# Patient Record
Sex: Female | Born: 1960
Health system: Southern US, Community
[De-identification: ages and names within clinical notes are randomized; demographics above are authoritative.]

## PROBLEM LIST (undated history)

## (undated) ENCOUNTER — Emergency Department (HOSPITAL_COMMUNITY): Payer: Medicare HMO

## (undated) DIAGNOSIS — M1990 Unspecified osteoarthritis, unspecified site: Secondary | ICD-10-CM

## (undated) DIAGNOSIS — R51 Headache: Secondary | ICD-10-CM

## (undated) DIAGNOSIS — R002 Palpitations: Secondary | ICD-10-CM

## (undated) DIAGNOSIS — Z9889 Other specified postprocedural states: Secondary | ICD-10-CM

## (undated) DIAGNOSIS — T66XXXA Radiation sickness, unspecified, initial encounter: Secondary | ICD-10-CM

## (undated) DIAGNOSIS — F329 Major depressive disorder, single episode, unspecified: Secondary | ICD-10-CM

## (undated) DIAGNOSIS — G8929 Other chronic pain: Secondary | ICD-10-CM

## (undated) DIAGNOSIS — Z87442 Personal history of urinary calculi: Secondary | ICD-10-CM

## (undated) DIAGNOSIS — D649 Anemia, unspecified: Secondary | ICD-10-CM

## (undated) DIAGNOSIS — F32A Depression, unspecified: Secondary | ICD-10-CM

## (undated) DIAGNOSIS — F419 Anxiety disorder, unspecified: Secondary | ICD-10-CM

## (undated) DIAGNOSIS — J45909 Unspecified asthma, uncomplicated: Secondary | ICD-10-CM

## (undated) DIAGNOSIS — M545 Low back pain, unspecified: Secondary | ICD-10-CM

## (undated) DIAGNOSIS — R112 Nausea with vomiting, unspecified: Secondary | ICD-10-CM

## (undated) DIAGNOSIS — R519 Headache, unspecified: Secondary | ICD-10-CM

## (undated) DIAGNOSIS — R011 Cardiac murmur, unspecified: Secondary | ICD-10-CM

## (undated) DIAGNOSIS — Z801 Family history of malignant neoplasm of trachea, bronchus and lung: Secondary | ICD-10-CM

## (undated) DIAGNOSIS — C801 Malignant (primary) neoplasm, unspecified: Secondary | ICD-10-CM

## (undated) DIAGNOSIS — Z9221 Personal history of antineoplastic chemotherapy: Secondary | ICD-10-CM

## (undated) DIAGNOSIS — Z803 Family history of malignant neoplasm of breast: Secondary | ICD-10-CM

## (undated) HISTORY — PX: CARPAL TUNNEL RELEASE: SHX101

## (undated) HISTORY — DX: Depression, unspecified: F32.A

## (undated) HISTORY — DX: Anxiety disorder, unspecified: F41.9

## (undated) HISTORY — DX: Family history of malignant neoplasm of breast: Z80.3

## (undated) HISTORY — PX: OTHER SURGICAL HISTORY: SHX169

## (undated) HISTORY — DX: Family history of malignant neoplasm of trachea, bronchus and lung: Z80.1

## (undated) HISTORY — DX: Major depressive disorder, single episode, unspecified: F32.9

---

## 1991-03-15 HISTORY — PX: TUBAL LIGATION: SHX77

## 1997-09-22 ENCOUNTER — Encounter: Admission: RE | Admit: 1997-09-22 | Discharge: 1997-09-22 | Payer: Self-pay | Admitting: Family Medicine

## 1997-09-27 ENCOUNTER — Emergency Department (HOSPITAL_COMMUNITY): Admission: EM | Admit: 1997-09-27 | Discharge: 1997-09-27 | Payer: Self-pay | Admitting: Emergency Medicine

## 1997-09-29 ENCOUNTER — Encounter: Admission: RE | Admit: 1997-09-29 | Discharge: 1997-09-29 | Payer: Self-pay | Admitting: Family Medicine

## 1997-10-31 ENCOUNTER — Encounter: Admission: RE | Admit: 1997-10-31 | Discharge: 1997-10-31 | Payer: Self-pay | Admitting: Family Medicine

## 1998-10-27 ENCOUNTER — Emergency Department (HOSPITAL_COMMUNITY): Admission: EM | Admit: 1998-10-27 | Discharge: 1998-10-27 | Payer: Self-pay | Admitting: Emergency Medicine

## 1998-10-27 ENCOUNTER — Encounter: Payer: Self-pay | Admitting: *Deleted

## 1998-11-02 ENCOUNTER — Encounter: Admission: RE | Admit: 1998-11-02 | Discharge: 1998-11-02 | Payer: Self-pay | Admitting: Family Medicine

## 1998-11-18 ENCOUNTER — Encounter: Admission: RE | Admit: 1998-11-18 | Discharge: 1998-11-18 | Payer: Self-pay | Admitting: Family Medicine

## 2001-12-08 ENCOUNTER — Emergency Department (HOSPITAL_COMMUNITY): Admission: EM | Admit: 2001-12-08 | Discharge: 2001-12-08 | Payer: Self-pay | Admitting: Emergency Medicine

## 2002-02-12 ENCOUNTER — Encounter: Admission: RE | Admit: 2002-02-12 | Discharge: 2002-05-07 | Payer: Self-pay | Admitting: Occupational Medicine

## 2002-04-26 ENCOUNTER — Encounter: Admission: RE | Admit: 2002-04-26 | Discharge: 2002-04-26 | Payer: Self-pay | Admitting: Family Medicine

## 2002-05-01 ENCOUNTER — Emergency Department (HOSPITAL_COMMUNITY): Admission: EM | Admit: 2002-05-01 | Discharge: 2002-05-02 | Payer: Self-pay | Admitting: Emergency Medicine

## 2002-05-02 ENCOUNTER — Encounter: Payer: Self-pay | Admitting: *Deleted

## 2002-05-06 ENCOUNTER — Ambulatory Visit (HOSPITAL_COMMUNITY): Admission: RE | Admit: 2002-05-06 | Discharge: 2002-05-06 | Payer: Self-pay | Admitting: *Deleted

## 2002-05-06 ENCOUNTER — Encounter: Payer: Self-pay | Admitting: *Deleted

## 2002-05-30 ENCOUNTER — Encounter (INDEPENDENT_AMBULATORY_CARE_PROVIDER_SITE_OTHER): Payer: Self-pay | Admitting: Specialist

## 2002-05-30 ENCOUNTER — Encounter: Admission: RE | Admit: 2002-05-30 | Discharge: 2002-05-30 | Payer: Self-pay | Admitting: Sports Medicine

## 2002-08-30 ENCOUNTER — Encounter: Admission: RE | Admit: 2002-08-30 | Discharge: 2002-08-30 | Payer: Self-pay | Admitting: Family Medicine

## 2002-09-03 ENCOUNTER — Encounter: Admission: RE | Admit: 2002-09-03 | Discharge: 2002-09-03 | Payer: Self-pay | Admitting: Family Medicine

## 2002-09-05 ENCOUNTER — Encounter: Admission: RE | Admit: 2002-09-05 | Discharge: 2002-09-05 | Payer: Self-pay | Admitting: Internal Medicine

## 2004-11-12 ENCOUNTER — Encounter (INDEPENDENT_AMBULATORY_CARE_PROVIDER_SITE_OTHER): Payer: Self-pay | Admitting: *Deleted

## 2004-12-07 ENCOUNTER — Ambulatory Visit: Payer: Self-pay | Admitting: Family Medicine

## 2005-07-13 ENCOUNTER — Encounter: Payer: Self-pay | Admitting: *Deleted

## 2006-05-11 DIAGNOSIS — E669 Obesity, unspecified: Secondary | ICD-10-CM | POA: Insufficient documentation

## 2006-05-11 DIAGNOSIS — J45909 Unspecified asthma, uncomplicated: Secondary | ICD-10-CM

## 2006-05-11 DIAGNOSIS — J309 Allergic rhinitis, unspecified: Secondary | ICD-10-CM | POA: Insufficient documentation

## 2006-05-12 ENCOUNTER — Encounter (INDEPENDENT_AMBULATORY_CARE_PROVIDER_SITE_OTHER): Payer: Self-pay | Admitting: *Deleted

## 2006-07-27 ENCOUNTER — Emergency Department (HOSPITAL_COMMUNITY): Admission: EM | Admit: 2006-07-27 | Discharge: 2006-07-28 | Payer: Self-pay | Admitting: Emergency Medicine

## 2007-04-27 ENCOUNTER — Ambulatory Visit: Payer: Self-pay | Admitting: Family Medicine

## 2007-04-27 ENCOUNTER — Observation Stay (HOSPITAL_COMMUNITY): Admission: EM | Admit: 2007-04-27 | Discharge: 2007-04-28 | Payer: Self-pay | Admitting: Emergency Medicine

## 2007-05-01 ENCOUNTER — Telehealth: Payer: Self-pay | Admitting: Family Medicine

## 2007-05-01 ENCOUNTER — Telehealth (INDEPENDENT_AMBULATORY_CARE_PROVIDER_SITE_OTHER): Payer: Self-pay | Admitting: *Deleted

## 2007-05-03 ENCOUNTER — Encounter: Payer: Self-pay | Admitting: *Deleted

## 2007-05-18 ENCOUNTER — Inpatient Hospital Stay (HOSPITAL_COMMUNITY): Admission: AD | Admit: 2007-05-18 | Discharge: 2007-05-22 | Payer: Self-pay | Admitting: *Deleted

## 2007-05-18 ENCOUNTER — Encounter: Payer: Self-pay | Admitting: Sports Medicine

## 2007-05-18 ENCOUNTER — Ambulatory Visit: Payer: Self-pay | Admitting: *Deleted

## 2007-07-25 ENCOUNTER — Telehealth: Payer: Self-pay | Admitting: Family Medicine

## 2008-05-23 ENCOUNTER — Ambulatory Visit: Payer: Self-pay | Admitting: Family Medicine

## 2008-05-29 ENCOUNTER — Ambulatory Visit (HOSPITAL_COMMUNITY): Admission: RE | Admit: 2008-05-29 | Discharge: 2008-05-29 | Payer: Self-pay | Admitting: Family Medicine

## 2008-09-16 ENCOUNTER — Telehealth: Payer: Self-pay | Admitting: Family Medicine

## 2008-09-16 ENCOUNTER — Telehealth (INDEPENDENT_AMBULATORY_CARE_PROVIDER_SITE_OTHER): Payer: Self-pay | Admitting: *Deleted

## 2008-09-16 ENCOUNTER — Ambulatory Visit: Payer: Self-pay | Admitting: Family Medicine

## 2008-09-16 ENCOUNTER — Ambulatory Visit (HOSPITAL_COMMUNITY): Admission: RE | Admit: 2008-09-16 | Discharge: 2008-09-16 | Payer: Self-pay | Admitting: Family Medicine

## 2008-12-12 ENCOUNTER — Ambulatory Visit: Payer: Self-pay | Admitting: Family Medicine

## 2008-12-17 ENCOUNTER — Encounter (INDEPENDENT_AMBULATORY_CARE_PROVIDER_SITE_OTHER): Payer: Self-pay | Admitting: *Deleted

## 2008-12-17 ENCOUNTER — Ambulatory Visit (HOSPITAL_COMMUNITY): Admission: RE | Admit: 2008-12-17 | Discharge: 2008-12-17 | Payer: Self-pay | Admitting: Family Medicine

## 2008-12-19 ENCOUNTER — Ambulatory Visit: Payer: Self-pay | Admitting: Family Medicine

## 2009-01-08 ENCOUNTER — Telehealth: Payer: Self-pay | Admitting: Family Medicine

## 2009-01-22 ENCOUNTER — Encounter: Admission: RE | Admit: 2009-01-22 | Discharge: 2009-02-19 | Payer: Self-pay | Admitting: Family Medicine

## 2009-01-30 ENCOUNTER — Ambulatory Visit: Payer: Self-pay | Admitting: Family Medicine

## 2009-02-19 ENCOUNTER — Encounter: Payer: Self-pay | Admitting: Family Medicine

## 2009-03-26 ENCOUNTER — Ambulatory Visit: Payer: Self-pay | Admitting: Sports Medicine

## 2009-06-22 ENCOUNTER — Ambulatory Visit: Payer: Self-pay | Admitting: Family Medicine

## 2009-06-22 DIAGNOSIS — F410 Panic disorder [episodic paroxysmal anxiety] without agoraphobia: Secondary | ICD-10-CM

## 2009-06-23 ENCOUNTER — Emergency Department (HOSPITAL_COMMUNITY): Admission: EM | Admit: 2009-06-23 | Discharge: 2009-06-23 | Payer: Self-pay | Admitting: Emergency Medicine

## 2009-06-24 ENCOUNTER — Ambulatory Visit: Payer: Self-pay | Admitting: Family Medicine

## 2009-07-08 ENCOUNTER — Ambulatory Visit: Payer: Self-pay | Admitting: Family Medicine

## 2009-12-11 ENCOUNTER — Encounter: Payer: Self-pay | Admitting: Family Medicine
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
## 2010-04-13 NOTE — Miscellaneous (Signed)
   Clinical Lists Changes  Problems: Changed problem from ASTHMA, UNSPECIFIED (ICD-493.90) to ASTHMA, INTERMITTENT (ICD-493.90) 

## 2010-04-13 NOTE — Assessment & Plan Note (Signed)
Summary: RT SHOULDER,MC   Vital Signs:  Patient profile:   50 year old female BP sitting:   142 / 98  Vitals Entered By: Lillia Pauls CMA (March 26, 2009 9:41 AM)  Primary Provider:  Marisue Ivan  MD   History of Present Illness: 49 yo F here for R shoulder pain.  Patient reports > 3 months of slowly worsening R shoulder pain Denies any acute injury + night pain especially rolling onto shoulder wakes her up No remote issues with this.  No h/o DM Range of motion has slowly worsened over this time Had x-rays showing osteophytes that could contribute to impingement syndrome. Tried tylenol and tramadol. States is compliant with ROM exercises Tried 3 visits of PT but states this caused too much pain and stopped Had subacromial injection that provided maybe 2 days of relief.  Allergies (verified): 1)  ! Aspirin  Physical Exam  General:  VS reviewed.  Well appearing, NAD Msk:  R shoulder: No gross deformity. Inspection: no obvious deformities, no swelling, ecchymosis, or erythema No TTP AC joint. ROM limited to 45 deg ER R compared to 90 deg L.  Only 130 deg flexion, 90 deg abduction - limited with passive motion and guarding to these as well. Pain with empty can.  Strength 5/5 resisted IR/ER. Positive Neers and Hawkins NV intact distally Additional Exam:  MSK u/s: Very difficult exam 2/2 body habitus.  No discernible rotator cuff tear but could not rule this out.  Biceps tendon intact with target sign (fluid around tendon).    After informed verbal consent, patient was seated on the exam table and right shoulder was prepped with alcohol swab.  Using sterile gel, under ultrasound guidance area inferior to acromion was injected via anterior approach with 4:1 marcaine to depomedrol.  Patient tolerated procedure without any immediate complications.   Impression & Recommendations:  Problem # 1:  SHOULDER PAIN, RIGHT (ICD-719.41) Assessment Deteriorated  Impingement +  adhesive capsulitis.  Unable to rule out rotator cuff tear based on body habitus making it difficult for adequate image quality of rotator cuff muscles.  Bursitis was visualized with fluid extending around long head of biceps tendon.  Ultrasound guidance used (see procedure note) to inject under acromion with success.  To continue with codman exercises.  Amitriptyline to help with sleep at bedtime.  F/u in 1 month for reevaluation.  Her updated medication list for this problem includes:    Tramadol Hcl 50 Mg Tabs (Tramadol hcl) .Marland Kitchen... Take 2 pills every 6 hours for pain    Tylenol Extra Strength 500 Mg Tabs (Acetaminophen) .Marland Kitchen... 2 tablet by mouth every 8 hours scheduled  Orders: Joint Aspirate / Injection, Large (20610)  Complete Medication List: 1)  Tramadol Hcl 50 Mg Tabs (Tramadol hcl) .... Take 2 pills every 6 hours for pain 2)  Tylenol Extra Strength 500 Mg Tabs (Acetaminophen) .... 2 tablet by mouth every 8 hours scheduled 3)  Ventolin Hfa 108 (90 Base) Mcg/act Aers (Albuterol sulfate) .... 2 puffs every 4 hours as needed for wheezing 4)  Amitriptyline Hcl 25 Mg Tabs (Amitriptyline hcl) .Marland Kitchen.. 1 tab by mouth at bedtime  Patient Instructions: 1)  Continue doing the Codman exercises (pendulums, wall walking, arm circles). 2)  Try amitriptyline at bedtime. 3)  Follow up with [place] in 1 month. Prescriptions: AMITRIPTYLINE HCL 25 MG TABS (AMITRIPTYLINE HCL) 1 tab by mouth at bedtime  #30 x 1   Entered and Authorized by:   Norton Blizzard MD  Signed by:   Norton Blizzard MD on 03/26/2009   Method used:   Print then Give to Patient   RxID:   [id number]

## 2010-04-13 NOTE — Assessment & Plan Note (Signed)
Summary: Anxiety- started on celexa 20mg    Vital Signs:  Patient profile:   50 year old female Height:      62 inches Weight:      276.0 pounds BMI:     50.66 Temp:     98.6 degrees F oral Pulse rate:   99 / minute BP sitting:   116 / 83  (left arm) Cuff size:   large  Vitals Entered By: Gladstone Pih (June 24, 2009 3:35 PM) CC: panic attacks, F/U ED Is Patient Diabetic? No Pain Assessment Patient in pain? no        Primary Care Provider:  Marisue Ivan  MD  CC:  panic attacks and F/U ED.  History of Present Illness: 50yo F here for f/u of anxiety attack  Anxiety: States that she was seen in clinic 2 days ago and prescribed buspar.  She tried the medication but it was not helpful.  She had another panic attack that night and went to the ER where she received Ativan and states she responded well to the Ativan.  Unsure of what triggers the panic attacks but it is affecting her function.  She reports chest discomfort, sob, feeling of "doom".  Allergic rhinitis: Worsened during this time of the year.  C/O rhinorrhea, itchy eyes, and sore throat with coughing.  Habits & Providers  Alcohol-Tobacco-Diet     Tobacco Status: never  Current Medications (verified): 1)  Tylenol Extra Strength 500 Mg Tabs (Acetaminophen) .... 2 Tablet By Mouth Every 8 Hours Scheduled 2)  Ventolin Hfa 108 (90 Base) Mcg/act Aers (Albuterol Sulfate) .... 2 Puffs Every 4 Hours As Needed For Wheezing 3)  Loratadine 10 Mg Tabs (Loratadine) .... Take One Tab By Mouth Daily 4)  Citalopram Hydrobromide 20 Mg Tabs (Citalopram Hydrobromide) .Marland Kitchen.. 1 Tab By Mouth Daily 5)  Ativan 1 Mg Tabs (Lorazepam) .... 1/2 Tab By Mouth Every 8 Hours As Needed For Anxiety 6)  Flonase 50 Mcg/act Susp (Fluticasone Propionate) .Marland Kitchen.. 1 Spray Each Nostril Daily  Allergies (verified): 1)  ! Aspirin  Review of Systems      See HPI  Physical Exam  General:  VS Reviewed. Well appearing, NAD.  Psych:  Pt does not appear  anxious A&O x3 good eye contact no psychomotor activity   Impression & Recommendations:  Problem # 1:  PANIC ATTACK (ICD-300.01) Assessment Unchanged  Hx and exam c/w anxiety with breakthrough panic attacks. Plan to start on Celexa 20mg  daily with ativan for bridging with plans to stop the Ativan in 2 weeks. Will f/u in 2 weeks.  The following medications were removed from the medication list:    Amitriptyline Hcl 25 Mg Tabs (Amitriptyline hcl) .Marland Kitchen... 1 tab by mouth at bedtime    Buspirone Hcl 10 Mg Tabs (Buspirone hcl) .Marland Kitchen... Take 1/2 tab by mouth three times a day for anxiety Her updated medication list for this problem includes:    Citalopram Hydrobromide 20 Mg Tabs (Citalopram hydrobromide) .Marland Kitchen... 1 tab by mouth daily    Ativan 1 Mg Tabs (Lorazepam) .Marland Kitchen... 1/2 tab by mouth every 8 hours as needed for anxiety  Orders: [facility]- Est Level  3 (62831)  Problem # 2:  RHINITIS, ALLERGIC (ICD-477.9) Assessment: Deteriorated  Symptoms currently worsen. Will add flonase to her regimen.  Her updated medication list for this problem includes:    Loratadine 10 Mg Tabs (Loratadine) .Marland Kitchen... Take one tab by mouth daily    Flonase 50 Mcg/act Susp (Fluticasone propionate) .Marland Kitchen... 1 spray each nostril  daily  Orders: [facility]- Est Level  3 (99213)  Problem # 3:  ROTATOR CUFF SYNDROME (ICD-726.10) Assessment: Improved States that she did not f/u with Sports Med b/c her symptoms resolved.  Complete Medication List: 1)  Tylenol Extra Strength 500 Mg Tabs (Acetaminophen) .... 2 tablet by mouth every 8 hours scheduled 2)  Ventolin Hfa 108 (90 Base) Mcg/act Aers (Albuterol sulfate) .... 2 puffs every 4 hours as needed for wheezing 3)  Loratadine 10 Mg Tabs (Loratadine) .... Take one tab by mouth daily 4)  Citalopram Hydrobromide 20 Mg Tabs (Citalopram hydrobromide) .Marland Kitchen.. 1 tab by mouth daily 5)  Ativan 1 Mg Tabs (Lorazepam) .... 1/2 tab by mouth every 8 hours as needed for anxiety 6)  Flonase 50 Mcg/act Susp  (Fluticasone propionate) .Marland Kitchen.. 1 spray each nostril daily  Patient Instructions: 1)  Please schedule a follow-up appointment in 2 weeks to reassess anxiety. 2)  I started you on celexa today and also continue the ativan for a short period of time. 3)  I also started you back on flonase. Prescriptions: FLONASE 50 MCG/ACT SUSP (FLUTICASONE PROPIONATE) 1 spray each nostril daily  #1 x 1   Entered and Authorized by:   Marisue Ivan  MD   Signed by:   Marisue Ivan  MD on 06/24/2009   Method used:   Print then Give to Patient   RxID:   [id number] ATIVAN 1 MG TABS (LORAZEPAM) 1/2 tab by mouth every 8 hours as needed for anxiety  #30 x 0   Entered and Authorized by:   Marisue Ivan  MD   Signed by:   Marisue Ivan  MD on 06/24/2009   Method used:   Print then Give to Patient   RxID:   [id number] CITALOPRAM HYDROBROMIDE 20 MG TABS (CITALOPRAM HYDROBROMIDE) 1 tab by mouth daily  #30 x 1   Entered and Authorized by:   Marisue Ivan  MD   Signed by:   Marisue Ivan  MD on 06/24/2009   Method used:   Print then Give to Patient   RxID:   [id number]
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
## 2010-04-26 ENCOUNTER — Encounter: Payer: Self-pay | Admitting: *Deleted

## 2010-07-27 NOTE — H&P (Signed)
[name], [name]   MEDICAL RECORD NO.:  [id number]          PATIENT TYPE:  IPS   LOCATION:  0307                          FACILITY:  [facility]   PHYSICIAN:  Jasmine Pang, M.D. DATE OF BIRTH:  [birth year]   DATE OF ADMISSION:  05/18/2007  DATE OF DISCHARGE:                       PSYCHIATRIC ADMISSION ASSESSMENT   NOTE:  This is a voluntary admission to the services of Jasmine Pang, M.D.  Today's date is May 19, 2007.   IDENTIFYING INFORMATION:  This is a 50 year old married [place] American  female who presented yesterday reporting that she was having suicidal  ideation.  She states that on February 13 she received a call from a  teenager whom the patient helped raise.  This is a neighborhood child.  She lives 2 doors up.  The caller reported that she had had an affair  with her husband for the last several years.  He does have a history for  infidelity.  Indeed, this is how she and her husband met.  They have  been married 33 years.  He was previously married to her sister and she  began a relationship with him at age 29.  The patient reports she is  having difficulty adjusting to all this information.  She kicked him out  of the home yesterday.  She reported yesterday she was having suicidal  ideation with thoughts of overdosing.  She has had chest pain.  Her  cardiac labs were abnormal.  Further tests were pending.  She had not  actually harmed herself.  The patient was actually admitted on February  13 and February 14.  She was having chest pain at that time.  She has a  very positive family history for coronary artery disease and the  anniversary of her mother's death was pending.  Her mother's anniversary  was actually February 16.  She said that she was having tightness while  untying her shoe.  It was not relieved by rest.  It was not aggravated  by anything.  It came and went.  When she was hospitalized her workup  proved to be  atypical cardiac chest pain and it was resolved and it was  unlikely to be cardiac in nature.   PAST PSYCHIATRIC HISTORY:  She does not have any.  She denies any prior  therapy or treatment.  She prefers to not take medication if at all  possible.   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  She is studying to get a degree in health care  administration.  She lives with her husband of 33 years.  Three  children, ages 40, 59 and 30 and the two older ones are also at college.  She does not smoke.  She does not use drugs or alcohol.  She is a  M[employer]on.   FAMILY HISTORY:  Her father died at age 79 of emphysema.  He had  diabetes and congestive heart failure as well.  Her mother died of lung  cancer at  43.  Her mother also had an MI at age 51 and also was known to  have high blood pressure.  She has one sister who has had breast cancer  and also had angioplasty in her 59s.   ALCOHOL AND DRUG HISTORY:  She reports that there are many weekend  alcoholics in the family.   PRIMARY CARE PHYSICIAN:  Her primary care Kristina Rivera is Kristina Rivera,  M.D. at [facility].   MEDICATIONS:  She does have an albuterol inhaler p.r.n.  She has no  other prescribed medications.   DRUG ALLERGIES:  ASPIRIN gives her hives.  NSAIDS give her hives and  anaphylaxis.   PHYSICAL EXAMINATION:  GENERAL:  She is a well-developed, well-nourished  obese African American female who appears her stated age of 77.  VITAL SIGNS:  Shows she is 5 feet 2 inches, weighs 260.  Temperature is  98.8, blood pressure 117/85 to 125/77, pulse is 80-94, respirations are  20.   Today she denies any acute symptoms.  She is status post a tubal  ligation 15 years ago, carpal tunnel release 15 years ago.  She states  she tries to use relaxation therapy when she has an asthma attack.   Labs were not repeated today as she is status post a recent stress test  and  recent labs.  Her lab work yesterday was noncardiac in nature.   MENTAL STATUS EXAM:  Today she is alert and oriented.  She is casually  groomed and dressed in her pajamas.  She appears to be adequately  nourished.  Her speech is of normal rate, rhythm and tone.  Her mood is  depressed.  Her affect is congruent.  Thought processes are clear,  rational and goal oriented.  She just wants to get through the pain.  Judgment and insight are good.  Concentration and memory are good.  Intelligence is at least average.  She is not actively suicidal but she  is afraid that she would still do anything to get rid of this pain,  emotional pain.  She is not homicidal.  She does not have auditory  visual hallucinations.   AXIS I.  Adjustment disorder with depressed mood.  AXIS II.  Deferred.  AXIS III.  Obesity, asthma, noncardiac atypical chest pain, history for  iron deficiency anemia.  AXIS IV.  Severe marital issues.  Husband has been sexually active with  a now 51 year old but it started when she was 4.  AXIS V.  29.   PLAN:  The plan is to admit for safety and stabilization.  We will start  an antianxiety/antidepressant.  Toward that end we will start Lexapro 10  mg p.o. q. a.m.  We will allow her to have Neurontin 100 mg p.o. q.3 h.  p.r.n. anxiety.  We will have the counselor speak with her about setting  up therapy once discharged.  Estimated length of stay is 3 days.      Kristina Rivera, P.A.-C.      Jasmine Pang, M.D.  Electronically Signed    MD/MEDQ  D:  05/19/2007  T:  05/19/2007  Job:  [id number]

## 2010-07-27 NOTE — Discharge Summary (Signed)
NAMEJACARRA, BOBAK                ACCOUNT NO.:  [id number]   MEDICAL RECORD NO.:  [id number]          PATIENT TYPE:  INP   LOCATION:  4707                         FACILITY:  [facility]   PHYSICIAN:  Leighton Roach McDiarmid, M.D.DATE OF BIRTH:  Jun 19, 1960   DATE OF ADMISSION:  04/27/2007  DATE OF DISCHARGE:  04/28/2007                               DISCHARGE SUMMARY   DISCHARGE DIAGNOSES:  1. Atypical chest pain, resolved, unlikely cardiac in nature.  2. Asthma.  3. Hypertension.  4. History of iron deficiency anemia.   DISCHARGE MEDICATIONS:  1. Albuterol as needed.  2. Hydrochlorothiazide 12.5 mg one tablet p.o. daily.  3. Ferrous sulfate 300 mg p.o. b.i.d.   HOSPITAL COURSE:  Problem 1.  Chest pain.  For complete history, please  see dictated H&P.  Briefly, this is a 50 year old female with no prior  history of coronary artery disease, but who had a strong family history  of coronary artery disease, who presented to the ED after one-day  history of intermittent chest pain not relieved or aggravated by  anything.  Reports the chest pain was chest tightness in nature and  radiates to her left arm.  We admitted her to rule her out for MI.  Cardiac enzymes were negative.  EKG did show some nonspecific T wave  changes.  We have risk stratified her and her fasting lipid panel was  within normal limits.  TSH was also normal.  At discharge, chest pain  had completely resolved.  There may have been some component of anxiety  as this was her mother's birthday who recently died and she was very  anxious about this anniversary.   Problem 2.  Hypertension.  Prior to this admission, the patient was not  hypertensive.  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  We started a beta blocker on admission and her blood  pressure came down.  As she is reliable in taking her own blood  pressure, I gave her a prescription for hydrochlorothiazide and told her  to measure her  blood pressure at home over the next few days and if it  still remains elevated, she will start taking hydrochlorothiazide.   Problem 3.  Asthma.  Remained stable throughout admission and required  no albuterol.   DISPOSITION:  The patient is to follow up with Marisue Ivan, M.D.  at the [facility].  She is to make her own appointment. We  will make an appointment for her to have an exercise stress test at the  [facility] center.  We will call patient with details.      Ruthe Mannan, M.D.  Electronically Signed      Leighton Roach McDiarmid, M.D.  Electronically Signed    TA/MEDQ  D:  04/28/2007  T:  04/30/2007  Job:  [id number]

## 2010-07-27 NOTE — H&P (Signed)
Kristina Rivera, Kristina Rivera                ACCOUNT NO.:  [id number]   MEDICAL RECORD NO.:  [id number]          PATIENT TYPE:  EMS   LOCATION:  MAJO                         FACILITY:  [facility]   PHYSICIAN:  Leighton Roach McDiarmid, M.D.DATE OF BIRTH:  29-Jul-1960   DATE OF ADMISSION:  04/27/2007  DATE OF DISCHARGE:  04/27/2007                              HISTORY & PHYSICAL   CHIEF COMPLAINT:  Chest pain.   HISTORY OF PRESENT ILLNESS:  [name] is a very pleasant 50 year old  female with a history of asthma and positive family history of CAD, who  present to the ED with a one-day history of intermittent chest  tightness.  She describes the pain as substernal, radiating to left  shoulder.  First felt the tightness while untying her shoe, not relieved  by rest, not aggravated by anything.  It comes and goes over 15 to 20  minutes.  It is associates with shortness of breath but denies any  nausea or diaphoresis.  No nitroglycerin given.  Denies any wheezing,  fevers, or recent illness.  She has a history of mild intermittent  asthma requiring albuterol inhaler, one to three times per month.  She  has never admitted intubated for her asthma.  She also has no history of  hypertension but ED reports a blood pressure of  177/110.   PAST MEDICAL HISTORY:  1. Asthma.  2. Carpal tunnel  3. Remote history of anemia.  4. Seasonal allergies.   FAMILY HISTORY:  Her father died at 73 of emphysema, had diabetes and  congestive heart failure as well.  Mother died of lung cancer at 58  years old, but her mother had an MI at 48 years of age, also had high  blood pressure.  Sister has breast cancer but also had an angioplasty  her 66s.   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  She lives  with husband, three children ages 35, 43 and 35.  Two of her kids are at  college.  Never smoked cigarettes.  No EtOH and no illicit  drugs.   MEDICATIONS:  She is on albuterol as needed.   ALLERGIES:  ASPIRIN GIVES HER HIVES, NSAIDS GIVE HER HIVES AND  ANAPHYLAXIS.   REVIEW OF SYSTEMS:  GENERAL : No fevers.  RESPIRATORY:  No wheezes, but  she does endorse some shortness of breath.  ABDOMINAL:  No nausea,  vomiting, diarrhea, or abdominal pain.   LABS:  Sodium 138, potassium at 4.2, Hemoccult negative, INR 0.9, point  of care enzymes negative, glucose of 95.  EKG does shows some flipped T-  waves in V4, V5 and V6, change from prior EKG in October 19, 1998.   Chest x-ray shows no acute changes, question of cardiomegaly.   PHYSICAL EXAM:  VITAL SIGNS:  Temperature is 97.7, pulse is 70,  respiratory rate is 18, blood pressure is 151/80.  Oxygen saturations on  room air.  GENERAL:  She is obese, pleasant, in no acute distress, no  increased work or breathing.  RESPIRATORY:  Occasional expiratory  wheezes.  Decreased breath sounds at bases, but no crackles.  CARDIOVASCULAR:  Regular rate and rhythm.  ABDOMEN: Obese, soft,  nontender.  EXTREMITIES:  No edema.  RECTAL:  Louque stool in vault,  hemoccult negative.   ASSESSMENT/PLAN:  Is a 50 year old female with:  1. Chest pain worrisome for cardiac process, given possible family      history of coronary disease and EKG changes.  Will admit to rule      out myocardial infarction, point of care enzymes negative.  Will      cycle cardiac enzymes q.8 h. and risk stratify with a fasting lipid      panel and TSH.  Will all check CBC, as she does have a history of      anemia.  Given allergy to aspirin, will start Plavix.  2. Asthma, currently stable.  Will write for p.r.n. albuterol, but not      currently wheezing.  3. Prophylaxis.  Will start Protonix along with heparin, once we      verify she is hemoccult negative.  INR is currently 0.9.  FEN/GI,      N.P.O for now, KVO intravenous fluids.   DISPOSITION:  Pending #1, likely discharge in the morning, once we rule  out  for MI.      Ruthe Mannan, M.D.  Electronically Signed      Leighton Roach McDiarmid, M.D.  Electronically Signed    TA/MEDQ  D:  04/27/2007  T:  04/29/2007  Job:  [id number]

## 2010-07-30 NOTE — Discharge Summary (Signed)
Kristina Rivera, Kristina Rivera NO.:  [id number]   MEDICAL RECORD NO.:  [id number]          PATIENT TYPE:  IPS   LOCATION:  0307                          [facility]ILITY[facility]  [facility]   PHYSICIAN:  Jasmine Pang, M.D. DATE OF BIRTH:  Aug 12, 1960   DATE OF ADMISSION:  05/18/2007  DATE OF DISCHARGE:  05/22/2007                               DISCHARGE SUMMARY   IDENTIFICATION:  This is a voluntary admission for this 50 year old  married African American female who was admitted on May 18, 2007.   HISTORY OF PRESENT ILLNESS:  The patient presented on the day before  admission reporting that she was having suicidal ideation.  She states  that on April 27, 2007, she received a call from a teenager whom the  patient helped raise.  This is the neighborhood child.  She lives 2  doors up.  The caller reported that she has had an affair with her  husband for the last several years.  He does have a history of  infidelity according to the patient.  Indeed, this is how she and her  husband met.  They have been married 33 years.  He was previously  married to her sister and she began a relationship with him at age 76.  The patient reports she is having difficulty adjusting to all this  information.  She kicked him out of the home on the day before  admission.  She reported yesterday that she was having suicidal ideation  with thoughts of overdosing.  She has had chest pain.  Her cardiac labs  are abnormal.  Further tests are pending.  She has not actually harmed  herself.   PAST PSYCHIATRIC HISTORY:  She denies any prior therapy or treatment.  She prefers not to take medications if possible.   FAMILY HISTORY:  The patient reports there are many alcoholics in her  family.   MEDICAL PROBLEMS:  History of chest pain and discomfort as indicated  above.   MEDICATION:  She does have an albuterol inhaler p.r.n.  She has no other  prescribed medications.   DRUG ALLERGIES:  ASPIRIN gives her hives.   NSAIDs give her hives and  anaphylaxis.   PHYSICAL FINDINGS:  The patient is a well-developed, well-nourished,  obese [place] American female, who appears her stated age of 78.  She  had no acute physical or medical problems noted other than some  complaints of chest discomfort.   ADMISSION LABORATORIES:  Not repeated today as she is status post recent  stress test and recent labs.  Her lab work yesterday was noncardiac in  nature.   HOSPITAL COURSE:  Upon admission, the patient was placed on albuterol  inhaler p.r.n. shortness of breath.  She was also started on Lexapro 10  mg p.o. q. day and Neurontin 100 mg p.o. q.3 h. p.r.n. anxiety.  In  individual sessions, the patient was friendly and cooperative.  She  initially had suicidal ideation, felt hopeless, but no plan.  She needed  encouragement to go to the group therapies, but did participate  appropriately in unit therapeutic groups  and activities.  As  hospitalization progressed, her mental status improved.  Sleep was  improving.  Appetite improved.  She became less depressed and less  anxious.  There was no suicidal ideation.  She discussed her husband's  affair, which led to her feeling suicidal.  This is resolved.  She  stated she wanted to scale back work.  She is currently in school and  working 2 jobs.  She feels that she scales if she decreases her for work  load.  This will help a lot with her depression.  On May 22, 2007, her  mood was less depressed and less anxious.  Affect, consistent with mood.  There was no suicidal or homicidal ideation.  No auditory or visual  hallucinations.  No thoughts of self-injurious behavior.  No paranoia or  delusions.  Thoughts were logical and goal-directed.  Thought content,  no predominant theme.  Cognitive was grossly back to baseline.  The  patient felt ready to go home.  She did have a family session with her  husband.  She felt hopeful about working things out with husband.   Her  husband states he has set up counseling for himself and is glad to have  the opportunity to work on his issues of infidelity.  The patient wanted  husband to back her up more when she was trying to get the children to  do something.  Husband described a lot of irritability on the part of  the patient at home.  They brought in the patient's 43 year old daughter  to the session.  She was very supportive of her mother.  She felt  hopeful about her family being okay and the patient felt ready to be  discharged and was felt safe to go home.   DISCHARGE DIAGNOSES:  Axis I:  Depressive disorder, not otherwise  specified.  Axis II:  None.  Axis III:  Obesity, asthma, noncardiac atypical chest pain, history of  iron deficiency anemia.  Axis IV:  Severe (marital issues, burden of psychiatric illness, burden  of medical problems).  Axis V:  Global assessment of functioning was 50 upon discharge.  GAF  was 30 upon admission.  GAF was 70-75 highest past year.   DISCHARGE PLAN:  There is no specific activity level or dietary  restrictions.   POSTHOSPITAL CARE PLANS:  The patient will be seen at the [facility] on June 04, 2007, at 1:20 p.m.   DISCHARGE MEDICATIONS:  1. Lexapro 10 mg daily.  2. Ambien 10 mg at bedtime as needed.      Jasmine Pang, M.D.  Electronically Signed     BHS/MEDQ  D:  06/09/2007  T:  06/10/2007  Job:  [id number]

## 2010-12-03 LAB — COMPREHENSIVE METABOLIC PANEL
ALT: 15
AST: 20
Albumin: 3.5
Calcium: 9
Sodium: 134 — ABNORMAL LOW
Total Protein: 7.6

## 2010-12-03 LAB — DIFFERENTIAL
Basophils Relative: 1
Eosinophils Absolute: 0.3
Monocytes Absolute: 0.5
Monocytes Relative: 7
Neutro Abs: 3.9

## 2010-12-03 LAB — CBC
MCHC: 31.6
MCHC: 31.6
MCV: 70.1 — ABNORMAL LOW
MCV: 70.2 — ABNORMAL LOW
Platelets: 362
Platelets: 380
RDW: 15.5
WBC: 8.4

## 2010-12-03 LAB — I-STAT 8, (EC8 V) (CONVERTED LAB)
BUN: 10
Chloride: 105
HCT: 41
Hemoglobin: 13.9
Operator id: 234501
Sodium: 138

## 2010-12-03 LAB — APTT: aPTT: 28

## 2010-12-03 LAB — LIPID PANEL
HDL: 44
Total CHOL/HDL Ratio: 3.5
Triglycerides: 75

## 2010-12-03 LAB — POCT CARDIAC MARKERS
Myoglobin, poc: 69.1
Operator id: 234501

## 2010-12-03 LAB — CK TOTAL AND CKMB (NOT AT ARMC): Total CK: 98

## 2010-12-03 LAB — BASIC METABOLIC PANEL
GFR calc non Af Amer: >60
Glucose, Bld: 86
Potassium: 3.9
Sodium: 135

## 2010-12-03 LAB — CARDIAC PANEL(CRET KIN+CKTOT+MB+TROPI)
CK, MB: 1.6
Total CK: 100
Total CK: 87

## 2010-12-03 LAB — HEMOGLOBIN A1C: Mean Plasma Glucose: 151

## 2011-01-05 ENCOUNTER — Encounter: Payer: Self-pay | Admitting: Family Medicine

## 2011-02-10 ENCOUNTER — Ambulatory Visit (INDEPENDENT_AMBULATORY_CARE_PROVIDER_SITE_OTHER): Payer: BC Managed Care – PPO | Admitting: Family Medicine

## 2011-02-10 ENCOUNTER — Encounter: Payer: Self-pay | Admitting: Family Medicine

## 2011-02-10 VITALS — BP 145/89 | HR 88 | Temp 98.5°F | Ht 62.0 in | Wt 275.0 lb

## 2011-02-10 DIAGNOSIS — M25561 Pain in right knee: Secondary | ICD-10-CM | POA: Insufficient documentation

## 2011-02-10 DIAGNOSIS — M13169 Monoarthritis, not elsewhere classified, unspecified knee: Secondary | ICD-10-CM

## 2011-02-10 DIAGNOSIS — M25569 Pain in unspecified knee: Secondary | ICD-10-CM

## 2011-02-10 MED ORDER — HYDROCODONE-ACETAMINOPHEN 5-500 MG PO TABS: 1.0000 | ORAL_TABLET | Freq: Every day | ORAL | Status: DC

## 2011-02-10 NOTE — Progress Notes (Signed)
  Subjective:    Kristina Rivera is a 50 y.o. female who presents for follow up on a knee problem involving the right knee. Onset was 3 months ago , 2012. Inciting event: twisting injury while falling. Current symptoms include: locking, pain located mainly in thigh muscles above and knee, stiffness and swelling. Pain is aggravated by rising after sitting and walking. Patient has had no prior knee problems. Evaluation to date: none. Treatment to date: ice and OTC analgesics which are ineffective. Allergic to NSAIDS.   Review of Systems Pertinent items are noted in HPI.   Objective:    BP 145/89  Pulse 88  Temp(Src) 98.5 F (36.9 C) (Oral)  Ht 5\' 2"  (1.575 m)  Wt 275 lb (124.739 kg)  BMI 50.30 kg/m2 Right knee: positive exam findings: tenderness noted thigh and when moving knee and ROM limited to approximately 100 degrees and negative exam findings: no effusion, no erythema, ACL stable, PCL stable, MCL stable, LCL stable, no patellar laxity and no crepitus  Left knee:  normal and no effusion, full active range of motion, no joint line tenderness, ligamentous structures intact.   X-ray : not indicated    Assessment:    Right Moderate knee sprain on the right    Plan:    sports medicine referral  vicodin

## 2011-02-10 NOTE — Assessment & Plan Note (Signed)
4 months of off and on pain with knee locking. Twisting injury 4 months ago. Continuous to lift heavy objects at work.  Vicodin for pain - allergic to NSAIDS - angioedema Injection of right knee from lateral midpatellar approach with Kenalog and Lidocaine 1:4  Referral to sports medicine Will await referral before ordering MRI

## 2011-02-10 NOTE — Patient Instructions (Signed)
Arthritis, Nonspecific  Arthritis is inflammation of a joint. This usually means pain, redness, warmth or swelling are present. One or more joints may be involved. There are a number of types of arthritis. Your caregiver may not be able to tell what type of arthritis you have right away.  CAUSES   The most common cause of arthritis is the wear and tear on the joint (osteoarthritis). This causes damage to the cartilage, which can break down over time. The knees, hips, back and neck are most often affected by this type of arthritis.  Other types of arthritis and common causes of joint pain include:  · Sprains and other injuries near the joint. Sometimes minor sprains and injuries cause pain and swelling that develop hours later.   · Rheumatoid arthritis. This affects hands, feet and knees. It usually affects both sides of your body at the same time. It is often associated with chronic ailments, fever, weight loss and general weakness.   · Crystal arthritis. Gout and pseudo gout can cause occasional acute severe pain, redness and swelling in the foot, ankle, or knee.   · Infectious arthritis. Bacteria can get into a joint through a break in overlying skin. This can cause infection of the joint. Bacteria and viruses can also spread through the blood and affect your joints.   · Drug, infectious and allergy reactions. Sometimes joints can become mildly painful and slightly swollen with these types of illnesses.   SYMPTOMS   · Pain is the main symptom.   · Your joint or joints can also be red, swollen and warm or hot to the touch.   · You may have a fever with certain types of arthritis, or even feel overall ill.   · The joint with arthritis will hurt with movement. Stiffness is present with some types of arthritis.   DIAGNOSIS   Your caregiver will suspect arthritis based on your description of your symptoms and on your exam. Testing may be needed to find the type of arthritis:  · Blood and sometimes urine tests.    · X-ray tests and sometimes CT or MRI scans.   · Removal of fluid from the joint (arthrocentesis) is done to check for bacteria, crystals or other causes. Your caregiver (or a specialist) will numb the area over the joint with a local anesthetic, and use a needle to remove joint fluid for examination. This procedure is only minimally uncomfortable.   · Even with these tests, your caregiver may not be able to tell what kind of arthritis you have. Consultation with a specialist (rheumatologist) may be helpful.   TREATMENT   Your caregiver will discuss with you treatment specific to your type of arthritis. If the specific type cannot be determined, then the following general recommendations may apply.  Treatment of severe joint pain includes:  · Rest.   · Elevation.   · Anti-inflammatory medication (for example, ibuprofen) may be prescribed. Avoiding activities that cause increased pain.   · Only take over-the-counter or prescription medicines for pain and discomfort as recommended by your caregiver.   · Cold packs over an inflamed joint may be used for 10 to 15 minutes every hour. Hot packs sometimes feel better, but do not use overnight. Do not use hot packs if you are diabetic without your caregiver's permission.   · A cortisone shot into arthritic joints may help reduce pain and swelling.   · Any acute arthritis that gets worse over the next 1 to 2   days needs to be looked at to be sure there is no joint infection.   Long-term arthritis treatment involves modifying activities and lifestyle to reduce joint stress jarring. This can include weight loss. Also, exercise is needed to nourish the joint cartilage and remove waste. This helps keep the muscles around the joint strong.  HOME CARE INSTRUCTIONS   · Do not take aspirin to relieve pain if gout is suspected. This elevates uric acid levels.   · Only take over-the-counter or prescription medicines for pain, discomfort or fever as directed by your caregiver.    · Rest the joint as much as possible.   · If your joint is swollen, keep it elevated.   · Use crutches if the painful joint is in your leg.   · Drinking plenty of fluids may help for certain types of arthritis.   · Follow your caregiver's dietary instructions.   · Try low-impact exercise such as:   · Swimming.   · Water aerobics.   · Biking.   · Walking.   · Morning stiffness is often relieved by a warm shower.   · Put your joints through regular range-of-motion.   SEEK MEDICAL CARE IF:   · You do not feel better in 24 hours or are getting worse.   · You have side effects to medications, or are not getting better with treatment.   SEEK IMMEDIATE MEDICAL CARE IF:   · You have a fever.   · You develop severe joint pain, swelling or redness.   · Many joints are involved and become painful and swollen.   · There is severe back pain and/or leg weakness.   · You have loss of bowel or bladder control.   Document Released: 04/07/2004 Document Revised: 11/10/2010 Document Reviewed: 04/23/2008  [employer]® Patient Information ©2012 [employer].

## 2011-02-11 NOTE — Progress Notes (Signed)
Procedure Note:  Informed Consent Obtained for injection of right knee with steroid and lidocaine. All Risks, benefits, alternative, complications reviewed with patient and understood. Time Out performed. Using anesthetic spray, local anesthesia was obtained. Using a 25 gauge needle a lateral midpatellar approach was used to gain access to the joint space. 4 cc's of compound injected. The patient tolerated the procedure well. No complications occurred. Patient counseled on post-operative care.  Dr. Perley Jain assisted with procedure.

## 2011-02-15 ENCOUNTER — Ambulatory Visit (INDEPENDENT_AMBULATORY_CARE_PROVIDER_SITE_OTHER): Payer: BC Managed Care – PPO | Admitting: Family Medicine

## 2011-02-15 ENCOUNTER — Encounter: Payer: Self-pay | Admitting: Family Medicine

## 2011-02-15 ENCOUNTER — Other Ambulatory Visit (HOSPITAL_COMMUNITY)
Admission: RE | Admit: 2011-02-15 | Discharge: 2011-02-15 | Disposition: A | Discharge status: Home / Self Care | Payer: BC Managed Care – PPO | Source: Ambulatory Visit | Attending: Family Medicine | Admitting: Family Medicine

## 2011-02-15 VITALS — BP 135/78 | HR 67 | Temp 98.6°F | Ht 62.0 in | Wt 277.5 lb

## 2011-02-15 DIAGNOSIS — Z1239 Encounter for other screening for malignant neoplasm of breast: Secondary | ICD-10-CM

## 2011-02-15 DIAGNOSIS — Z124 Encounter for screening for malignant neoplasm of cervix: Secondary | ICD-10-CM

## 2011-02-15 DIAGNOSIS — Z01419 Encounter for gynecological examination (general) (routine) without abnormal findings: Secondary | ICD-10-CM | POA: Insufficient documentation

## 2011-02-15 DIAGNOSIS — Z0000 Encounter for general adult medical examination without abnormal findings: Secondary | ICD-10-CM

## 2011-02-15 DIAGNOSIS — Z1211 Encounter for screening for malignant neoplasm of colon: Secondary | ICD-10-CM

## 2011-02-15 DIAGNOSIS — E669 Obesity, unspecified: Secondary | ICD-10-CM

## 2011-02-15 LAB — LDL CHOLESTEROL, DIRECT: Direct LDL: 95 mg/dL

## 2011-02-15 LAB — CBC
HCT: 38.1 % (ref 36.0–46.0)
Hemoglobin: 11.9 g/dL — ABNORMAL LOW (ref 12.0–15.0)
MCH: 22 pg — ABNORMAL LOW (ref 26.0–34.0)
MCHC: 31.2 g/dL (ref 30.0–36.0)
RBC: 5.41 MIL/uL — ABNORMAL HIGH (ref 3.87–5.11)

## 2011-02-15 LAB — COMPREHENSIVE METABOLIC PANEL
Albumin: 3.9 g/dL (ref 3.5–5.2)
Alkaline Phosphatase: 78 U/L (ref 39–117)
BUN: 11 mg/dL (ref 6–23)
CO2: 29 mEq/L (ref 19–32)
Glucose, Bld: 82 mg/dL (ref 70–99)
Potassium: 4.1 mEq/L (ref 3.5–5.3)
Total Protein: 7.6 g/dL (ref 6.0–8.3)

## 2011-02-15 NOTE — Patient Instructions (Signed)
It was great to see you today! Make sure to keep your sports medicine appointment. I will call you if you have abnormal labs, otherwise i will send you a letter. You will be contacted about a colonoscopy.  You will need to call to schedule your mammogram.

## 2011-02-15 NOTE — Progress Notes (Signed)
Subjective: Pt reports that, with pain meds, knee pain is better.  Has appointment at sports med on the 10th of this month.  Some constipation since taking pain meds.  No SOB, CP, visual changes, headaches.  Medical, social, and family history reviewed and updated as appropriate.  ROS performed and was unremarkable except as noted above.  Objective:  Filed Vitals:   02/15/11 1048  BP: 135/78  Pulse: 67  Temp: 98.6 F (37 C)   Gen: NAD CV: RRR Resp: CTABL Abd: SNTND, no adnexal fullness, uterus not grossly enlarged Pelvic: Normal external genitalia, normal vaginal mucosa, normal cervix without friability, pap performed without significant bleeding.  Bimanual exam WNL, although exam is somewhat limited by habitus Ext: 2+ pulses, no swelling of either knee, no pain on palpation.  Assessment/Plan: Keep appointment with sports med Screening labs, mammo, colonoscopy rtc 1 year  Please also see individual problems in problem list for problem-specific plans.

## 2011-02-16 ENCOUNTER — Encounter (HOSPITAL_COMMUNITY): Payer: Self-pay | Admitting: Family Medicine

## 2011-02-17 ENCOUNTER — Ambulatory Visit: Payer: BC Managed Care – PPO | Admitting: Sports Medicine

## 2011-02-17 ENCOUNTER — Other Ambulatory Visit: Payer: Self-pay | Admitting: Family Medicine

## 2011-02-17 DIAGNOSIS — Z1211 Encounter for screening for malignant neoplasm of colon: Secondary | ICD-10-CM

## 2011-02-22 ENCOUNTER — Encounter (HOSPITAL_COMMUNITY): Payer: Self-pay | Admitting: Family Medicine

## 2011-02-22 ENCOUNTER — Telehealth: Payer: Self-pay | Admitting: *Deleted

## 2011-02-22 ENCOUNTER — Ambulatory Visit
Admission: RE | Admit: 2011-02-22 | Discharge: 2011-02-22 | Disposition: A | Discharge status: Home / Self Care | Payer: BC Managed Care – PPO | Source: Ambulatory Visit | Attending: Family Medicine | Admitting: Family Medicine

## 2011-02-22 ENCOUNTER — Ambulatory Visit (INDEPENDENT_AMBULATORY_CARE_PROVIDER_SITE_OTHER): Payer: BC Managed Care – PPO | Admitting: Sports Medicine

## 2011-02-22 VITALS — BP 130/80

## 2011-02-22 DIAGNOSIS — M25561 Pain in right knee: Secondary | ICD-10-CM

## 2011-02-22 DIAGNOSIS — M25569 Pain in unspecified knee: Secondary | ICD-10-CM

## 2011-02-22 MED ORDER — TRAMADOL HCL 50 MG PO TABS: 50.0000 mg | ORAL_TABLET | Freq: Four times a day (QID) | ORAL | Status: DC | PRN

## 2011-02-22 NOTE — Progress Notes (Signed)
  Subjective:    Patient ID: Kristina Rivera, female    DOB: Sep 24, 1960, 50 y.o.   MRN: [id number]  HPI New patient for right knee pain. Had steroid and lidocaine injection into the knee on Dec. 4th. Patient is now able to walk with little pain and has no pain at rest. She is obese. Her mechanism of injury may be related to a twisting injury at work, she works in hospice. She has no other history of knee pain. She has a history of bone spurs in her shoulders. She has no falls, no weakness, no gait abnormality, no limp, no radicular type pain.  Review of Systems No fevers, chills, no weight loss    Objective:   Physical Exam Filed Vitals:   02/22/11 0841  BP: 130/80  Right Knee: Minimal tenderness at upper medial joint line. Full ROM. No swelling or asymmetry compared to left. Negative apleys Negative apprehension Negative drawers Negative lachmans Positive mcmurrays without click.  Left knee: NT, FROM  Reflexes normal bilaterally. Strength equal and 5/5 bilaterally.   MS[facility] ultrasound The suprapatellar pouch and quadriceps tendon are normal The patellar tendon is normal The lateral meniscus may be narrowed but appears intact On the medial jointline there is an area of spurring and one area that looks like it may be a fractured spur or possibly a calcified part of the medial meniscus The medial meniscus is more difficult to visualize and the joint space seems more narrow    Assessment & Plan:

## 2011-02-22 NOTE — Patient Instructions (Signed)
It was great to see you today!  Schedule an appointment to see Dr. Darrick Penna in 1 month.  We ordered X-rays of your knees to look at your arthritis.  Keep working on weight loss, and knee strengthening exercises in your hand out.  We sent in a prescription for tramadol to help with pain as needed, this can make you drowsy and constipated as well.  Wear your leg brace most of the time, especially at work.   Osteoarthritis Osteoarthritis is the most common form of arthritis. It is redness, soreness, and swelling (inflammation) affecting the cartilage. Cartilage acts as a cushion, covering the ends of bones where they meet to form a joint. CAUSES   Over time, the cartilage begins to wear away. This causes bone to rub on bone. This produces pain and stiffness in the affected joints. Factors that contribute to this problem are:  Excessive body weight.     Age.    Overuse of joints.  SYMPTOMS    People with osteoarthritis usually experience joint pain, swelling, or stiffness.     Over time, the joint may lose its normal shape.     Small deposits of bone (osteophytes) may grow on the edges of the joint.     Bits of bone or cartilage can break off and float inside the joint space. This may cause more pain and damage.     Osteoarthritis can lead to depression, anxiety, feelings of helplessness, and limitations on daily activities.  The most commonly affected joints are in the:  Ends of the fingers.     Thumbs.    Neck.    Lower back.     Knees.    Hips.  DIAGNOSIS  Diagnosis is mostly based on your symptoms and exam. Tests may be helpful, including:  X-rays of the affected joint.     A computerized magnetic scan (MRI).     Blood tests to rule out other types of arthritis.     Joint fluid tests. This involves using a needle to draw fluid from the joint and examining the fluid under a microscope.  TREATMENT   Goals of treatment are to control pain, improve joint function,  maintain a normal body weight, and maintain a healthy lifestyle. Treatment approaches may include:  A prescribed exercise program with rest and joint relief.     Weight control with nutritional education.     Pain relief techniques such as:     Properly applied heat and cold.     Electric pulses delivered to nerve endings under the skin (transcutaneous electrical nerve stimulation, TENS).     Massage.    Certain supplements. Ask your caregiver before using any supplements, especially in combination with prescribed drugs.     Medicines to control pain, such as:     Acetaminophen.    Nonsteroidal anti-inflammatory drugs (NSAIDs), such as naproxen.     Narcotic or central-acting agents, such as tramadol. This drug carries a risk of addiction and is generally prescribed for short-term use.     Corticosteroids. These can be given orally or as injection. This is a short-term treatment, not recommended for routine use.     Surgery to reposition the bones and relieve pain (osteotomy) or to remove loose pieces of bone and cartilage. Joint replacement may be needed in advanced states of osteoarthritis.  HOME CARE INSTRUCTIONS   Your caregiver can recommend specific types of exercise. These may include:  Strengthening exercises. These are done to strengthen the muscles  that support joints affected by arthritis. They can be performed with weights or with exercise bands to add resistance.     Aerobic activities. These are exercises, such as brisk walking or low-impact aerobics, that get your heart pumping. They can help keep your lungs and circulatory system in shape.     Range-of-motion activities. These keep your joints limber.     Balance and agility exercises. These help you maintain daily living skills.  Learning about your condition and being actively involved in your care will help improve the course of your osteoarthritis. SEEK MEDICAL CARE IF:    You feel hot or your skin turns  red.     You develop a rash in addition to your joint pain.     You have an oral temperature above 102 F (38.9 C).  FOR MORE INFORMATION   National Institute of Arthritis and Musculoskeletal and Skin Diseases: www.niams.http://www.myers.net/ [employer] on Aging: https://walker.com/ American College of Rheumatology: www.rheumatology.org Document Released: 02/28/2005 Document Revised: 11/10/2010 Document Reviewed: 06/11/2009 [facility] Patient Information 2012 [place], [place].

## 2011-02-22 NOTE — Assessment & Plan Note (Signed)
Most likely DJD. Bone spurs seen on ultrasound. Weight loss recommended. Knee brace. xray's of the knee. Tramadol. Knee exercises. F/u in one month

## 2011-02-22 NOTE — Telephone Encounter (Signed)
Pt called and informed of appt with [facility] [address] ph: [phone] on 12.31.2013 @ 1100 am. Pt asked that if she cannot keep this appt to call their office to cancel/reschedule pt understood and agreed.Loralee Pacas [place]

## 2011-02-24 ENCOUNTER — Encounter: Payer: Self-pay | Admitting: Family Medicine

## 2011-08-03 ENCOUNTER — Other Ambulatory Visit: Payer: Self-pay | Admitting: Family Medicine

## 2011-09-17 ENCOUNTER — Emergency Department (INDEPENDENT_AMBULATORY_CARE_PROVIDER_SITE_OTHER)
Admission: EM | Admit: 2011-09-17 | Discharge: 2011-09-17 | Disposition: A | Discharge status: Hospice | Payer: Worker's Compensation | Source: Home / Self Care | Attending: Emergency Medicine | Admitting: Emergency Medicine

## 2011-09-17 ENCOUNTER — Encounter (HOSPITAL_COMMUNITY): Payer: Self-pay

## 2011-09-17 DIAGNOSIS — S39012A Strain of muscle, fascia and tendon of lower back, initial encounter: Secondary | ICD-10-CM

## 2011-09-17 DIAGNOSIS — S335XXA Sprain of ligaments of lumbar spine, initial encounter: Secondary | ICD-10-CM

## 2011-09-17 MED ORDER — TRAMADOL HCL 50 MG PO TABS: 50.0000 mg | ORAL_TABLET | Freq: Four times a day (QID) | ORAL | Status: AC | PRN

## 2011-09-17 MED ORDER — CYCLOBENZAPRINE HCL 10 MG PO TABS: 10.0000 mg | ORAL_TABLET | Freq: Three times a day (TID) | ORAL | Status: AC | PRN

## 2011-09-17 NOTE — ED Provider Notes (Signed)
History     CSN: [id number]  Arrival date & time 09/17/11  1515   First MD Initiated Contact with Patient 09/17/11 1600      Chief Complaint  Patient presents with  . Back Pain    (Consider location/radiation/quality/duration/timing/severity/associated sxs/prior treatment) Patient is a 51 y.o. female presenting with back pain. The history is provided by the patient.  Back Pain  This is a new problem. The current episode started yesterday. The problem occurs constantly. The problem has not changed since onset.The pain is present in the lumbar spine. The quality of the pain is described as stabbing. The pain is at a severity of 7/10. The pain is moderate. The symptoms are aggravated by twisting and bending. Associated symptoms include tingling. Pertinent negatives include no fever, no numbness, no weight loss, no headaches, no abdominal pain, no bowel incontinence, no perianal numbness, no dysuria, no pelvic pain, no paresthesias and no weakness. She has tried walking for the symptoms. The treatment provided no relief.    History reviewed. No pertinent past medical history.  History reviewed. No pertinent past surgical history.  History reviewed. No pertinent family history.  History  Substance Use Topics  . Smoking status: Never Smoker   . Smokeless tobacco: Not on file  . Alcohol Use: No    OB History    Grav Para Term Preterm Abortions TAB SAB Ect Mult Living                  Review of Systems  Constitutional: Negative for fever, chills, weight loss, activity change and appetite change.  Gastrointestinal: Negative for abdominal pain and bowel incontinence.  Genitourinary: Negative for dysuria and pelvic pain.  Musculoskeletal: Positive for back pain.  Skin: Positive for color change. Negative for pallor, rash and wound.  Neurological: Positive for tingling. Negative for weakness, numbness, headaches and paresthesias.    Allergies  Aspirin  Home Medications    Current Outpatient Rx  Name Route Sig Dispense Refill  . ACETAMINOPHEN 500 MG PO TABS Oral Take 1,000 mg by mouth every 8 (eight) hours.      . ALBUTEROL SULFATE HFA 108 (90 BASE) MCG/ACT IN AERS Inhalation Inhale 2 puffs into the lungs every 4 (four) hours as needed.      . CYCLOBENZAPRINE HCL 10 MG PO TABS Oral Take 1 tablet (10 mg total) by mouth 3 (three) times daily as needed for muscle spasms. 20 tablet 0  . FLUTICASONE PROPIONATE 50 MCG/ACT NA SUSP Nasal 1 spray by Nasal route daily.      . TRAMADOL HCL 50 MG PO TABS Oral Take 1 tablet (50 mg total) by mouth every 6 (six) hours as needed for pain. Maximum dose= 8 tablets per day 15 tablet 0    BP 158/99  Pulse 70  Temp 99.1 F (37.3 C) (Oral)  Resp 17  SpO2 97%  Physical Exam  Nursing note and vitals reviewed. Constitutional: She appears well-developed and well-nourished.  HENT:  Head: Normocephalic.  Eyes: Conjunctivae are normal.  Neck: Neck supple.  Abdominal: She exhibits no distension. There is no tenderness.  Musculoskeletal: She exhibits tenderness.       Lumbar back: She exhibits decreased range of motion, tenderness and pain. She exhibits no bony tenderness, no swelling, no edema, no deformity, no laceration, no spasm and normal pulse.       Back:  Neurological: She is alert.  Skin: No rash noted. No erythema.    ED Course  Procedures (including critical  care time)  Labs Reviewed - No data to display No results found.   1. Strain of lumbar paraspinal muscle       MDM  Left sided lumbar sprain. No neuromuscular deficits. No symptoms of a cauda equina. Patient was instructed to follow-up with occupational health or her primary care Dr. to see if unable to return to work. Discuss optimize sedation of tramadol she was taking 1 tablet every 12 hours (last dose last night), patient also described that she is allergic to all NSAID's.      Jimmie Molly, MD 09/17/11 [id number]

## 2011-09-17 NOTE — ED Notes (Signed)
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  Pt having low back pain that radiates to lt leg.

## 2011-09-29 ENCOUNTER — Ambulatory Visit: Payer: BC Managed Care – PPO | Admitting: Sports Medicine

## 2011-10-18 ENCOUNTER — Ambulatory Visit: Payer: Worker's Compensation

## 2011-10-18 ENCOUNTER — Other Ambulatory Visit: Payer: Self-pay | Admitting: Occupational Medicine

## 2011-10-18 DIAGNOSIS — M549 Dorsalgia, unspecified: Secondary | ICD-10-CM

## 2011-10-28 ENCOUNTER — Encounter: Payer: Self-pay | Admitting: Family Medicine

## 2011-10-28 ENCOUNTER — Ambulatory Visit (INDEPENDENT_AMBULATORY_CARE_PROVIDER_SITE_OTHER): Payer: BC Managed Care – PPO | Admitting: Family Medicine

## 2011-10-28 VITALS — BP 130/81 | HR 83 | Ht 62.0 in

## 2011-10-28 DIAGNOSIS — M25561 Pain in right knee: Secondary | ICD-10-CM

## 2011-10-28 DIAGNOSIS — M25569 Pain in unspecified knee: Secondary | ICD-10-CM

## 2011-10-28 NOTE — Patient Instructions (Signed)
Very nice to meet you I want you to try to wear the insoles I gave you and the body helix for your knee.  I want you to take tylenol 650mg  three times a day no matter what.  i want you to come back in 1 month if you are still having pain.

## 2011-10-28 NOTE — Progress Notes (Signed)
Kristina Rivera is a very pleasant 51 year old female coming in with bilateral knee pain. Patient has a past medical history significant for mild DJD of both knees left greater than right. Patient recently has had a back injury and she has been going to physical therapy for her. Patient states that the exercises has seemed to exacerbate her knee pain. Patient states the knee pain is bilaterally, medial more than lateral, with no radiation. Patient denies any swelling any numbness of the extremities and still able to ambulate but has pain. Patient also states: From a sitting to standing position causes pain. Patient denies any fever locked on her or giving out on her. Patient denies any fevers or chills. Patient has tramadol but states that that is not helping with the pain. Patient has not had an injection before in either knee.  Review of systems 14 system review was done and negative as related to the chief complaint.  Physical exam Filed Vitals:   10/28/11 0839  BP: 130/81  Pulse: 83   General: Very pleasant alert and oriented obese female Respiratory: Patient speaks in complete sentences and does not appear short of breath. Knee exam: Patient's on inspection does not have any noticeable effusion. On palpation patient does have pain on the medial aspect of the left knee otherwise unremarkable. Patient has near full range of motion only lacking the last 5 of flexion on the left knee. Patient is neurovascularly intact distally. All ligamentous appear to be intact bilaterally. Patient has a negative McMurray sign bilaterally. Patella grind test is also negative.  Procedure note After verbal and written consent given pt was prepped with betadine.  1:3 kenalog 40 to lidocaine used in knees bilaterally.  Pt minimal bleeding dressed with band aid, Pt given red flags to look for pt had better pain control immediatly.     Right knee pain Patient has mild tricompartmental DJD of the knees bilaterally on  x-ray. Patient has pain with the left knee greater than right. Today we gave her sports insoles, body helix knee brace, as well as injections in both knees today. Patient given exercises to do at home and she will ask her physical therapist to give her more exercises for her knee. Patient is seen a physical therapist for her back pain. Patient will return in one month to make sure she is doing very well. Anticipate patient should make improvement. Encourage weight loss as well.

## 2011-10-28 NOTE — Assessment & Plan Note (Signed)
Patient has mild tricompartmental DJD of the knees bilaterally on x-ray. Patient has pain with the left knee greater than right. Today we gave her sports insoles, body helix knee brace, as well as injections in both knees today. Patient given exercises to do at home and she will ask her physical therapist to give her more exercises for her knee. Patient is seen a physical therapist for her back pain. Patient will return in one month to make sure she is doing very well. Anticipate patient should make improvement. Encourage weight loss as well.

## 2011-10-31 ENCOUNTER — Ambulatory Visit: Payer: Self-pay | Admitting: Sports Medicine

## 2011-11-01 ENCOUNTER — Other Ambulatory Visit: Payer: Self-pay | Admitting: Orthopaedic Surgery

## 2011-11-01 DIAGNOSIS — M545 Low back pain: Secondary | ICD-10-CM

## 2011-11-05 ENCOUNTER — Other Ambulatory Visit: Payer: Self-pay

## 2011-11-06 ENCOUNTER — Other Ambulatory Visit: Payer: Self-pay

## 2011-11-07 ENCOUNTER — Other Ambulatory Visit: Payer: Self-pay

## 2012-02-16 ENCOUNTER — Ambulatory Visit: Payer: Self-pay | Admitting: Family Medicine

## 2012-03-02 ENCOUNTER — Ambulatory Visit (INDEPENDENT_AMBULATORY_CARE_PROVIDER_SITE_OTHER): Payer: Self-pay | Admitting: Family Medicine

## 2012-03-02 ENCOUNTER — Encounter: Payer: Self-pay | Admitting: Family Medicine

## 2012-03-02 VITALS — BP 138/88 | HR 78 | Temp 98.4°F | Ht 62.0 in | Wt 292.6 lb

## 2012-03-02 DIAGNOSIS — F329 Major depressive disorder, single episode, unspecified: Secondary | ICD-10-CM

## 2012-03-02 MED ORDER — DULOXETINE HCL 30 MG PO CPEP: 30.0000 mg | ORAL_CAPSULE | Freq: Every day | ORAL | Status: DC

## 2012-03-02 NOTE — Patient Instructions (Signed)
Start taking Cymbalta every day.  You will most likely want to take it in the morning.  Come back to see me in 2 weeks so we can see how the medication is doing for you.

## 2012-03-09 DIAGNOSIS — F32A Depression, unspecified: Secondary | ICD-10-CM | POA: Insufficient documentation

## 2012-03-09 DIAGNOSIS — F329 Major depressive disorder, single episode, unspecified: Secondary | ICD-10-CM | POA: Insufficient documentation

## 2012-03-09 NOTE — Progress Notes (Signed)
Patient ID: Kristina Rivera, female   DOB: 01-08-1961, 51 y.o.   MRN: [id number] Subjective: The patient is a 51 y.o. year old female who presents today for concern for depression.  The patient recently had a psychological assessment done by a psychologist. I am not entirely clear and the reason this assessment was done, however he does have something to do with her chronic pain complaints. One of the results of these analysis, which I reviewed at the patient's office visit, was that she had certain features that were compatible with depression. Specifically she had poor sleep, depressed mood, anhedonia, and decreased energy. She denied any suicidal or homicidal thoughts. The patient is not previously been diagnosed with any psychological disorder and does not have any family history. There is no family history of bipolar or psychotic disorders.  Patient's past medical, social, and family history were reviewed and updated as appropriate. History  Substance Use Topics  . Smoking status: Never Smoker   . Smokeless tobacco: Never Used  . Alcohol Use: No   Objective:  Filed Vitals:   03/02/12 0951  BP: 138/88  Pulse: 78  Temp: 98.4 F (36.9 C)   Gen: No acute distress, obese CV: Regular rate and rhythm, no murmurs appreciated Resp: Clear to auscultation bilaterally  Assessment/Plan:  Please also see individual problems in problem list for problem-specific plans.

## 2012-03-09 NOTE — Assessment & Plan Note (Signed)
After reviewing the patient's psychological assessment, I agree that a trial on an antidepressant would be worthwhile. After long discussion with the patient we have decided to give a trial of Cymbalta. I chose this medication as the patient does have multiple chronic pain complaints and this medication has more evidence with chronic pain in many of the straight SSRIs. Patient will follow up with me in 2 weeks to assess her tolerance.

## 2012-03-16 ENCOUNTER — Encounter: Payer: Self-pay | Admitting: Family Medicine

## 2012-03-16 ENCOUNTER — Ambulatory Visit (INDEPENDENT_AMBULATORY_CARE_PROVIDER_SITE_OTHER): Payer: No Typology Code available for payment source | Admitting: Family Medicine

## 2012-03-16 VITALS — BP 146/85 | HR 74 | Temp 98.7°F | Ht 62.0 in | Wt 293.4 lb

## 2012-03-16 DIAGNOSIS — F329 Major depressive disorder, single episode, unspecified: Secondary | ICD-10-CM

## 2012-03-16 DIAGNOSIS — M549 Dorsalgia, unspecified: Secondary | ICD-10-CM

## 2012-03-16 NOTE — Patient Instructions (Signed)
It was good to see you today! I would recommend that you try the Cymbalta for longer.  Take it daily for at least 2 weeks and then come back to see me.

## 2012-03-26 DIAGNOSIS — M549 Dorsalgia, unspecified: Secondary | ICD-10-CM | POA: Insufficient documentation

## 2012-03-26 NOTE — Progress Notes (Signed)
Patient ID: Kristina Rivera, female   DOB: 02/02/1961, 52 y.o.   MRN: [id number] Subjective: The patient is a 52 y.o. year old female who presents today for f/u.  Patient reports that the psychologist she is seeing for workman's comp from back injury at work has changed his mind about her diagnosis.  He no longer thinks she has depression.  Patient found out about this several weeks ago and so decided to stop her Cymbalta.  She continues to have back and leg pain that is burning in quality.  The several days she took the Cymbalta she thinks it may have been better.  She does report some problems with feeling a little funny the 2 days she took the medication.  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  She denies any bowel or bladder incontinence.  Patient's past medical, social, and family history were reviewed and updated as appropriate. History  Substance Use Topics  . Smoking status: Never Smoker   . Smokeless tobacco: Never Used  . Alcohol Use: No   Objective:  Filed Vitals:   03/16/12 0959  BP: 146/85  Pulse: 74  Temp: 98.7 F (37.1 C)   Gen: NAD, obese Back: No significant discomfort with palpation Ext: 5/5 strength lower extremities.  Assessment/Plan:  Please also see individual problems in problem list for problem-specific plans.

## 2012-03-26 NOTE — Assessment & Plan Note (Signed)
Not sure what to make of the sudden reversal in diagnosis.  Regardless, given likely neurogenic component of pain, will still use Cymbalta which will likely help with mood if there is a problem.

## 2012-03-26 NOTE — Assessment & Plan Note (Signed)
Back pain radiating to legs without any weakness.  Likely neurogenic in eitology.  Encouraged continued use of cymbalta per pt ins.  Will see back in 2 weeks.

## 2012-04-06 ENCOUNTER — Other Ambulatory Visit: Payer: Self-pay | Admitting: *Deleted

## 2012-04-06 ENCOUNTER — Ambulatory Visit: Payer: Self-pay | Admitting: Family Medicine

## 2012-04-06 MED ORDER — DULOXETINE HCL 30 MG PO CPEP: 30.0000 mg | ORAL_CAPSULE | Freq: Every day | ORAL | Status: DC

## 2012-04-19 ENCOUNTER — Ambulatory Visit (INDEPENDENT_AMBULATORY_CARE_PROVIDER_SITE_OTHER): Payer: Worker's Compensation | Admitting: Family Medicine

## 2012-04-19 VITALS — BP 136/87 | HR 95 | Temp 99.4°F | Ht 62.0 in | Wt 293.0 lb

## 2012-04-19 DIAGNOSIS — M549 Dorsalgia, unspecified: Secondary | ICD-10-CM

## 2012-04-19 MED ORDER — DULOXETINE HCL 30 MG PO CPEP: 30.0000 mg | ORAL_CAPSULE | Freq: Every day | ORAL | Status: DC

## 2012-04-20 ENCOUNTER — Encounter: Payer: Self-pay | Admitting: Family Medicine

## 2012-04-20 NOTE — Progress Notes (Signed)
Patient ID: Kristina Rivera, female   DOB: June 10, 1960, 52 y.o.   MRN: [id number] Subjective: The patient is a 52 y.o. year old female who presents today for followup of back pain.  1. Back pain: Patient reports that since starting the Cymbalta she has had a 10-15% decrease in her back pain is able to be more active. She still has some days where she is in significant amounts of pain. She is not taking any additional pain medicine be she was not certain if it was safe. She does not really want to be on this medication long-term, but is happy with the results. She will be seeing a chiropractor sometime in the next month or so for the first time as other therapeutic modalities have not been terribly helpful.  Patient's past medical, social, and family history were reviewed and updated as appropriate. History  Substance Use Topics  . Smoking status: Never Smoker   . Smokeless tobacco: Never Used  . Alcohol Use: No   Objective:  Filed Vitals:   04/19/12 1630  BP: 136/87  Pulse: 95  Temp: 99.4 F (37.4 C)   Gen: No acute distress, morbidly obese  Assessment/Plan:  Please also see individual problems in problem list for problem-specific plans.

## 2012-04-20 NOTE — Assessment & Plan Note (Signed)
With the patient reporting improvement in her symptoms on Cymbalta we will continue this medication. She hasn't been on a couple of weeks. We will give another month to a month and a half consider increasing the dose.

## 2012-04-30 ENCOUNTER — Ambulatory Visit (INDEPENDENT_AMBULATORY_CARE_PROVIDER_SITE_OTHER): Payer: Self-pay | Admitting: Family Medicine

## 2012-04-30 ENCOUNTER — Encounter: Payer: Self-pay | Admitting: Family Medicine

## 2012-04-30 VITALS — BP 164/92 | HR 111 | Temp 99.3°F | Wt 290.3 lb

## 2012-04-30 DIAGNOSIS — N950 Postmenopausal bleeding: Secondary | ICD-10-CM

## 2012-04-30 NOTE — Assessment & Plan Note (Signed)
I will schedule patient for an endometrial biopsy in 4 days. I would, ideally, like to obtain a transvaginal ultrasound prior to that time as physical exam to diagnose any uterine abnormalities will be extremely limited secondary to body habitus.

## 2012-04-30 NOTE — Patient Instructions (Signed)
It was good to see you today. We will contact you about the ultrasound.  We will be doing the scheduling for it tomorrow. Come back for your endometrial biopsy.

## 2012-04-30 NOTE — Progress Notes (Signed)
Patient ID: Kristina Rivera, female   DOB: [birth year], 52 y.o.   MRN: [id number] Subjective: The patient is a 52 y.o. year old female who presents today for postmenopausal bleeding.  The patient reports that she went through menopause approximately 2 years ago. She has not had any bleeding since then. Beginning 2 days ago she had new onset of relatively heavy vaginal bleeding. She denies any history of trauma. She is not having intercourse. She has been having some cramping but otherwise is not having any problems with abdominal pain.  Patient's past medical, social, and family history were reviewed and updated as appropriate. History  Substance Use Topics  . Smoking status: Never Smoker   . Smokeless tobacco: Never Used  . Alcohol Use: No   Objective:  Filed Vitals:   04/30/12 1625  BP: 164/92  Pulse: 111  Temp: 99.3 F (37.4 C)   Gen: No acute distress, morbidly obese Abdomen: Soft, nontender, nondistended. No organomegaly.  Assessment/Plan:  Please also see individual problems in problem list for problem-specific plans.

## 2012-05-02 ENCOUNTER — Ambulatory Visit (HOSPITAL_COMMUNITY)
Admission: RE | Admit: 2012-05-02 | Discharge: 2012-05-02 | Disposition: A | Discharge status: Home / Self Care | Payer: Self-pay | Source: Ambulatory Visit | Attending: Family Medicine | Admitting: Family Medicine

## 2012-05-02 DIAGNOSIS — D259 Leiomyoma of uterus, unspecified: Secondary | ICD-10-CM | POA: Insufficient documentation

## 2012-05-02 DIAGNOSIS — N950 Postmenopausal bleeding: Secondary | ICD-10-CM | POA: Insufficient documentation

## 2012-05-02 DIAGNOSIS — Z9851 Tubal ligation status: Secondary | ICD-10-CM | POA: Insufficient documentation

## 2012-05-02 DIAGNOSIS — R9389 Abnormal findings on diagnostic imaging of other specified body structures: Secondary | ICD-10-CM | POA: Insufficient documentation

## 2012-05-03 ENCOUNTER — Ambulatory Visit (INDEPENDENT_AMBULATORY_CARE_PROVIDER_SITE_OTHER): Payer: Self-pay | Admitting: Family Medicine

## 2012-05-03 VITALS — BP 170/84 | HR 79 | Temp 98.7°F | Wt 289.6 lb

## 2012-05-03 MED ORDER — DULOXETINE HCL 30 MG PO CPEP: 30.0000 mg | ORAL_CAPSULE | Freq: Two times a day (BID) | ORAL | Status: DC

## 2012-05-03 NOTE — Progress Notes (Signed)
Patient ID: Kristina Rivera, female   DOB: May 04, 1960, 52 y.o.   MRN: [id number] Subjective: The patient is a 52 y.o. year old female who presents today for endometrial biopsy.  Bleeding has lessened since Monday.  Had [place] which showed 6.57mm endometrial stripe.  Patient notes that Cymbalta helps with back pain but seems to wear off in evening.  Patient's past medical, social, and family history were reviewed and updated as appropriate. History  Substance Use Topics  . Smoking status: Never Smoker   . Smokeless tobacco: Never Used  . Alcohol Use: No   Objective:  Filed Vitals:   05/03/12 1120  BP: 170/84  Pulse: 79  Temp: 98.7 F (37.1 C)   Endometrial Biopsy Procedure Note  Pre-operative Diagnosis: postmenopausal bleeding  Post-operative Diagnosis: same  Indications: postmenopausal bleeding  Procedure Details   Urine pregnancy test was not done.  The risks (including infection, bleeding, pain, and uterine perforation) and benefits of the procedure were explained to the patient and Written informed consent was obtained.  Antibiotic prophylaxis against endocarditis was not indicated.  A Graves' speculum inserted in the vagina, and the cervix prepped with povidone iodine.  Endocervical curettage with a Kevorkian curette was not performed.   A sharp tenaculum was applied to the anterior lip of the cervix for stabilization.  A sterile uterine sound was used to sound the uterus to a depth of 5.5cm.  A Pipelle endometrial aspirator was used to sample the endometrium.  Sample was sent for pathologic examination.  Condition: Stable  Complications: None  Plan:  The patient was advised to call for any fever or for prolonged or severe pain or bleeding. She was advised to use OTC acetaminophen as needed for mild to moderate pain. She was advised to avoid vaginal intercourse for 48 hours or until the bleeding has completely stopped.

## 2012-05-03 NOTE — Assessment & Plan Note (Signed)
Increase Cymbalta to BID.

## 2012-05-03 NOTE — Progress Notes (Signed)
Attending Note: I have seen and examined this patient. I have discussed this patient with the resident and reviewed the assessment and plan as documented above. I agree with the resident's findings and plan. I was present for the entire procedure and supervised the resident. Denny Levy MD, MA [phone] pager

## 2012-05-03 NOTE — Patient Instructions (Signed)
It was great to see you today! I should know by Monday about the pathology and we will be in touch about it. I am giving you the prescription for Cymbalta, 30mg  to take two times per day.

## 2012-05-03 NOTE — Assessment & Plan Note (Signed)
Will contact patient once path is back.

## 2012-05-07 ENCOUNTER — Telehealth: Payer: Self-pay | Admitting: Family Medicine

## 2012-05-07 NOTE — Telephone Encounter (Signed)
Spoke with patient and information was given. She also has a question about what the bleeding can be coming from. She stated that this bleeding did not start until she started taking Cymbalta and would like to know if that could be connected to this

## 2012-05-07 NOTE — Telephone Encounter (Signed)
Please call patient asap regarding results from biopsy.

## 2012-05-07 NOTE — Telephone Encounter (Signed)
If you get a second, let her know that she does NOT have cancer on her biopsy.  We will just wait and see if her bleeding gets better.  If it does get better, then great.  If she has another episode of bleeding, or is still bleeding in 2 weeks, we will get her in to see GYN.  I will call her after I finish clinic this evening if no one else gets a chance.

## 2012-05-08 NOTE — Telephone Encounter (Signed)
There is no connection between the two that I am aware of.  I also spoke with our pharmacist who agrees.

## 2012-05-10 ENCOUNTER — Ambulatory Visit: Payer: Self-pay

## 2012-05-10 ENCOUNTER — Ambulatory Visit: Payer: Self-pay | Admitting: Family Medicine

## 2012-05-17 ENCOUNTER — Telehealth: Payer: Self-pay | Admitting: Family Medicine

## 2012-05-17 NOTE — Telephone Encounter (Signed)
Wanted to let Dr Kristina Rivera know that she stopped taking Cymbalta on 2/27

## 2012-05-23 ENCOUNTER — Ambulatory Visit (INDEPENDENT_AMBULATORY_CARE_PROVIDER_SITE_OTHER): Payer: Self-pay | Admitting: *Deleted

## 2012-05-23 DIAGNOSIS — Z111 Encounter for screening for respiratory tuberculosis: Secondary | ICD-10-CM

## 2012-05-25 ENCOUNTER — Telehealth: Payer: Self-pay | Admitting: *Deleted

## 2012-05-25 ENCOUNTER — Ambulatory Visit (INDEPENDENT_AMBULATORY_CARE_PROVIDER_SITE_OTHER): Payer: Self-pay | Admitting: *Deleted

## 2012-05-25 DIAGNOSIS — Z111 Encounter for screening for respiratory tuberculosis: Secondary | ICD-10-CM

## 2012-05-25 LAB — TB SKIN TEST: Induration: 0 mm

## 2012-05-25 NOTE — Telephone Encounter (Signed)
Patient in today to read PPD and reports she stopped Cymbalta on 02/27. She continues having some dizziness but it is gradually improving. She decided to stop Cymbalta because she is now seeing a [facility]. Continues using TENS unit.. If this does not work she will come back to see Dr. Louanne Belton. She just wants him to be aware.

## 2012-08-21 ENCOUNTER — Telehealth: Payer: Self-pay | Admitting: Nurse Practitioner

## 2012-08-21 NOTE — Telephone Encounter (Signed)
Error in sending to triage poole.

## 2012-08-21 NOTE — Telephone Encounter (Signed)
Patient is out of [facility] . 2 weeks before appointment . Change of BC is not helping.

## 2012-11-23 ENCOUNTER — Encounter: Payer: Self-pay | Admitting: Family Medicine

## 2012-11-23 ENCOUNTER — Ambulatory Visit (INDEPENDENT_AMBULATORY_CARE_PROVIDER_SITE_OTHER): Payer: Worker's Compensation | Admitting: Family Medicine

## 2012-11-23 VITALS — BP 131/89 | HR 79 | Temp 98.8°F | Ht 62.0 in | Wt 294.8 lb

## 2012-11-23 DIAGNOSIS — J45909 Unspecified asthma, uncomplicated: Secondary | ICD-10-CM

## 2012-11-23 DIAGNOSIS — M549 Dorsalgia, unspecified: Secondary | ICD-10-CM

## 2012-11-23 DIAGNOSIS — E669 Obesity, unspecified: Secondary | ICD-10-CM

## 2012-11-23 LAB — CBC
HCT: 36.7 % (ref 36.0–46.0)
Hemoglobin: 12 g/dL (ref 12.0–15.0)
MCHC: 32.7 g/dL (ref 30.0–36.0)
MCV: 67.6 fL — ABNORMAL LOW (ref 78.0–100.0)

## 2012-11-23 LAB — LIPID PANEL
Cholesterol: 161 mg/dL (ref 0–200)
HDL: 47 mg/dL (ref >39)
Total CHOL/HDL Ratio: 3.4 Ratio

## 2012-11-23 LAB — BASIC METABOLIC PANEL
Calcium: 9.1 mg/dL (ref 8.4–10.5)
Glucose, Bld: 75 mg/dL (ref 70–99)
Potassium: 4.3 mEq/L (ref 3.5–5.3)
Sodium: 141 mEq/L (ref 135–145)

## 2012-11-23 LAB — TSH: TSH: 1.277 u[IU]/mL (ref 0.350–4.500)

## 2012-11-23 MED ORDER — ALBUTEROL SULFATE HFA 108 (90 BASE) MCG/ACT IN AERS: 2.0000 | INHALATION_SPRAY | RESPIRATORY_TRACT | Status: AC | PRN

## 2012-11-23 NOTE — Assessment & Plan Note (Addendum)
Recommended weight loss as part of her therapy for DJD and back pain. Labs Dietitian consult offered.

## 2012-11-23 NOTE — Progress Notes (Signed)
Family Medicine Office Visit Note   Subjective:   Patient ID: Kristina Rivera, female  DOB: 05/17/1960, 52 y.o.. MRN: [id number]   This is my first time meeting Kristina Rivera. She comes today requesting Tramadol prescription for her Chronic Back Pain. 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 She explains further that since then she has been f/u by Dr. Lessie Dings at [facility]. She states was diagnosed with disk herniation that is not that bad that requires surgery but bad enough to produce her back pain. She also has been diagnosed and treated with DJD of her back and knees bilaterally. Pt reports has gone to [facility], has had facet joint injections and has received Orthopedic tratment with PTwith no resolution of her symptoms. Pt reports was discharged after reaching maximum medical improvement but she states is still in pain. Pt reports taking Tylenol 650 mg BID and Tramadol was added 10 month ago. She stated has allergy to NSAIDs, she also has been on Cymbalta but nothing appears to help.  Pain is reported to be on left side of her back with radiation to lateral aspect of her thigh. This is reported to be intermittent. No saddle anesthesia, no incontinence. No weakness, numbness or tingling sensation.   Review of Systems:  Pt denies SOB, chest pain, palpitations, headaches, dizziness. No changes on urinary or BM habits. No unintentional weigh loss/gain.   Objective:   Physical Exam: Gen:  Morbid Obese. NAD HEENT: Moist mucous membranes  CV: Regular rate and rhythm, no murmurs rubs or gallops PULM: Clear to auscultation bilaterally. No wheezes/rales/rhonchi ABD: Soft, non tender, normal bowel sounds EXT: No edema MSK: Back - Normal skin, Spine with normal alignment and no deformity.  No tenderness to vertebral process palpation. Paraspinous muscles are not tender and without spasm. Range of motion is full at neck, thoracic and lumbar  regions SI joint tender to palpation on the left. Leg raise negative bilaterally.  Neuro: Alert and oriented x3. No focalization  Assessment & Plan:

## 2012-11-23 NOTE — Patient Instructions (Addendum)
It has been a pleasure to meet you today. I have placed referrals for: Sports Medicine, Pain Clinic and Dietitian Consult.  I will call you with the labs results if they come back abnormal otherwise you will receive a letter.

## 2012-11-23 NOTE — Assessment & Plan Note (Addendum)
Seems chronic in nature. Pt has gone through a variety of therapies with no improvement. P/ Pain clinic referral to evaluate other options for treatment. Chronic Opioid treatment not proven to be better than tylenol alone and side effects of tolerance and dependance outweigh the benefits. This has been discuss with pt in depth.  Also referral to SM to evaluate for possible SI dysfunction (? LE lengh discrepancy) Nutrition evaluation and importance to weight loss in order to slow process of wear and tear.

## 2012-11-27 ENCOUNTER — Encounter: Payer: Self-pay | Admitting: Family Medicine

## 2013-01-17 ENCOUNTER — Other Ambulatory Visit: Payer: Self-pay

## 2013-04-19 ENCOUNTER — Encounter (HOSPITAL_COMMUNITY): Payer: Self-pay | Admitting: Emergency Medicine

## 2013-04-19 ENCOUNTER — Ambulatory Visit (INDEPENDENT_AMBULATORY_CARE_PROVIDER_SITE_OTHER): Payer: No Typology Code available for payment source | Admitting: Family Medicine

## 2013-04-19 ENCOUNTER — Observation Stay (HOSPITAL_COMMUNITY)
Admission: EM | Admit: 2013-04-19 | Discharge: 2013-04-20 | Disposition: A | Discharge status: Home / Self Care | Payer: No Typology Code available for payment source | Attending: Family Medicine | Admitting: Family Medicine

## 2013-04-19 ENCOUNTER — Ambulatory Visit (HOSPITAL_COMMUNITY)
Admit: 2013-04-19 | Discharge: 2013-04-19 | Disposition: A | Discharge status: Home / Self Care | Payer: No Typology Code available for payment source

## 2013-04-19 ENCOUNTER — Emergency Department (HOSPITAL_COMMUNITY): Payer: No Typology Code available for payment source

## 2013-04-19 VITALS — BP 123/76 | HR 75 | Temp 99.5°F

## 2013-04-19 DIAGNOSIS — R7309 Other abnormal glucose: Secondary | ICD-10-CM

## 2013-04-19 DIAGNOSIS — R55 Syncope and collapse: Secondary | ICD-10-CM | POA: Insufficient documentation

## 2013-04-19 DIAGNOSIS — G8929 Other chronic pain: Secondary | ICD-10-CM | POA: Insufficient documentation

## 2013-04-19 DIAGNOSIS — F410 Panic disorder [episodic paroxysmal anxiety] without agoraphobia: Secondary | ICD-10-CM | POA: Insufficient documentation

## 2013-04-19 DIAGNOSIS — Z888 Allergy status to other drugs, medicaments and biological substances status: Secondary | ICD-10-CM | POA: Insufficient documentation

## 2013-04-19 DIAGNOSIS — R799 Abnormal finding of blood chemistry, unspecified: Secondary | ICD-10-CM | POA: Insufficient documentation

## 2013-04-19 DIAGNOSIS — E669 Obesity, unspecified: Secondary | ICD-10-CM | POA: Insufficient documentation

## 2013-04-19 DIAGNOSIS — J45909 Unspecified asthma, uncomplicated: Secondary | ICD-10-CM | POA: Insufficient documentation

## 2013-04-19 DIAGNOSIS — R079 Chest pain, unspecified: Secondary | ICD-10-CM | POA: Insufficient documentation

## 2013-04-19 DIAGNOSIS — M549 Dorsalgia, unspecified: Secondary | ICD-10-CM | POA: Insufficient documentation

## 2013-04-19 DIAGNOSIS — Z886 Allergy status to analgesic agent status: Secondary | ICD-10-CM | POA: Insufficient documentation

## 2013-04-19 DIAGNOSIS — R51 Headache: Secondary | ICD-10-CM | POA: Insufficient documentation

## 2013-04-19 DIAGNOSIS — M1990 Unspecified osteoarthritis, unspecified site: Secondary | ICD-10-CM | POA: Insufficient documentation

## 2013-04-19 DIAGNOSIS — F329 Major depressive disorder, single episode, unspecified: Secondary | ICD-10-CM

## 2013-04-19 DIAGNOSIS — F3289 Other specified depressive episodes: Secondary | ICD-10-CM | POA: Insufficient documentation

## 2013-04-19 DIAGNOSIS — R0789 Other chest pain: Principal | ICD-10-CM | POA: Insufficient documentation

## 2013-04-19 DIAGNOSIS — F32A Depression, unspecified: Secondary | ICD-10-CM

## 2013-04-19 HISTORY — DX: Unspecified asthma, uncomplicated: J45.909

## 2013-04-19 HISTORY — DX: Low back pain: M54.5

## 2013-04-19 HISTORY — DX: Other chronic pain: G89.29

## 2013-04-19 HISTORY — DX: Low back pain, unspecified: M54.50

## 2013-04-19 HISTORY — DX: Unspecified osteoarthritis, unspecified site: M19.90

## 2013-04-19 HISTORY — DX: Headache, unspecified: R51.9

## 2013-04-19 HISTORY — DX: Headache: R51

## 2013-04-19 LAB — LIPID PANEL
Cholesterol: 155 mg/dL (ref 0–200)
HDL: 52 mg/dL (ref >39)
LDL Cholesterol: 82 mg/dL (ref 0–99)
Total CHOL/HDL Ratio: 3 RATIO
Triglycerides: 103 mg/dL (ref <150)
VLDL: 21 mg/dL (ref 0–40)

## 2013-04-19 LAB — CBC WITH DIFFERENTIAL/PLATELET
BASOS ABS: 0 10*3/uL (ref 0.0–0.1)
Basophils Relative: 0 % (ref 0–1)
Eosinophils Absolute: 0.3 10*3/uL (ref 0.0–0.7)
Eosinophils Relative: 3 % (ref 0–5)
HCT: 39.3 % (ref 36.0–46.0)
Hemoglobin: 12.8 g/dL (ref 12.0–15.0)
Lymphocytes Relative: 43 % (ref 12–46)
Lymphs Abs: 4.1 10*3/uL — ABNORMAL HIGH (ref 0.7–4.0)
MCH: 23.2 pg — ABNORMAL LOW (ref 26.0–34.0)
MCHC: 32.6 g/dL (ref 30.0–36.0)
MCV: 71.3 fL — AB (ref 78.0–100.0)
MONOS PCT: 8 % (ref 3–12)
Monocytes Absolute: 0.8 10*3/uL (ref 0.1–1.0)
Neutro Abs: 4.3 10*3/uL (ref 1.7–7.7)
Neutrophils Relative %: 46 % (ref 43–77)
PLATELETS: 332 10*3/uL (ref 150–400)
RBC: 5.51 MIL/uL — ABNORMAL HIGH (ref 3.87–5.11)
RDW: 15 % (ref 11.5–15.5)
WBC: 9.5 10*3/uL (ref 4.0–10.5)

## 2013-04-19 LAB — BASIC METABOLIC PANEL
BUN: 11 mg/dL (ref 6–23)
CALCIUM: 9.6 mg/dL (ref 8.4–10.5)
CO2: 27 mEq/L (ref 19–32)
CREATININE: 0.9 mg/dL (ref 0.50–1.10)
Chloride: 100 mEq/L (ref 96–112)
GFR, EST AFRICAN AMERICAN: 83 mL/min — AB (ref >90)
GFR, EST NON AFRICAN AMERICAN: 72 mL/min — AB (ref >90)
GLUCOSE: 89 mg/dL (ref 70–99)
Potassium: 4.2 mEq/L (ref 3.7–5.3)
Sodium: 142 mEq/L (ref 137–147)

## 2013-04-19 LAB — TROPONIN I: Troponin I: <0.3 ng/mL (ref <0.30)

## 2013-04-19 MED ORDER — MORPHINE SULFATE 2 MG/ML IJ SOLN: 2.0000 mg | INTRAMUSCULAR | Status: DC | PRN

## 2013-04-19 MED ORDER — ACETAMINOPHEN 325 MG PO TABS: 650.0000 mg | ORAL_TABLET | ORAL | Status: DC | PRN

## 2013-04-19 MED ORDER — NITROGLYCERIN 0.4 MG SL SUBL: 0.4000 mg | SUBLINGUAL_TABLET | SUBLINGUAL | Status: DC | PRN

## 2013-04-19 MED ORDER — ONDANSETRON HCL 4 MG/2ML IJ SOLN: 4.0000 mg | Freq: Four times a day (QID) | INTRAMUSCULAR | Status: DC | PRN

## 2013-04-19 MED ORDER — METOCLOPRAMIDE HCL 5 MG/ML IJ SOLN
10.0000 mg | Freq: Once | INTRAMUSCULAR | Status: DC
  Filled 2013-04-19: qty 2

## 2013-04-19 MED ORDER — ENOXAPARIN SODIUM 40 MG/0.4ML ~~LOC~~ SOLN
40.0000 mg | SUBCUTANEOUS | Status: DC
  Administered 2013-04-19: 40 mg via SUBCUTANEOUS
  Filled 2013-04-19 (×2): qty 0.4

## 2013-04-19 MED ORDER — CLOPIDOGREL BISULFATE 75 MG PO TABS
75.0000 mg | ORAL_TABLET | Freq: Once | ORAL | Status: AC
  Administered 2013-04-19: 75 mg via ORAL
  Filled 2013-04-19: qty 1

## 2013-04-19 MED ORDER — ACETAMINOPHEN 500 MG PO TABS
1000.0000 mg | ORAL_TABLET | Freq: Once | ORAL | Status: AC
  Administered 2013-04-19: 1000 mg via ORAL
  Filled 2013-04-19: qty 2

## 2013-04-19 MED ORDER — DIPHENHYDRAMINE HCL 50 MG/ML IJ SOLN
25.0000 mg | Freq: Once | INTRAMUSCULAR | Status: DC
  Filled 2013-04-19: qty 1

## 2013-04-19 NOTE — H&P (Signed)
[facility] Admission History and Physical Service Pager: [phone]  Patient name: Kristina Rivera Medical record number: [id number] Date of birth: 1960-03-29 Age: 53 y.o. Gender: female  Primary Care Provider: Gwendolyn Fill, MD Consultants: None Code Status: Full code  Chief Complaint: Chest pain  Assessment and Plan: Kristina Rivera is a 53 y.o. female presenting with chest pain. PMH significant for panic attacks, depression, asthma, and morbid obesity (BMI 54.7).  Chest pain - Heart score 2.   Given history and physical exam, this appears unlikely to be cardiac in origin.  However, patient does have a significant family history of heart disease.  - Will admit to Telemetry - Will cycle Troponin's - Risk  stratification labs: Hemoglobin A1c, TSH, lipid panel. - EKG in the a.m. - PRN Morphine and SL Nitro for pain - Patient was not given aspirin in the ED and she has a significant allergy (anaphylaxis).  I spoke with pharmacy regarding this.  Will give patient one time dose of Plavix for antiplatelet therapy. - If workup is unremarkable, she will likely be discharged tomorrow.  Hx of Depression and Anxiety attacks - Patient not on any medication for this at this time - Will monitor closely during admission.  Patient will need close outpatient follow up with her PCP regarding this.  Elevated BP - Patient currently with elevated blood pressure.  She has no history of hypertension. - Will monitor closely during admission.  Patient may need antihypertensives during admission and will certainly need close outpatient followup.  Asthma - Stable. - No evidence of exacerbation at this time.  FEN/GI: SL IV; Heart Healthy Diet Prophylaxis: Lovenox  Disposition: Pending further work up.   History of Present Illness:  Kristina Rivera is a 53 y.o. female presenting from family medicine clinic with chest pain.  Patient reports that she has had intermittent  sternal chest pain since the end of December. She states that her chest pain seems to be triggered by proximity to electric devices (i.e. cell phones, wireless routers, laptops, etc.).  She states she can feel the "field" when she enters a room.  She also reports that after using her cell phone she has significant chest pain and headache.  She reports that her chest pain is intermittent but is often severe (10/10).  She describes chest pain as a sharp sensation.  No radiation.  No association with exertion.  Not relieved by rest.  Only relieved when she distances herself from the mentioned devices.  She does report some associated shortness of breath.  She reports that she has had a few episodes where she feels as if she is going to pass out.    When asked about her headaches, she reports that they're located temporally.  Described as sharp in character.  As for the chest pain, this is relieved by distancing herself from electric devices.  Patient is currently pain free.  Review Of Systems: Per HPI with the following additions:  she denies nausea, vomiting, diarrhea, fever, chills, heartburn, increasing LE edema.   Otherwise 12 point review of systems was performed and was unremarkable.  Patient Active Problem List   Diagnosis Date Noted  . Chest pain 04/19/2013  . Post-menopausal bleeding 04/30/2012  . Back pain 03/26/2012  . Depression 03/09/2012  . Right knee pain 02/10/2011  . PANIC ATTACK 06/22/2009  . OBESITY, NOS 05/11/2006  . RHINITIS, ALLERGIC 05/11/2006  . ASTHMA, INTERMITTENT 05/11/2006   Past Medical History: Past Medical History  Diagnosis Date  . Arthritis     back pain   Past Surgical History: History reviewed. No pertinent past surgical history.  Social History: History  Substance Use Topics  . Smoking status: Never Smoker   . Smokeless tobacco: Never Used  . Alcohol Use: No   Family History: Patient reports that Mother had an MI in her 5's. Father - CVA   Siblings - HTN, DM-2  Allergies and Medications: Allergies  Allergen Reactions  . Nsaids Anaphylaxis  . Aspirin     REACTION: Hives, Anaphylaxis   No current facility-administered medications on file prior to encounter.   Current Outpatient Prescriptions on File Prior to Encounter  Medication Sig Dispense Refill  . acetaminophen (TYLENOL) 500 MG tablet Take 1,000 mg by mouth every 8 (eight) hours.        Marland Kitchen albuterol (VENTOLIN HFA) 108 (90 BASE) MCG/ACT inhaler Inhale 2 puffs into the lungs as needed.  1 Inhaler  6   Objective: BP 177/111  Pulse 84  Temp(Src) 98.2 F (36.8 C) (Oral)  Resp 18  Ht 5\' 2"  (1.575 m)  Wt 299 lb (135.626 kg)  BMI 54.67 kg/m2  SpO2 98% Exam: General: Obese female sitting up in bed, no acute distress. HEENT: NCAT. Sclera anicteric. Cardiovascular: RRR. No murmur appreciated.  Respiratory: CTAB. No rales, rhonchi, or wheezing. Abdomen: Obese, soft, nontender, nondistended. Extremities: No LE edema. Skin: Warm, dry, intact. Neuro: No focal deficits on exam. Psych: Normal mood. Flat affect.  Patient perseverative regarding "electrical devices" and "electromagnetic field".   Labs and Imaging: CBC BMET   Recent Labs Lab 04/19/13 1705  WBC 9.5  HGB 12.8  HCT 39.3  PLT 332    Recent Labs Lab 04/19/13 1705  NA 142  K 4.2  CL 100  CO2 27  BUN 11  CREATININE 0.90  GLUCOSE 89  CALCIUM 9.6     Troponin negative x 1.   EKG - NSR. Nonspecific T wave changes noted.   Dg Chest 2 View 04/19/2013   CLINICAL DATA:  Left chest tightness for the past 2 months. Shortness of breath.  EXAM: CHEST  2 VIEW  COMPARISON:  04/27/2007.  FINDINGS: Poor inspiration. Grossly stable borderline enlargement of the cardiac silhouette. Clear lungs. Thoracic spine degenerative changes.  IMPRESSION: No acute abnormality.   Coral Spikes, DO 04/19/2013, 8:48 PM PGY-2, [facility] pager: [phone], text pages welcome

## 2013-04-19 NOTE — ED Notes (Signed)
Pt did not want reglan and benedryl for pain. She states her headache is going away.

## 2013-04-19 NOTE — Assessment & Plan Note (Addendum)
Chest pain associated to transitory elevated BP. Most likely panic attack but EKG withT wave inversion on V2, V3, V4 and V5. It seems unspecific but no prior EKG to compare.  Pain now is reported to be minimal. Case is discussed with attending Dr. Gwendlyn Deutscher and pt is instructed to go to ED for further evaluation with troponin. She refuses going by ambulance and states her husband will take her there.

## 2013-04-19 NOTE — Progress Notes (Signed)
Family Medicine Office Visit Note   Subjective:   Patient ID: Kristina Rivera, female  DOB: 1960/10/31, 53 y.o.. MRN: [id number]   Pt that comes today complaining of chest pain that appears when pt is "close to wireless devices" per her report. She states that she can feel the "field" when approaching electronic devices and this gives her headaches and chest pain that completely resolves when she removes herself from the electronic field.  Started since last December and has been worsening. Pain is in the middle of the chest without irradiation to neck or arm. Has not relation to exertion and she has never taken anything for it since it seems to resolve in minutes when she is not longer "under electronic field she can feel".  At this time she reports pain is minimal and no other associated symptoms are present.   Review of Systems:  Pt denies SOB, palpitations, dizziness, numbness or weakness.  Objective:   Physical Exam: Gen: initially distressed due to pain, but after sitting for 2 min she was in NAD and cooperating with examination.  HEENT: Moist mucous membranes. Neck supple no bruit. No JVD CV: Regular rate and rhythm, no murmurs rubs or gallops PULM: Clear to auscultation bilaterally. No wheezes/rales/rhonch EXT: No edema Neuro: Alert and oriented x3. No focalization  Assessment & Plan:

## 2013-04-19 NOTE — ED Notes (Signed)
Pt c/o intermittent chest pressure, headache and sob since Christmas.  She is intent on the fact that these s/s occur when she's around a lot of wireless.

## 2013-04-19 NOTE — Patient Instructions (Signed)
You need further evaluation of your chest pain. As we discussed a blood test is needed to rule out a cardiovascular process.

## 2013-04-20 LAB — TSH: TSH: 1.878 u[IU]/mL (ref 0.350–4.500)

## 2013-04-20 LAB — TROPONIN I
Troponin I: <0.3 ng/mL (ref <0.30)
Troponin I: <0.3 ng/mL (ref <0.30)

## 2013-04-20 LAB — HEMOGLOBIN A1C
Hgb A1c MFr Bld: 6.7 % — ABNORMAL HIGH (ref <5.7)
Mean Plasma Glucose: 146 mg/dL — ABNORMAL HIGH (ref <117)

## 2013-04-20 NOTE — Plan of Care (Signed)
Problem: Phase I Progression Outcomes Goal: Aspirin unless contraindicated Outcome: Not Applicable Date Met:  59/93/57 Contraindicated, pt is allergic

## 2013-04-20 NOTE — ED Provider Notes (Signed)
CSN: [id number]     Arrival date & time 04/19/13  1616 History   First MD Initiated Contact with Patient 04/19/13 1629     Chief Complaint  Patient presents with  . Headache  . Chest Pain    HPI:  Kristina Rivera is a 53 year old female with a history of asthma, osteoarthritis, chronic back pain and anxiety who presents with chest pain and headache. She has had intermittent headaches and chest pain for the last 2 months. The symptoms are typically associated with being around wireless devices. She describes her headaches as sharp, located bi-temporally, no radiation, relieved with distancing herself from these wireless devices. She typically has substernal chest pain associated with these headaches. Pain is described as pressure, non-radiating, not exertional or pleuritic in nature. Occasionally associated with shortness of breath but not diaphoresis or nausea. When the pain occurs it is severe, 10 out of 10. She had another episode of chest pain today and felt as if she was going to pass out due to the severity of her pain. She has not been coughing, no recent travel, no sick contacts. She was evaluated by her primary care physician who obtained in ECG which revealed  T wave inversions in anterolateral leads. Given these findings she was sent to the emergency department for further evaluation. She has no history of coronary artery disease. She has no history of blood clots. She does not take exogenous estrogen. On arrival she complains of headache but does not have chest pain. She was not given aspirin if she has an anaphylactic reaction.   Past Medical History  Diagnosis Date  . Asthma   . Daily headache     "last couple months" (04/19/2013)  . Arthritis     "back; knees; shoulders" (04/19/2013)  . Chronic lower back pain    Past Surgical History  Procedure Laterality Date  . Carpal tunnel release Left 1999?  Marland Kitchen Tubal ligation  1993   History reviewed. No pertinent family history. History   Substance Use Topics  . Smoking status: Never Smoker   . Smokeless tobacco: Never Used  . Alcohol Use: No   OB History   Grav Para Term Preterm Abortions TAB SAB Ect Mult Living                  Review of Systems  Constitutional: Negative for fever, chills, appetite change and fatigue.  HENT: Negative for congestion.   Eyes: Negative for photophobia and visual disturbance.  Respiratory: Negative for cough and shortness of breath.   Cardiovascular: Positive for chest pain. Negative for leg swelling.  Gastrointestinal: Negative for nausea, vomiting, abdominal pain, diarrhea and constipation.  Genitourinary: Negative for dysuria, frequency and decreased urine volume.  Musculoskeletal: Positive for back pain ( chronic). Negative for arthralgias and myalgias.  Skin: Negative for color change and wound.  Neurological: Positive for syncope ( Near syncope) and headaches. Negative for dizziness, facial asymmetry, speech difficulty, weakness, light-headedness and numbness.  Psychiatric/Behavioral: Negative for confusion and agitation.  All other systems reviewed and are negative.     Allergies  Nsaids and Aspirin  Home Medications  No current outpatient prescriptions on file. BP 177/111  Pulse 84  Temp(Src) 98.2 F (36.8 C) (Oral)  Resp 18  Ht 5\' 2"  (1.575 m)  Wt 299 lb (135.626 kg)  BMI 54.67 kg/m2  SpO2 98%   Physical Exam  Nursing note and vitals reviewed. Constitutional: She is oriented to person, place, and time. No distress.  Middle-age female,  sitting up in bed, appears comfortable.  HENT:  Head: Normocephalic and atraumatic.  Mouth/Throat: Oropharynx is clear and moist.  Eyes: Conjunctivae and EOM are normal. Pupils are equal, round, and reactive to light.  Neck: Normal range of motion. Neck supple.  Cardiovascular: Normal rate, regular rhythm, normal heart sounds and intact distal pulses.   Pulmonary/Chest: Effort normal and breath sounds normal. No respiratory  distress.  Abdominal: Soft. Bowel sounds are normal. There is no tenderness. There is no rebound and no guarding.  Musculoskeletal: Normal range of motion. She exhibits no edema and no tenderness.  Neurological: She is alert and oriented to person, place, and time. No cranial nerve deficit. Coordination normal.   CN 3-12 grossly intact. No pronator drift. Normal finger to nose and heel to shin.  Strength 5+ out of 5 in all extremities. DTRs 2+. Normal sensation.  Skin: Skin is warm and dry. No rash noted.  Psychiatric: She has a normal mood and affect. Her behavior is normal.     ED Course  Procedures (including critical care time) Labs Review Labs Reviewed  CBC WITH DIFFERENTIAL - Abnormal; Notable for the following:    RBC 5.51 (*)    MCV 71.3 (*)    MCH 23.2 (*)    Lymphs Abs 4.1 (*)    All other components within normal limits  BASIC METABOLIC PANEL - Abnormal; Notable for the following:    GFR calc non Af Amer 72 (*)    GFR calc Af Amer 83 (*)    All other components within normal limits  TROPONIN I  TROPONIN I  LIPID PANEL  TROPONIN I  TROPONIN I  TSH  HEMOGLOBIN A1C   The dealing Imaging Review Dg Chest 2 View  04/19/2013   CLINICAL DATA:  Left chest tightness for the past 2 months. Shortness of breath.  EXAM: CHEST  2 VIEW  COMPARISON:  04/27/2007.  FINDINGS: Poor inspiration. Grossly stable borderline enlargement of the cardiac silhouette. Clear lungs. Thoracic spine degenerative changes.  IMPRESSION: No acute abnormality   Electronically Signed   By: Enrique Sack M.D.   On: 04/19/2013 18:06      MDM    53 year old female with a history of hypertension, hyperlipidemia and prediabetes who presents with chest pain and headaches. Her headaches have been ongoing for several months, gradual onset, no neurologic deficits, no neck pain, low suspicion for intracranial hemorrhage or meningitis. Given chronicity of her headaches and normal neurologic exam, no emergent imaging is  indicated. Her chest pain is atypical for ACS, however she does have risk factors including HTN, HLD and obesity. Initial ECG does reveal T wave inversions in anterior lateral leads but no acute ischemic changes. She is currently pain-free in the emergency department. No aspirin given as she has an anaphylactic reaction. Her chest x-ray was negative for widened mediastinum, consolidation, effusion or pneumothorax.  Low suspicion for pulmonary embolism and she has no significant shortness of breath, not tachycardic or hypoxic. Basic laboratory studies including CBC and BMP were unremarkable. Her initial troponin was undetectable. Patient was given a migraine cocktail with improvement of her headache. I spoke to the family medicine resident and they will admit the patient for cardiac rule out. I spoke to the patient about this and she was in agreement with plan. She had no recurrent chest pain while in the emergency department and remained hemodynamically stable  Reviewed imaging, ECG,  Labs and previous medical records, utilized in medical decision-making  Discussed case with  Dr. Audie Pinto  Clinical impression 1. Chest pain     Louretta Shorten, MD 04/20/13 [phone]

## 2013-04-20 NOTE — Progress Notes (Signed)
Utilization Review completed.  

## 2013-04-20 NOTE — ED Provider Notes (Signed)
I saw and evaluated the patient, reviewed the resident's note and I agree with the findings and plan.   .Face to face Exam:  General:  Awake HEENT:  Atraumatic Resp:  Normal effort Abd:  Nondistended Neuro:No focal weakness   Reviewed the EKG with the resident and agree with the findings.    Dot Lanes, MD 04/20/13 [phone]

## 2013-04-20 NOTE — Discharge Summary (Signed)
Seen and examined.  Agree with DC as outlined by Dr. Thomes Dinning.  Please see my note of earlier today for further details.

## 2013-04-20 NOTE — Discharge Instructions (Signed)

## 2013-04-20 NOTE — Progress Notes (Signed)
Family Medicine Teaching Service Daily Progress Note Intern Pager: [phone]  Patient name: Kristina Rivera Medical record number: [id number] Date of birth: [birth year] Age: 53 y.o. Gender: female  Primary Care Provider: Gwendolyn Fill, MD Consultants: none Code Status: full  Pt Overview and Major Events to Date: pt admitted for chest pain r/o none  Assessment and Plan: Kristina Rivera is a 53 y.o. female presenting with chest pain. PMH significant for panic attacks, depression, asthma, and morbid obesity (BMI 54.7).   #1 Chest pain - Heart score of 3. Given history, physical exam, labs, EKG and risks factors.  -Troponin are negative X 3   -EKG this morning is pending and there are no alarms on telemetry since admission.                                  # Hx of Depression and Anxiety attacks  - Patient not on any medication for this at this time, most likely her symptoms are result of panic attacks. Pt declines "chemicals on her body long term". Psych evaluation will be part of our recommendations as outpatient follow up.     # Elevated BP  - Patient  with intermittent elevated blood pressures. She has no history of hypertension.  - After discussing with pt she refuses blood pressure medications at this time alligating that those high blood pressures are at the times when she feels the electronic fields.  - discuss risks of not treating this and she agree to have close follow up as outpatient with BP log.  # Elevated A1C x1 - Discussed with pt this result and offered f/u as outpatient vs start metformin upon discharge. She would prefer conservative management as long as her A1C are at goal (below 7). Pt interested in weight loss and DM diet education. Brief DM dietary education given. Will f/u closely as outpatient.   Asthma  - Stable.  - No evidence of exacerbation at this time.  FEN/GI: SL IV; Heart Healthy Diet  Prophylaxis: Lovenox  Disposition: discharge home after EKG is  reviewed and no changes are seen.   Subjective: feeling better, denies chest pain. Still with preoccupation abut energetic-electric fields around her.Thinking about moving to [place] or close to a [place] in order to avoid this. Denies hallucinations, suicidal ideations or plans.  Objective: Temp:  [98.2 F (36.8 C)-99.5 F (37.5 C)] 98.2 F (36.8 C) (02/07 0648) Pulse Rate:  [69-90] 72 (02/07 0648) Resp:  [16-21] 18 (02/07 0648) BP: (123-177)/(74-114) 131/79 mmHg (02/07 0648) SpO2:  [96 %-99 %] 96 % (02/07 0648) Weight:  [293 lb 1.6 oz (132.949 kg)-299 lb (135.626 kg)] 293 lb 1.6 oz (132.949 kg) (02/07 2951) Physical Exam: General: Obese female sitting up in bed, no acute distress.  HEENT: NCAT. Sclera anicteric.  Cardiovascular: RRR. No murmur appreciated.  Respiratory: CTAB. No rales, rhonchi, or wheezing.  Abdomen: Obese, soft, nontender, nondistended.  Extremities: No LE edema.  Skin: Warm, dry, intact.  Neuro: No focal deficits on exam.  Psych: Normal mood and affect. Patient perseverative regarding "electrical devices" and "electromagnetic field". No auditory or vissual hallucinations. No agitation. Normal speech and language.   Laboratory:  Recent Labs Lab 04/19/13 1705  WBC 9.5  HGB 12.8  HCT 39.3  PLT 332    Recent Labs Lab 04/19/13 1705  NA 142  K 4.2  CL 100  CO2 27  BUN 11  CREATININE 0.90  CALCIUM 9.6  GLUCOSE [place], MD 04/20/2013, 10:11 AM PGY-3 , [place] Intern pager: [phone], text pages welcome

## 2013-04-20 NOTE — Discharge Summary (Signed)
[facility] Discharge Summary  Patient name: Kristina Rivera Medical record number: [id number] Date of birth: 1961/02/08 Age: 53 y.o. Gender: female Date of Admission: 04/19/2013  Date of Discharge: 04/20/2013 Admitting Physician: Zigmund Gottron, MD  Primary Care Provider: Gwendolyn Fill, MD Consultants: none  Indication for Hospitalization: chest pain r/o  Discharge Diagnoses/Problem List:  Anxiety Elevated blood pressure associated with anxiety episodes. Elevated A1C Obesity   Disposition: discharge home  Discharge Condition: improved   Discharge Exam:  General: Obese female sitting up in bed, no acute distress.  HEENT: NCAT. Sclera anicteric.  Cardiovascular: RRR. No murmur appreciated.  Respiratory: CTAB. No rales, rhonchi, or wheezing.  Abdomen: Obese, soft, nontender, nondistended.  Extremities: No LE edema.  Skin: Warm, dry, intact.  Neuro: No focal deficits on exam.  Psych: Normal mood and affect. Patient perseverative regarding "electrical devices" and "electromagnetic field". No auditory or vissual hallucinations. No agitation. Normal speech and language.    Brief Hospital Course:  Kristina Rivera is a 53 y.o. female presenting with chest pain. PMH significant for panic attacks, depression, asthma, and morbid obesity (BMI 54.7).  #1 Chest pain - Heart score of 3. Given history, physical exam, labs, EKG and risks factors. Troponin are negative X 3, ekg findings are unchanged.   # Hx of Depression and Anxiety attacks  - Patient not on any medication for this at this time, most likely her symptoms are result of panic attacks. Pt declines "chemicals on her body long term". Psych evaluation will be part of our recommendations as outpatient follow up.   # Elevated BP  - Patient with intermittent elevated blood pressures. She has no history of hypertension.  - After discussing with pt she refuses blood pressure medications at this time  alligating that those high blood pressures are at the times when she feels the electronic fields.  - discuss risks of not treating this and she agree to have close follow up as outpatient with BP log.   # Elevated A1C x1  - Discussed with pt this result and offered f/u as outpatient vs start metformin upon discharge. She would prefer conservative management as long as her A1C are at goal (below 7). Pt interested in weight loss and DM diet education. Brief DM dietary education given. Will f/u closely as outpatient.   Asthma  - Stable.  - No evidence of exacerbation at this time.    Issues for Follow Up: Repeat A1C in 3 months, nutrition consult for DM education and weight loss. F/u blood pressure and possible start a BB to also help with anxiety spells. Consider SSRI if pt agreeable to take it at some point.   Significant Procedures: EKG, troponin, telemetry monitoring while impatient.   Significant Labs and Imaging:   Recent Labs Lab 04/19/13 1705  WBC 9.5  HGB 12.8  HCT 39.3  PLT 332    Recent Labs Lab 04/19/13 1705  NA 142  K 4.2  CL 100  CO2 27  GLUCOSE 89  BUN 11  CREATININE 0.90  CALCIUM 9.6    Results/Tests Pending at Time of Discharge: none  Discharge Medications:    Medication List         albuterol 108 (90 BASE) MCG/ACT inhaler  Commonly known as:  VENTOLIN HFA  Inhale 2 puffs into the lungs as needed.     TYLENOL 500 MG tablet  Generic drug:  acetaminophen  Take 1,000 mg by mouth every 8 (eight) hours.  Discharge Instructions: Please refer to Patient Instructions section of EMR for full details.  Patient was counseled important signs and symptoms that should prompt return to medical care, changes in medications, dietary instructions, activity restrictions, and follow up appointments.   Follow-Up Appointments:     Follow-up Information   Follow up with Gwendolyn Fill, MD. Schedule an appointment as soon as possible for a visit in 7 days.    Specialty:  Family Medicine   Contact information:   [address] [place] [address] [phone]       Lynore Coscia [place], MD 04/20/2013, 1:55 PM PGY 3, [place]

## 2013-04-20 NOTE — H&P (Signed)
Seen and examined.  Discussed with Dr. Lacinda Axon.  Agree with his management and documentation.  Briefly, Kristina Rivera has recurrent atypical chest pain.  She has the fixed belief that this is induced by radiation exposure (e.g. Cell phone radiation.)  She is otherwise rational and her husband supports this belief.  She is clearly anxious but is not interested in anti anxiety meds.  She feels that she is fine when she can cope by getting away from the radiation fields.  She feels better (by sitting in the bathroom away from monitors.)  She has ruled out for MI.  I agree with DC today - which patient would appreciate to allow her to return to her coping mechanisms.  While I am concerned about a significant psych issue, I do not think she requires inpatient evaluation.

## 2013-04-20 NOTE — Progress Notes (Signed)
Seen and examined.  See my co-sign of the H&PE for my note of today.  OK to DC to home.  The only thing which might help and that she might take is some low dose Beta blocker to help her BP when she experiences her spells of anxiety.  This would need to be sold as a BP medicine.  She is not interested in any antianxiety medications.

## 2013-04-20 NOTE — Progress Notes (Signed)
Pt c/o left sharp sided chest pain, radiating to her shoulder, 8/10. She also experience a headache with the chest pain. She had placed her telemetry box away in the bedside drawer, her pain started once she removed the telemetry box to turn over on her left side. EKG was obtained with no acute changes, VVS.   Pain started to subside on its own once telemetry box was moved back to the drawer.

## 2014-06-13 ENCOUNTER — Other Ambulatory Visit: Payer: Self-pay | Admitting: Family Medicine

## 2014-06-13 DIAGNOSIS — Z1231 Encounter for screening mammogram for malignant neoplasm of breast: Secondary | ICD-10-CM

## 2014-06-20 ENCOUNTER — Ambulatory Visit
Admission: RE | Admit: 2014-06-20 | Discharge: 2014-06-20 | Disposition: A | Discharge status: Home / Self Care | Payer: 59 | Source: Ambulatory Visit | Attending: Family Medicine | Admitting: Family Medicine

## 2014-06-20 DIAGNOSIS — Z1231 Encounter for screening mammogram for malignant neoplasm of breast: Secondary | ICD-10-CM

## 2014-06-24 ENCOUNTER — Other Ambulatory Visit: Payer: Self-pay | Admitting: Gastroenterology

## 2014-08-19 ENCOUNTER — Ambulatory Visit (HOSPITAL_COMMUNITY): Admission: RE | Admit: 2014-08-19 | Payer: 59 | Source: Ambulatory Visit | Admitting: Gastroenterology

## 2014-08-19 ENCOUNTER — Encounter (HOSPITAL_COMMUNITY): Admission: RE | Payer: Self-pay | Source: Ambulatory Visit

## 2014-08-19 SURGERY — COLONOSCOPY WITH PROPOFOL
Anesthesia: Monitor Anesthesia Care

## 2015-06-23 ENCOUNTER — Other Ambulatory Visit: Payer: Self-pay | Admitting: Family Medicine

## 2015-06-23 DIAGNOSIS — R9389 Abnormal findings on diagnostic imaging of other specified body structures: Secondary | ICD-10-CM

## 2015-06-30 ENCOUNTER — Other Ambulatory Visit: Payer: Self-pay

## 2015-07-07 ENCOUNTER — Other Ambulatory Visit: Payer: Self-pay | Admitting: Specialist

## 2015-07-07 DIAGNOSIS — G8929 Other chronic pain: Secondary | ICD-10-CM

## 2015-07-07 DIAGNOSIS — R51 Headache: Principal | ICD-10-CM

## 2015-07-08 ENCOUNTER — Other Ambulatory Visit: Payer: Self-pay

## 2015-07-15 ENCOUNTER — Other Ambulatory Visit: Payer: Self-pay

## 2015-09-22 ENCOUNTER — Other Ambulatory Visit (HOSPITAL_COMMUNITY)
Admission: RE | Admit: 2015-09-22 | Discharge: 2015-09-22 | Disposition: A | Discharge status: Home / Self Care | Payer: Commercial Managed Care - HMO | Source: Ambulatory Visit | Attending: Obstetrics and Gynecology | Admitting: Obstetrics and Gynecology

## 2015-09-22 ENCOUNTER — Other Ambulatory Visit: Payer: Self-pay | Admitting: Obstetrics and Gynecology

## 2015-09-22 DIAGNOSIS — D259 Leiomyoma of uterus, unspecified: Secondary | ICD-10-CM | POA: Diagnosis not present

## 2015-09-22 DIAGNOSIS — Z01419 Encounter for gynecological examination (general) (routine) without abnormal findings: Secondary | ICD-10-CM | POA: Diagnosis not present

## 2015-09-22 DIAGNOSIS — Z01411 Encounter for gynecological examination (general) (routine) with abnormal findings: Secondary | ICD-10-CM | POA: Diagnosis not present

## 2015-09-22 DIAGNOSIS — R938 Abnormal findings on diagnostic imaging of other specified body structures: Secondary | ICD-10-CM | POA: Diagnosis not present

## 2015-09-22 DIAGNOSIS — Z1151 Encounter for screening for human papillomavirus (HPV): Secondary | ICD-10-CM | POA: Diagnosis not present

## 2015-09-24 LAB — CYTOLOGY - PAP

## 2015-09-28 DIAGNOSIS — M549 Dorsalgia, unspecified: Secondary | ICD-10-CM | POA: Diagnosis not present

## 2015-09-28 DIAGNOSIS — E119 Type 2 diabetes mellitus without complications: Secondary | ICD-10-CM | POA: Diagnosis not present

## 2015-09-28 DIAGNOSIS — M1990 Unspecified osteoarthritis, unspecified site: Secondary | ICD-10-CM | POA: Diagnosis not present

## 2015-09-29 ENCOUNTER — Other Ambulatory Visit: Payer: Self-pay | Admitting: Family Medicine

## 2015-09-29 DIAGNOSIS — Z1231 Encounter for screening mammogram for malignant neoplasm of breast: Secondary | ICD-10-CM

## 2015-10-06 DIAGNOSIS — E119 Type 2 diabetes mellitus without complications: Secondary | ICD-10-CM | POA: Diagnosis not present

## 2015-10-06 DIAGNOSIS — H524 Presbyopia: Secondary | ICD-10-CM | POA: Diagnosis not present

## 2015-10-06 DIAGNOSIS — H5210 Myopia, unspecified eye: Secondary | ICD-10-CM | POA: Diagnosis not present

## 2015-10-07 ENCOUNTER — Ambulatory Visit
Admission: RE | Admit: 2015-10-07 | Discharge: 2015-10-07 | Disposition: A | Discharge status: Home / Self Care | Payer: Self-pay | Source: Ambulatory Visit | Attending: Family Medicine | Admitting: Family Medicine

## 2015-10-07 DIAGNOSIS — N858 Other specified noninflammatory disorders of uterus: Secondary | ICD-10-CM | POA: Diagnosis not present

## 2015-10-07 DIAGNOSIS — R938 Abnormal findings on diagnostic imaging of other specified body structures: Secondary | ICD-10-CM | POA: Diagnosis not present

## 2015-10-07 DIAGNOSIS — D259 Leiomyoma of uterus, unspecified: Secondary | ICD-10-CM | POA: Diagnosis not present

## 2015-10-07 DIAGNOSIS — Z1231 Encounter for screening mammogram for malignant neoplasm of breast: Secondary | ICD-10-CM

## 2016-12-16 ENCOUNTER — Other Ambulatory Visit: Payer: Self-pay | Admitting: Family Medicine

## 2016-12-16 DIAGNOSIS — Z1231 Encounter for screening mammogram for malignant neoplasm of breast: Secondary | ICD-10-CM

## 2016-12-28 ENCOUNTER — Ambulatory Visit: Payer: Self-pay

## 2017-01-10 ENCOUNTER — Ambulatory Visit: Payer: Self-pay

## 2017-02-09 DIAGNOSIS — H524 Presbyopia: Secondary | ICD-10-CM | POA: Diagnosis not present

## 2017-02-09 DIAGNOSIS — H5203 Hypermetropia, bilateral: Secondary | ICD-10-CM | POA: Diagnosis not present

## 2017-02-20 ENCOUNTER — Ambulatory Visit: Payer: Self-pay

## 2017-06-01 DIAGNOSIS — Z79899 Other long term (current) drug therapy: Secondary | ICD-10-CM | POA: Diagnosis not present

## 2017-06-01 DIAGNOSIS — J309 Allergic rhinitis, unspecified: Secondary | ICD-10-CM | POA: Diagnosis not present

## 2017-06-01 DIAGNOSIS — J4520 Mild intermittent asthma, uncomplicated: Secondary | ICD-10-CM | POA: Diagnosis not present

## 2017-06-01 DIAGNOSIS — R079 Chest pain, unspecified: Secondary | ICD-10-CM | POA: Diagnosis not present

## 2017-06-01 DIAGNOSIS — Z6841 Body Mass Index (BMI) 40.0 and over, adult: Secondary | ICD-10-CM | POA: Diagnosis not present

## 2017-06-01 DIAGNOSIS — E119 Type 2 diabetes mellitus without complications: Secondary | ICD-10-CM | POA: Diagnosis not present

## 2018-02-05 ENCOUNTER — Ambulatory Visit: Payer: Self-pay

## 2018-02-11 HISTORY — PX: BREAST BIOPSY: SHX20

## 2018-02-13 ENCOUNTER — Other Ambulatory Visit: Payer: Self-pay | Admitting: Family Medicine

## 2018-02-13 DIAGNOSIS — Z1231 Encounter for screening mammogram for malignant neoplasm of breast: Secondary | ICD-10-CM

## 2018-02-23 ENCOUNTER — Ambulatory Visit
Admission: RE | Admit: 2018-02-23 | Discharge: 2018-02-23 | Disposition: A | Discharge status: Home / Self Care | Payer: Medicare HMO | Source: Ambulatory Visit | Attending: Family Medicine | Admitting: Family Medicine

## 2018-02-23 DIAGNOSIS — Z1231 Encounter for screening mammogram for malignant neoplasm of breast: Secondary | ICD-10-CM

## 2018-02-27 ENCOUNTER — Other Ambulatory Visit: Payer: Self-pay | Admitting: Family Medicine

## 2018-02-27 DIAGNOSIS — R928 Other abnormal and inconclusive findings on diagnostic imaging of breast: Secondary | ICD-10-CM

## 2018-03-01 ENCOUNTER — Ambulatory Visit
Admission: RE | Admit: 2018-03-01 | Discharge: 2018-03-01 | Disposition: A | Discharge status: Home / Self Care | Payer: Medicare HMO | Source: Ambulatory Visit | Attending: Family Medicine | Admitting: Family Medicine

## 2018-03-01 ENCOUNTER — Other Ambulatory Visit: Payer: Self-pay | Admitting: Family Medicine

## 2018-03-01 DIAGNOSIS — N6320 Unspecified lump in the left breast, unspecified quadrant: Secondary | ICD-10-CM

## 2018-03-01 DIAGNOSIS — R928 Other abnormal and inconclusive findings on diagnostic imaging of breast: Secondary | ICD-10-CM

## 2018-03-01 DIAGNOSIS — N6313 Unspecified lump in the right breast, lower outer quadrant: Secondary | ICD-10-CM | POA: Diagnosis not present

## 2018-03-01 DIAGNOSIS — R922 Inconclusive mammogram: Secondary | ICD-10-CM | POA: Diagnosis not present

## 2018-03-01 DIAGNOSIS — N6002 Solitary cyst of left breast: Secondary | ICD-10-CM | POA: Diagnosis not present

## 2018-03-01 DIAGNOSIS — R2231 Localized swelling, mass and lump, right upper limb: Secondary | ICD-10-CM

## 2018-03-09 ENCOUNTER — Ambulatory Visit
Admission: RE | Admit: 2018-03-09 | Discharge: 2018-03-09 | Disposition: A | Discharge status: Home / Self Care | Payer: Medicare HMO | Source: Ambulatory Visit | Attending: Family Medicine | Admitting: Family Medicine

## 2018-03-09 ENCOUNTER — Other Ambulatory Visit: Payer: Self-pay | Admitting: Family Medicine

## 2018-03-09 DIAGNOSIS — R2231 Localized swelling, mass and lump, right upper limb: Secondary | ICD-10-CM

## 2018-03-09 DIAGNOSIS — N6313 Unspecified lump in the right breast, lower outer quadrant: Secondary | ICD-10-CM | POA: Diagnosis not present

## 2018-03-09 DIAGNOSIS — C50511 Malignant neoplasm of lower-outer quadrant of right female breast: Secondary | ICD-10-CM | POA: Diagnosis not present

## 2018-03-09 DIAGNOSIS — R928 Other abnormal and inconclusive findings on diagnostic imaging of breast: Secondary | ICD-10-CM

## 2018-03-09 DIAGNOSIS — N6320 Unspecified lump in the left breast, unspecified quadrant: Secondary | ICD-10-CM

## 2018-03-09 DIAGNOSIS — N6342 Unspecified lump in left breast, subareolar: Secondary | ICD-10-CM | POA: Diagnosis not present

## 2018-03-09 DIAGNOSIS — R590 Localized enlarged lymph nodes: Secondary | ICD-10-CM | POA: Diagnosis not present

## 2018-03-09 DIAGNOSIS — N6012 Diffuse cystic mastopathy of left breast: Secondary | ICD-10-CM | POA: Diagnosis not present

## 2018-03-09 DIAGNOSIS — Z171 Estrogen receptor negative status [ER-]: Secondary | ICD-10-CM | POA: Diagnosis not present

## 2018-03-14 HISTORY — PX: BREAST LUMPECTOMY: SHX2

## 2018-03-15 ENCOUNTER — Encounter: Payer: Self-pay | Admitting: *Deleted

## 2018-03-15 ENCOUNTER — Telehealth: Payer: Self-pay | Admitting: Oncology

## 2018-03-15 DIAGNOSIS — Z171 Estrogen receptor negative status [ER-]: Principal | ICD-10-CM

## 2018-03-15 DIAGNOSIS — C50511 Malignant neoplasm of lower-outer quadrant of right female breast: Secondary | ICD-10-CM | POA: Insufficient documentation

## 2018-03-15 NOTE — Telephone Encounter (Signed)
Spoke to patient to confirm afternoon [facility] appointment for 1/8 packet will be mailed to patient

## 2018-03-20 ENCOUNTER — Other Ambulatory Visit: Payer: Self-pay | Admitting: Genetics

## 2018-03-20 DIAGNOSIS — C50511 Malignant neoplasm of lower-outer quadrant of right female breast: Secondary | ICD-10-CM

## 2018-03-20 DIAGNOSIS — Z171 Estrogen receptor negative status [ER-]: Principal | ICD-10-CM

## 2018-03-21 ENCOUNTER — Encounter: Payer: Self-pay | Admitting: Physical Therapy

## 2018-03-21 ENCOUNTER — Other Ambulatory Visit: Payer: Self-pay | Admitting: *Deleted

## 2018-03-21 ENCOUNTER — Ambulatory Visit
Admission: RE | Admit: 2018-03-21 | Discharge: 2018-03-21 | Disposition: A | Discharge status: Home / Self Care | Payer: Medicare HMO | Source: Ambulatory Visit | Attending: Radiation Oncology | Admitting: Radiation Oncology

## 2018-03-21 ENCOUNTER — Inpatient Hospital Stay: Payer: Medicare HMO | Attending: Oncology | Admitting: Oncology

## 2018-03-21 ENCOUNTER — Encounter: Payer: Self-pay | Admitting: Oncology

## 2018-03-21 ENCOUNTER — Inpatient Hospital Stay: Payer: Medicare HMO

## 2018-03-21 ENCOUNTER — Encounter: Payer: Self-pay | Admitting: General Practice

## 2018-03-21 ENCOUNTER — Other Ambulatory Visit: Payer: Self-pay | Admitting: General Surgery

## 2018-03-21 ENCOUNTER — Other Ambulatory Visit: Payer: Self-pay

## 2018-03-21 ENCOUNTER — Ambulatory Visit (HOSPITAL_BASED_OUTPATIENT_CLINIC_OR_DEPARTMENT_OTHER): Payer: Medicare HMO | Admitting: Genetics

## 2018-03-21 ENCOUNTER — Ambulatory Visit: Payer: Medicare HMO | Attending: General Surgery | Admitting: Physical Therapy

## 2018-03-21 VITALS — BP 149/79 | HR 89 | Temp 99.0°F | Resp 18 | Ht 62.0 in | Wt 307.8 lb

## 2018-03-21 DIAGNOSIS — Z560 Unemployment, unspecified: Secondary | ICD-10-CM | POA: Diagnosis not present

## 2018-03-21 DIAGNOSIS — Z803 Family history of malignant neoplasm of breast: Secondary | ICD-10-CM | POA: Diagnosis not present

## 2018-03-21 DIAGNOSIS — R293 Abnormal posture: Secondary | ICD-10-CM | POA: Diagnosis not present

## 2018-03-21 DIAGNOSIS — M25611 Stiffness of right shoulder, not elsewhere classified: Secondary | ICD-10-CM | POA: Insufficient documentation

## 2018-03-21 DIAGNOSIS — C50511 Malignant neoplasm of lower-outer quadrant of right female breast: Secondary | ICD-10-CM

## 2018-03-21 DIAGNOSIS — Z801 Family history of malignant neoplasm of trachea, bronchus and lung: Secondary | ICD-10-CM | POA: Diagnosis not present

## 2018-03-21 DIAGNOSIS — Z808 Family history of malignant neoplasm of other organs or systems: Secondary | ICD-10-CM | POA: Diagnosis not present

## 2018-03-21 DIAGNOSIS — F410 Panic disorder [episodic paroxysmal anxiety] without agoraphobia: Secondary | ICD-10-CM

## 2018-03-21 DIAGNOSIS — G8929 Other chronic pain: Secondary | ICD-10-CM | POA: Insufficient documentation

## 2018-03-21 DIAGNOSIS — Z171 Estrogen receptor negative status [ER-]: Secondary | ICD-10-CM | POA: Diagnosis not present

## 2018-03-21 DIAGNOSIS — D242 Benign neoplasm of left breast: Secondary | ICD-10-CM | POA: Diagnosis not present

## 2018-03-21 DIAGNOSIS — Z6841 Body Mass Index (BMI) 40.0 and over, adult: Secondary | ICD-10-CM

## 2018-03-21 DIAGNOSIS — M25511 Pain in right shoulder: Secondary | ICD-10-CM | POA: Insufficient documentation

## 2018-03-21 LAB — CBC WITH DIFFERENTIAL (CANCER CENTER ONLY)
Abs Immature Granulocytes: 0.03 10*3/uL (ref 0.00–0.07)
BASOS ABS: 0 10*3/uL (ref 0.0–0.1)
Basophils Relative: 0 %
Eosinophils Absolute: 0.2 10*3/uL (ref 0.0–0.5)
Eosinophils Relative: 3 %
HCT: 43.1 % (ref 36.0–46.0)
Hemoglobin: 12.8 g/dL (ref 12.0–15.0)
IMMATURE GRANULOCYTES: 0 %
Lymphocytes Relative: 39 %
Lymphs Abs: 2.8 10*3/uL (ref 0.7–4.0)
MCH: 21.9 pg — ABNORMAL LOW (ref 26.0–34.0)
MCHC: 29.7 g/dL — ABNORMAL LOW (ref 30.0–36.0)
MCV: 73.7 fL — ABNORMAL LOW (ref 80.0–100.0)
Monocytes Absolute: 0.6 10*3/uL (ref 0.1–1.0)
Monocytes Relative: 8 %
Neutro Abs: 3.4 10*3/uL (ref 1.7–7.7)
Neutrophils Relative %: 50 %
PLATELETS: 310 10*3/uL (ref 150–400)
RBC: 5.85 MIL/uL — AB (ref 3.87–5.11)
RDW: 15.4 % (ref 11.5–15.5)
WBC Count: 7.1 10*3/uL (ref 4.0–10.5)
nRBC: 0 % (ref 0.0–0.2)

## 2018-03-21 LAB — CMP (CANCER CENTER ONLY)
ALT: 21 U/L (ref 0–44)
AST: 24 U/L (ref 15–41)
Albumin: 3.7 g/dL (ref 3.5–5.0)
Alkaline Phosphatase: 76 U/L (ref 38–126)
Anion gap: 8 (ref 5–15)
BUN: 10 mg/dL (ref 6–20)
CHLORIDE: 102 mmol/L (ref 98–111)
CO2: 30 mmol/L (ref 22–32)
Calcium: 9.6 mg/dL (ref 8.9–10.3)
Creatinine: 0.93 mg/dL (ref 0.44–1.00)
GFR, Est AFR Am: >60 mL/min (ref >60)
GFR, Estimated: >60 mL/min (ref >60)
Glucose, Bld: 138 mg/dL — ABNORMAL HIGH (ref 70–99)
Potassium: 4.4 mmol/L (ref 3.5–5.1)
Sodium: 140 mmol/L (ref 135–145)
Total Bilirubin: 0.6 mg/dL (ref 0.3–1.2)
Total Protein: 8.2 g/dL — ABNORMAL HIGH (ref 6.5–8.1)

## 2018-03-21 NOTE — Progress Notes (Signed)
[facility] Psychosocial Distress Screening Spiritual Care  Met with Kristina Rivera and her husband Kristina Rivera in [facility] to introduce [facility] team/resources, reviewing distress screen per protocol.  The patient scored a 7 on the Psychosocial Distress Thermometer which indicates severe distress. Also assessed for distress and other psychosocial needs.   ONCBCN DISTRESS SCREENING 03/21/2018  Screening Type Initial Screening  Distress experienced in past week (1-10) 7  Family Problem type Children  Emotional problem type Adjusting to illness  Physical Problem type Pain;Breathing  Referral to support programs Yes    Per pt, she indicated distress related to her son, 3, because he uses wifi to play video games, which bothers her electromagnetic field sensitivity. Couple reports decreased distress upon meeting team and learning plan of care. Introduced [employer], encouraging participation as a way to help with adjustment and coping. Kristina Rivera verbalized interest in Look Good, Feel Better and Registered Dietician appt.  Follow up needed: No. Per couple, no other needs at this time, but they know to contact Team as desired. Please also page if needs arise or circumstances change. Thank you.   [place], [place], [facility] Pager [phone] Voicemail [phone]

## 2018-03-21 NOTE — Patient Instructions (Signed)

## 2018-03-21 NOTE — Progress Notes (Signed)
Radiation Oncology         [phone] ________________________________  Multidisciplinary Breast Oncology Clinic [facility]) Initial Outpatient Consultation  Name: Kristina Rivera MRN: [id number]  Date: 03/21/2018  DOB: 1960-09-03  CC:Jonathon Jordan, MD  Stark Klein, MD   REFERRING PHYSICIAN: Stark Klein, MD  DIAGNOSIS: The encounter diagnosis was Malignant neoplasm of lower-outer quadrant of right breast of female, estrogen receptor negative ([place]). Stage IIB (cT2, cN0, cN0) right Breast LOQ Invasive Ductal Carcinoma, ER- / PR- / Her2-, Grade 3    ICD-10-CM   1. Malignant neoplasm of lower-outer quadrant of right breast of female, estrogen receptor negative ([place]) C50.511    Z17.1     HISTORY OF PRESENT ILLNESS::Kristina Rivera is a 58 y.o. female who is presenting to the office today for evaluation of her newly diagnosed breast cancer. She is accompanied by her husband. She is doing well overall.   She had routine screening mammography on 02/23/18 showing a possible abnormality in both breasts. She underwent bilateral diagnostic mammography with tomography and bilateral breast ultrasonography at [facility] on 12/19 showing: 3 cm mass in the 8 o'clock position of the right breast highly suspicious for breast malignancy. Two borderline abnormal right axillary lymph nodes with cortical thicknesses of 4 mm. Indeterminate mass or lesion in the 2 o'clock retroareolar region of the left breast measuring 6 mm. No left axillary adenopathy was noted.  Biopsy on 12/27 showed: Breast, right, needle core biopsy, 8 o'clock - high grade carcinoma with necrosis. In her lymph node, needle/core biopsy, right axilla - benign lymph node. In the left breast, needle core biopsy at the 2 o'clock retroareolar position - benign breast parenchyma with fibrocystic change and usual ductal hyperplasia, negative for carcinoma. Prognostic indicators significant for: estrogen receptor, 0% negative and progesterone  receptor, 0% negative. Proliferation marker Ki67 at 80%. HER2 negative.  Menarche: 58 years old Age at first live birth: 58 years old GP: GxP5 LMP: age 89 Contraceptive: no HRT: no   The patient was referred today for presentation in the multidisciplinary conference.  Radiology studies and pathology slides were presented there for review and discussion of treatment options.  A consensus was discussed regarding potential next steps.  PREVIOUS RADIATION THERAPY: No  PAST MEDICAL HISTORY:  has a past medical history of Anxiety, Arthritis, Asthma, Chronic lower back pain, Daily headache, and Depression.    PAST SURGICAL HISTORY: Past Surgical History:  Procedure Laterality Date  . CARPAL TUNNEL RELEASE Left 1999?  . TUBAL LIGATION  1993    FAMILY HISTORY: family history includes Breast cancer (age of onset: 74) in her maternal aunt; Head & neck cancer (age of onset: 47) in her sister; Lung cancer (age of onset: 47) in her mother; Pneumonia in her mother; Stroke in her father.  SOCIAL HISTORY:  reports that she has never smoked. She has never used smokeless tobacco. She reports that she does not drink alcohol or use drugs.  ALLERGIES: Nsaids and Aspirin  MEDICATIONS:  Current Outpatient Medications  Medication Sig Dispense Refill  . acetaminophen (TYLENOL) 500 MG tablet Take 1,000 mg by mouth every 8 (eight) hours.      Marland Kitchen albuterol (VENTOLIN HFA) 108 (90 BASE) MCG/ACT inhaler Inhale 2 puffs into the lungs as needed. 1 Inhaler 6   No current facility-administered medications for this encounter.     REVIEW OF SYSTEMS:  REVIEW OF SYSTEMS: A 10+ POINT REVIEW OF SYSTEMS WAS OBTAINED including neurology, dermatology, psychiatry, cardiac, respiratory, lymph, extremities, GI,  GU, musculoskeletal, constitutional, reproductive, HEENT.On the provided form, she reports difficulty swallowing, bilateral breast and pelvic pain, blurred vision, chest pain and heart palpitations, SOB, abdominal pain,  back and joint pain, headaches, weakness, fainting, numbness and forgetfulness, anxiety, depression, and anemia. She denies any other symptoms.    PHYSICAL EXAM:  Vitals with BMI 03/21/2018  Height 5' 2"  Weight 307 lbs 13 oz  BMI 41.32  Systolic 440  Diastolic 79  Pulse 89  Respirations 18  Patient wears a silver lined scarf to help protect from electromagnetic radiation throughout the evaluation Lungs are clear to auscultation bilaterally. Heart has regular rate and rhythm. No palpable cervical, supraclavicular, or axillary adenopathy. Abdomen soft, non-tender, normal bowel sounds. Breast: left breast with no palpable mass, nipple discharge, or bleeding. right breast with a palpable area of induration measuring approximately 3cm in the lower outer quadrant. Likely a combo of bruising and tumor. No nipple discharge or bleeding.  ECOG = 1  0 - Asymptomatic (Fully active, able to carry on all predisease activities without restriction)  1 - Symptomatic but completely ambulatory (Restricted in physically strenuous activity but ambulatory and able to carry out work of a light or sedentary nature. For example, light housework, office work)  2 - Symptomatic, <50% in bed during the day (Ambulatory and capable of all self care but unable to carry out any work activities. Up and about more than 50% of waking hours)  3 - Symptomatic, >50% in bed, but not bedbound (Capable of only limited self-care, confined to bed or chair 50% or more of waking hours)  4 - Bedbound (Completely disabled. Cannot carry on any self-care. Totally confined to bed or chair)  5 - Death   Eustace Pen MM, Creech RH, Tormey DC, et al. [phone]). "Toxicity and response criteria of the [facility]". [place] Oncol. 5 (6): 649-55  LABORATORY DATA:  Lab Results  Component Value Date   WBC 7.1 03/21/2018   HGB 12.8 03/21/2018   HCT 43.1 03/21/2018   MCV 73.7 (L) 03/21/2018   PLT 310 03/21/2018   Lab  Results  Component Value Date   NA 140 03/21/2018   K 4.4 03/21/2018   CL 102 03/21/2018   CO2 30 03/21/2018   Lab Results  Component Value Date   ALT 21 03/21/2018   AST 24 03/21/2018   ALKPHOS 76 03/21/2018   BILITOT 0.6 03/21/2018    PULMONARY FUNCTION TEST:   Recent Review Flowsheet Data    There is no flowsheet data to display.      RADIOGRAPHY: US Breast Ltd Uni Left Inc Axilla  Result Date: 03/01/2018 CLINICAL DATA:  Screening recall for possible masses in each breast. EXAM: DIGITAL DIAGNOSTIC BILATERAL MAMMOGRAM WITH CAD AND TOMO ULTRASOUND BILATERAL BREAST COMPARISON:  Previous exam(s). ACR Breast Density Category c: The breast tissue is heterogeneously dense, which may obscure small masses. FINDINGS: In the right breast, the possible mass persists. It has partly lobulated and partly indistinct margins. It lies in the lower outer quadrant of the right breast. No other right breast masses. No architectural distortion. No suspicious calcifications. In the left breast, the possible mass persists as an oval mostly circumscribed retroareolar mass. No other left breast masses, no architectural distortion and no suspicious calcifications. Mammographic images were processed with CAD. On physical exam, there is a palpable, firm, somewhat tender lump along the 8 o'clock position of the right breast. No palpable retroareolar left breast masses. Targeted right breast ultrasound is  performed, showing a hypoechoic mass with irregular and partly ill-defined margins in the right breast at 8 o'clock, 7 cm the nipple, measuring 3.0 x 1.6 x 2.4 cm. It has internal vascularity on color Doppler analysis. Sonographic evaluation of the right axilla shows several mildly prominent lymph nodes, 2 with cortical thickness is of 4 mm. All nodes have normal overall shapes with hilar fat. Targeted left breast ultrasound is performed, showing an oval circumscribed cyst in the 1 o'clock position of the left breast,  2 cm the nipple, measuring 6 x 3 x 4 mm. This is consistent in size, shape and location to the mammographic finding. Also on the left in the retroareolar region is an irregular focal hypoechogenic area with posterior acoustic shadowing. This lies at 2 o'clock, 1 cm from the nipple measuring approximately 6 x 4 x 5 mm. Sonographic evaluation of the left axilla shows no enlarged or abnormal lymph nodes. IMPRESSION: 1. 3 cm mass in the 8 o'clock position of the right breast highly suspicious for breast malignancy. 2. Two borderline abnormal right axillary lymph nodes with cortical thicknesses of 4 mm. 3. Indeterminate mass or lesion in the 2 o'clock retroareolar region of the left breast measuring 6 mm. No left axillary adenopathy. RECOMMENDATION: 1. Ultrasound-guided core needle biopsy of the 8 o'clock position, 3 cm right breast mass. 2. Ultrasound-guided core needle biopsy of 1 of the 2 borderline abnormal right axillary lymph nodes. 3. Ultrasound-guided core needle biopsy of the 6 mm, 2 o'clock position retroareolar left breast lesion. I have discussed the findings and recommendations with the patient. Results were also provided in writing at the conclusion of the visit. If applicable, a reminder letter will be sent to the patient regarding the next appointment. BI-RADS CATEGORY  5: Highly suggestive of malignancy. Electronically Signed   By: Lajean Manes M.D.   On: 03/01/2018 09:43   US Breast Ltd Uni Right Inc Axilla  Result Date: 03/01/2018 CLINICAL DATA:  Screening recall for possible masses in each breast. EXAM: DIGITAL DIAGNOSTIC BILATERAL MAMMOGRAM WITH CAD AND TOMO ULTRASOUND BILATERAL BREAST COMPARISON:  Previous exam(s). ACR Breast Density Category c: The breast tissue is heterogeneously dense, which may obscure small masses. FINDINGS: In the right breast, the possible mass persists. It has partly lobulated and partly indistinct margins. It lies in the lower outer quadrant of the right breast. No  other right breast masses. No architectural distortion. No suspicious calcifications. In the left breast, the possible mass persists as an oval mostly circumscribed retroareolar mass. No other left breast masses, no architectural distortion and no suspicious calcifications. Mammographic images were processed with CAD. On physical exam, there is a palpable, firm, somewhat tender lump along the 8 o'clock position of the right breast. No palpable retroareolar left breast masses. Targeted right breast ultrasound is performed, showing a hypoechoic mass with irregular and partly ill-defined margins in the right breast at 8 o'clock, 7 cm the nipple, measuring 3.0 x 1.6 x 2.4 cm. It has internal vascularity on color Doppler analysis. Sonographic evaluation of the right axilla shows several mildly prominent lymph nodes, 2 with cortical thickness is of 4 mm. All nodes have normal overall shapes with hilar fat. Targeted left breast ultrasound is performed, showing an oval circumscribed cyst in the 1 o'clock position of the left breast, 2 cm the nipple, measuring 6 x 3 x 4 mm. This is consistent in size, shape and location to the mammographic finding. Also on the left in the retroareolar region is an  irregular focal hypoechogenic area with posterior acoustic shadowing. This lies at 2 o'clock, 1 cm from the nipple measuring approximately 6 x 4 x 5 mm. Sonographic evaluation of the left axilla shows no enlarged or abnormal lymph nodes. IMPRESSION: 1. 3 cm mass in the 8 o'clock position of the right breast highly suspicious for breast malignancy. 2. Two borderline abnormal right axillary lymph nodes with cortical thicknesses of 4 mm. 3. Indeterminate mass or lesion in the 2 o'clock retroareolar region of the left breast measuring 6 mm. No left axillary adenopathy. RECOMMENDATION: 1. Ultrasound-guided core needle biopsy of the 8 o'clock position, 3 cm right breast mass. 2. Ultrasound-guided core needle biopsy of 1 of the 2  borderline abnormal right axillary lymph nodes. 3. Ultrasound-guided core needle biopsy of the 6 mm, 2 o'clock position retroareolar left breast lesion. I have discussed the findings and recommendations with the patient. Results were also provided in writing at the conclusion of the visit. If applicable, a reminder letter will be sent to the patient regarding the next appointment. BI-RADS CATEGORY  5: Highly suggestive of malignancy. Electronically Signed   By: Lajean Manes M.D.   On: 03/01/2018 09:43   Mm Diag Breast Tomo Bilateral  Result Date: 03/01/2018 CLINICAL DATA:  Screening recall for possible masses in each breast. EXAM: DIGITAL DIAGNOSTIC BILATERAL MAMMOGRAM WITH CAD AND TOMO ULTRASOUND BILATERAL BREAST COMPARISON:  Previous exam(s). ACR Breast Density Category c: The breast tissue is heterogeneously dense, which may obscure small masses. FINDINGS: In the right breast, the possible mass persists. It has partly lobulated and partly indistinct margins. It lies in the lower outer quadrant of the right breast. No other right breast masses. No architectural distortion. No suspicious calcifications. In the left breast, the possible mass persists as an oval mostly circumscribed retroareolar mass. No other left breast masses, no architectural distortion and no suspicious calcifications. Mammographic images were processed with CAD. On physical exam, there is a palpable, firm, somewhat tender lump along the 8 o'clock position of the right breast. No palpable retroareolar left breast masses. Targeted right breast ultrasound is performed, showing a hypoechoic mass with irregular and partly ill-defined margins in the right breast at 8 o'clock, 7 cm the nipple, measuring 3.0 x 1.6 x 2.4 cm. It has internal vascularity on color Doppler analysis. Sonographic evaluation of the right axilla shows several mildly prominent lymph nodes, 2 with cortical thickness is of 4 mm. All nodes have normal overall shapes with  hilar fat. Targeted left breast ultrasound is performed, showing an oval circumscribed cyst in the 1 o'clock position of the left breast, 2 cm the nipple, measuring 6 x 3 x 4 mm. This is consistent in size, shape and location to the mammographic finding. Also on the left in the retroareolar region is an irregular focal hypoechogenic area with posterior acoustic shadowing. This lies at 2 o'clock, 1 cm from the nipple measuring approximately 6 x 4 x 5 mm. Sonographic evaluation of the left axilla shows no enlarged or abnormal lymph nodes. IMPRESSION: 1. 3 cm mass in the 8 o'clock position of the right breast highly suspicious for breast malignancy. 2. Two borderline abnormal right axillary lymph nodes with cortical thicknesses of 4 mm. 3. Indeterminate mass or lesion in the 2 o'clock retroareolar region of the left breast measuring 6 mm. No left axillary adenopathy. RECOMMENDATION: 1. Ultrasound-guided core needle biopsy of the 8 o'clock position, 3 cm right breast mass. 2. Ultrasound-guided core needle biopsy of 1 of the 2 borderline  abnormal right axillary lymph nodes. 3. Ultrasound-guided core needle biopsy of the 6 mm, 2 o'clock position retroareolar left breast lesion. I have discussed the findings and recommendations with the patient. Results were also provided in writing at the conclusion of the visit. If applicable, a reminder letter will be sent to the patient regarding the next appointment. BI-RADS CATEGORY  5: Highly suggestive of malignancy. Electronically Signed   By: Lajean Manes M.D.   On: 03/01/2018 09:43   Mm 3d Screen Breast Bilateral  Result Date: 02/26/2018 CLINICAL DATA:  Screening. EXAM: DIGITAL SCREENING BILATERAL MAMMOGRAM WITH TOMO AND CAD COMPARISON:  Previous exam(s). ACR Breast Density Category c: The breast tissue is heterogeneously dense, which may obscure small masses. FINDINGS: In the right breast mass requires further evaluation. In the left breast mass requires further  evaluation. Images were processed with CAD. IMPRESSION: Further evaluation is suggested for possible mass in the right breast. Further evaluation is suggested for possible mass in the left breast. RECOMMENDATION: Diagnostic mammogram and possibly ultrasound of both breasts. (Code:[id number]-[id number]) The patient will be contacted regarding the findings, and additional imaging will be scheduled. BI-RADS CATEGORY  0: Incomplete. Need additional imaging evaluation and/or prior mammograms for comparison. Electronically Signed   By: Lovey Newcomer M.D.   On: 02/26/2018 10:15   [place] Axillary Node Core Biopsy Right  Addendum Date: 03/15/2018   ADDENDUM REPORT: 03/15/2018 09:38 ADDENDUM: Pathology revealed HIGH GRADE CARCINOMA WITH NECROSIS of the RIGHT breast, 8 o'clock. This immunoprofile, though not diagnostic, is compatible with a primary breast ductal carcinoma in the absence of any other primary lesions. This was found to be concordant by Dr. Claudie Revering. Pathology revealed BENIGN LYMPH NODE of RIGHT axilla. This was found to be concordant by Dr. Claudie Revering. Pathology revealed BENIGN BREAST PARENCHYMA WITH FIBROCYSTIC CHANGE AND USUAL DUCTAL HYPERPLASIA of the LEFT breast, 2 o'clock retroareolar. This was found to be concordant by Dr. Claudie Revering. Pathology results were discussed with the patient by telephone. The patient reported doing well after the biopsies with tenderness at the sites. Post biopsy instructions and care were reviewed and questions were answered. The patient was encouraged to call [facility] for any additional concerns. The patient was referred to [facility] at [facility] on March 21, 2018. Recommendation consideration of a bilateral breast MRI and/or PET-CT for further evaluation since the pathology results are not definitive for a primary breast carcinoma. Pathology results reported by Terie Purser, RN on 03/15/2018.  Electronically Signed   By: Claudie Revering M.D.   On: 03/15/2018 09:38   Result Date: 03/15/2018 CLINICAL DATA:  3 cm mass in the 8 o'clock position of the right breast with imaging features highly suspicious for malignancy. Two borderline abnormal right axillary lymph nodes with mild cortical thickening. 6 mm indeterminate mass in the 2 o'clock retroareolar left breast. EXAM: ULTRASOUND GUIDED BILATERAL BREAST CORE NEEDLE BIOPSY X 3 COMPARISON:  Previous exam(s). FINDINGS: I met with the patient and we discussed the procedure of ultrasound-guided biopsy, including benefits and alternatives. We discussed the high likelihood of a successful procedure. We discussed the risks of the procedure, including infection, bleeding, tissue injury, clip migration, and inadequate sampling. Informed written consent was given. The usual time-out protocol was performed immediately prior to the procedure. SITE #1: 8 O'CLOCK RIGHT BREAST Lesion quadrant: Lower outer quadrant Using sterile technique and 1% Lidocaine as local anesthetic, under direct ultrasound visualization, a 12 gauge  spring-loaded device was used to perform biopsy of the recently demonstrated 3.0 cm mass in the 8 o'clock position of the right breast, 7 cm from the nipple, using a caudal approach. Preliminary ultrasound of that area demonstrated multiple cystic-appearing areas within the 3.0 cm mass. At the conclusion of the procedure a ribbon shaped tissue marker clip was deployed into the biopsy cavity. Follow up 2 view mammogram was performed and dictated separately. SITE #2: RIGHT AXILLA Using sterile technique and 1% Lidocaine as local anesthetic, under direct ultrasound visualization, a 14 gauge spring-loaded device was used to perform biopsy of 1 of the previously demonstrated 2 right axillary lymph nodes demonstrating mild cortical thickening using a caudal approach. At the conclusion of the procedure a spiral shaped HydroMARK tissue marker clip was deployed  into the biopsy cavity. Follow up 2 view mammogram was performed and dictated separately. SITE #3: 2 O'CLOCK RETROAREOLAR LEFT BREAST Lesion quadrant: Upper outer quadrant Using sterile technique and 1% Lidocaine as local anesthetic, under direct ultrasound visualization, a 12 gauge spring-loaded device was used to perform biopsy of the recently demonstrated 6 mm mass in the 2 o'clock retroareolar left breast using a inferolateral approach. At the conclusion of the procedure a coil shaped tissue marker clip was deployed into the biopsy cavity. Follow up 2 view mammogram was performed and dictated separately. IMPRESSION: Ultrasound guided biopsy of a 3.0 cm 8 o'clock right breast mass, right axillary lymph node with mild cortical thickening and 6 mm 2 o'clock retroareolar left breast mass. No apparent complications. Electronically Signed: By: Claudie Revering M.D. On: 03/09/2018 14:37   Mm Clip Placement Left  Result Date: 03/09/2018 CLINICAL DATA:  Status post ultrasound-guided core needle biopsy of a 3 cm mass in the 8 o'clock position of the right breast, right axillary lymph node with mild cortical thickening and 6 mm mass in the 2 o'clock retroareolar left breast. EXAM: DIAGNOSTIC BILATERAL MAMMOGRAM POST ULTRASOUND BIOPSY X 3 COMPARISON:  Previous exam(s). FINDINGS: Mammographic images were obtained following ultrasound guided biopsy of a 3 cm mass in the 8 o'clock position of the right breast, right axillary lymph node and 6 mm mass in the 2 o'clock retroareolar left breast. These demonstrate a ribbon shaped biopsy marker clip within the biopsied mass in the 8 o'clock position of the right breast and a spiral shaped HydroMARK biopsy marker clip in the biopsied right axillary lymph node. A coil shaped biopsy marker clip is at the expected location of the biopsied mass in the 2 o'clock retroareolar left breast. IMPRESSION: Appropriate clip deployment following right breast, right axillary and left breast  ultrasound-guided core needle biopsies. Final Assessment: Post Procedure Mammograms for Marker Placement Electronically Signed   By: Claudie Revering M.D.   On: 03/09/2018 16:49   Mm Clip Placement Right  Result Date: 03/09/2018 CLINICAL DATA:  Status post ultrasound-guided core needle biopsy of a 3 cm mass in the 8 o'clock position of the right breast, right axillary lymph node with mild cortical thickening and 6 mm mass in the 2 o'clock retroareolar left breast. EXAM: DIAGNOSTIC BILATERAL MAMMOGRAM POST ULTRASOUND BIOPSY X 3 COMPARISON:  Previous exam(s). FINDINGS: Mammographic images were obtained following ultrasound guided biopsy of a 3 cm mass in the 8 o'clock position of the right breast, right axillary lymph node and 6 mm mass in the 2 o'clock retroareolar left breast. These demonstrate a ribbon shaped biopsy marker clip within the biopsied mass in the 8 o'clock position of the right breast and a  spiral shaped HydroMARK biopsy marker clip in the biopsied right axillary lymph node. A coil shaped biopsy marker clip is at the expected location of the biopsied mass in the 2 o'clock retroareolar left breast. IMPRESSION: Appropriate clip deployment following right breast, right axillary and left breast ultrasound-guided core needle biopsies. Final Assessment: Post Procedure Mammograms for Marker Placement Electronically Signed   By: Claudie Revering M.D.   On: 03/09/2018 16:49   [place] Lt Breast Bx W Loc Dev 1st Lesion Img Bx Spec US Guide  Addendum Date: 03/15/2018   ADDENDUM REPORT: 03/15/2018 09:38 ADDENDUM: Pathology revealed HIGH GRADE CARCINOMA WITH NECROSIS of the RIGHT breast, 8 o'clock. This immunoprofile, though not diagnostic, is compatible with a primary breast ductal carcinoma in the absence of any other primary lesions. This was found to be concordant by Dr. Claudie Revering. Pathology revealed BENIGN LYMPH NODE of RIGHT axilla. This was found to be concordant by Dr. Claudie Revering. Pathology revealed BENIGN  BREAST PARENCHYMA WITH FIBROCYSTIC CHANGE AND USUAL DUCTAL HYPERPLASIA of the LEFT breast, 2 o'clock retroareolar. This was found to be concordant by Dr. Claudie Revering. Pathology results were discussed with the patient by telephone. The patient reported doing well after the biopsies with tenderness at the sites. Post biopsy instructions and care were reviewed and questions were answered. The patient was encouraged to call [facility] for any additional concerns. The patient was referred to [facility] on March 21, 2018. Recommendation consideration of a bilateral breast MRI and/or PET-CT for further evaluation since the pathology results are not definitive for a primary breast carcinoma. Pathology results reported by Terie Purser, RN on 03/15/2018. Electronically Signed   By: Claudie Revering M.D.   On: 03/15/2018 09:38   Result Date: 03/15/2018 CLINICAL DATA:  3 cm mass in the 8 o'clock position of the right breast with imaging features highly suspicious for malignancy. Two borderline abnormal right axillary lymph nodes with mild cortical thickening. 6 mm indeterminate mass in the 2 o'clock retroareolar left breast. EXAM: ULTRASOUND GUIDED BILATERAL BREAST CORE NEEDLE BIOPSY X 3 COMPARISON:  Previous exam(s). FINDINGS: I met with the patient and we discussed the procedure of ultrasound-guided biopsy, including benefits and alternatives. We discussed the high likelihood of a successful procedure. We discussed the risks of the procedure, including infection, bleeding, tissue injury, clip migration, and inadequate sampling. Informed written consent was given. The usual time-out protocol was performed immediately prior to the procedure. SITE #1: 8 O'CLOCK RIGHT BREAST Lesion quadrant: Lower outer quadrant Using sterile technique and 1% Lidocaine as local anesthetic, under direct ultrasound visualization, a 12 gauge  spring-loaded device was used to perform biopsy of the recently demonstrated 3.0 cm mass in the 8 o'clock position of the right breast, 7 cm from the nipple, using a caudal approach. Preliminary ultrasound of that area demonstrated multiple cystic-appearing areas within the 3.0 cm mass. At the conclusion of the procedure a ribbon shaped tissue marker clip was deployed into the biopsy cavity. Follow up 2 view mammogram was performed and dictated separately. SITE #2: RIGHT AXILLA Using sterile technique and 1% Lidocaine as local anesthetic, under direct ultrasound visualization, a 14 gauge spring-loaded device was used to perform biopsy of 1 of the previously demonstrated 2 right axillary lymph nodes demonstrating mild cortical thickening using a caudal approach. At the conclusion of the procedure a spiral shaped HydroMARK tissue marker clip was deployed into the biopsy cavity. Follow  up 2 view mammogram was performed and dictated separately. SITE #3: 2 O'CLOCK RETROAREOLAR LEFT BREAST Lesion quadrant: Upper outer quadrant Using sterile technique and 1% Lidocaine as local anesthetic, under direct ultrasound visualization, a 12 gauge spring-loaded device was used to perform biopsy of the recently demonstrated 6 mm mass in the 2 o'clock retroareolar left breast using a inferolateral approach. At the conclusion of the procedure a coil shaped tissue marker clip was deployed into the biopsy cavity. Follow up 2 view mammogram was performed and dictated separately. IMPRESSION: Ultrasound guided biopsy of a 3.0 cm 8 o'clock right breast mass, right axillary lymph node with mild cortical thickening and 6 mm 2 o'clock retroareolar left breast mass. No apparent complications. Electronically Signed: By: Claudie Revering M.D. On: 03/09/2018 14:37   [place] Rt Breast Bx W Loc Dev 1st Lesion Img Bx Spec US Guide  Addendum Date: 03/15/2018   ADDENDUM REPORT: 03/15/2018 09:38 ADDENDUM: Pathology revealed HIGH GRADE CARCINOMA WITH NECROSIS  of the RIGHT breast, 8 o'clock. This immunoprofile, though not diagnostic, is compatible with a primary breast ductal carcinoma in the absence of any other primary lesions. This was found to be concordant by Dr. Claudie Revering. Pathology revealed BENIGN LYMPH NODE of RIGHT axilla. This was found to be concordant by Dr. Claudie Revering. Pathology revealed BENIGN BREAST PARENCHYMA WITH FIBROCYSTIC CHANGE AND USUAL DUCTAL HYPERPLASIA of the LEFT breast, 2 o'clock retroareolar. This was found to be concordant by Dr. Claudie Revering. Pathology results were discussed with the patient by telephone. The patient reported doing well after the biopsies with tenderness at the sites. Post biopsy instructions and care were reviewed and questions were answered. The patient was encouraged to call [facility] for any additional concerns. The patient was referred to [facility] on March 21, 2018. Recommendation consideration of a bilateral breast MRI and/or PET-CT for further evaluation since the pathology results are not definitive for a primary breast carcinoma. Pathology results reported by Terie Purser, RN on 03/15/2018. Electronically Signed   By: Claudie Revering M.D.   On: 03/15/2018 09:38   Result Date: 03/15/2018 CLINICAL DATA:  3 cm mass in the 8 o'clock position of the right breast with imaging features highly suspicious for malignancy. Two borderline abnormal right axillary lymph nodes with mild cortical thickening. 6 mm indeterminate mass in the 2 o'clock retroareolar left breast. EXAM: ULTRASOUND GUIDED BILATERAL BREAST CORE NEEDLE BIOPSY X 3 COMPARISON:  Previous exam(s). FINDINGS: I met with the patient and we discussed the procedure of ultrasound-guided biopsy, including benefits and alternatives. We discussed the high likelihood of a successful procedure. We discussed the risks of the procedure, including infection, bleeding,  tissue injury, clip migration, and inadequate sampling. Informed written consent was given. The usual time-out protocol was performed immediately prior to the procedure. SITE #1: 8 O'CLOCK RIGHT BREAST Lesion quadrant: Lower outer quadrant Using sterile technique and 1% Lidocaine as local anesthetic, under direct ultrasound visualization, a 12 gauge spring-loaded device was used to perform biopsy of the recently demonstrated 3.0 cm mass in the 8 o'clock position of the right breast, 7 cm from the nipple, using a caudal approach. Preliminary ultrasound of that area demonstrated multiple cystic-appearing areas within the 3.0 cm mass. At the conclusion of the procedure a ribbon shaped tissue marker clip was deployed into the biopsy cavity. Follow up 2 view mammogram was performed and dictated separately. SITE #2: RIGHT AXILLA Using sterile  technique and 1% Lidocaine as local anesthetic, under direct ultrasound visualization, a 14 gauge spring-loaded device was used to perform biopsy of 1 of the previously demonstrated 2 right axillary lymph nodes demonstrating mild cortical thickening using a caudal approach. At the conclusion of the procedure a spiral shaped HydroMARK tissue marker clip was deployed into the biopsy cavity. Follow up 2 view mammogram was performed and dictated separately. SITE #3: 2 O'CLOCK RETROAREOLAR LEFT BREAST Lesion quadrant: Upper outer quadrant Using sterile technique and 1% Lidocaine as local anesthetic, under direct ultrasound visualization, a 12 gauge spring-loaded device was used to perform biopsy of the recently demonstrated 6 mm mass in the 2 o'clock retroareolar left breast using a inferolateral approach. At the conclusion of the procedure a coil shaped tissue marker clip was deployed into the biopsy cavity. Follow up 2 view mammogram was performed and dictated separately. IMPRESSION: Ultrasound guided biopsy of a 3.0 cm 8 o'clock right breast mass, right axillary lymph node with mild  cortical thickening and 6 mm 2 o'clock retroareolar left breast mass. No apparent complications. Electronically Signed: By: Claudie Revering M.D. On: 03/09/2018 14:37      IMPRESSION: Stage IIB (cT2, cN0, cN0) high grade triple negative right breast cancer  Patient will be a good candidate for breast conservation with radiotherapy to right breast. Patient is unsure whether she would be able to tolerate radiation given her sensitivity to electromagnetic radiation. We discussed the general course of radiation, potential side effects, and toxicities with radiation. The patient is interested in breast conservation therapy but if she is unable to tolerate  radiation treatments we discussed the potential risk for recurrence with just lumpectomy alone in her situation.   PLAN:  1. Genetics 2. MRI 3. Port 4. Neo vs Adjuvant 5. Lumpectomy/SLN 6. XRT 7. Echo 8. Chemo class   ------------------------------------------------  Blair Promise, PhD, MD This document serves as a record of services personally performed by Gery Pray, MD. It was created on his behalf by Mary-Margaret Loma Messing, a trained medical scribe. The creation of this record is based on the scribe's personal observations and the provider's statements to them. This document has been checked and approved by the attending provider.

## 2018-03-21 NOTE — Therapy (Signed)
[place], [place], [address] Phone: [phone]   Fax:  [phone]  Physical Therapy Evaluation  Patient Details  Name: Kristina Rivera MRN: [id number] Date of Birth: 1960-11-25 Referring Provider (PT): Dr. Stark Klein   Encounter Date: 03/21/2018  PT End of Session - 03/21/18 1555    Visit Number  1    Number of Visits  2    Date for PT Re-Evaluation  05/16/18    PT Start Time  7416    PT Stop Time  1536    PT Time Calculation (min)  25 min    Activity Tolerance  Patient tolerated treatment well    Behavior During Therapy  [facility] for tasks assessed/performed       Past Medical History:  Diagnosis Date  . Anxiety   . Arthritis    "back; knees; shoulders" (04/19/2013)  . Asthma   . Chronic lower back pain   . Daily headache    "last couple months" (04/19/2013)  . Depression     Past Surgical History:  Procedure Laterality Date  . CARPAL TUNNEL RELEASE Left 1999?  . TUBAL LIGATION  1993    There were no vitals filed for this visit.   Subjective Assessment - 03/21/18 1541    Subjective  Patient reports she is here today to be seen by her medical team for her newly diagnosed right breast cancer.    Patient is accompained by:  Family member    Pertinent History  Patient was diagnosed on 02/23/18 with right triple negative grade III invasive ductal carcinoma breast cancer. Her mass measures 3 cm and is located in the lower outer quadrant with a Ki67 of 80%.    Patient Stated Goals  Reduce lymphedema risk and learn post op shoulder ROM HEP    Currently in Pain?  No/denies         [facility] PT Assessment - 03/21/18 0001      Assessment   Medical Diagnosis  Right breast cancer    Referring Provider (PT)  Dr. Stark Klein    Onset Date/Surgical Date  02/23/18    Hand Dominance  Right    Prior Therapy  none      Precautions   Precautions  Other (comment)    Precaution Comments  active cancer;  has electromagnetic field sensitivity      Restrictions   Weight Bearing Restrictions  No      Balance Screen   Has the patient fallen in the past 6 months  Yes    How many times?  2   Faints due to electromagnetic field sensitivity   Has the patient had a decrease in activity level because of a fear of falling?   Yes    Is the patient reluctant to leave their home because of a fear of falling?   Yes   this is not something PT can address due to cause of falls     [place] residence    Living Arrangements  Spouse/significant other;Children   Husband and 75 y.o. son   Available Help at Discharge  Family      Prior Function   Level of [place]  On disability    Leisure  She does not exercise      Cognition   Overall Cognitive Status  Within Functional Limits for tasks assessed      Posture/Postural Control  Posture/Postural Control  Postural limitations    Postural Limitations  Forward head;Rounded Shoulders      ROM / Strength   AROM / PROM / Strength  AROM;Strength      AROM   AROM Assessment Site  Shoulder;Cervical    Right/Left Shoulder  Right;Left    Right Shoulder Extension  52 Degrees    Right Shoulder Flexion  113 Degrees   Limited by pain   Right Shoulder ABduction  128 Degrees    Right Shoulder Internal Rotation  0 Degrees   Limited by pain   Right Shoulder External Rotation  65 Degrees    Left Shoulder Extension  43 Degrees    Left Shoulder Flexion  117 Degrees    Left Shoulder ABduction  130 Degrees    Left Shoulder Internal Rotation  58 Degrees    Left Shoulder External Rotation  40 Degrees      Strength   Overall Strength  Within functional limits for tasks performed        LYMPHEDEMA/ONCOLOGY QUESTIONNAIRE - 03/21/18 1553      Type   Cancer Type  Right breast      Lymphedema Assessments   Lymphedema Assessments  Upper extremities      Right Upper Extremity Lymphedema   10  cm Proximal to Olecranon Process  41.9 cm    Olecranon Process  31.5 cm    10 cm Proximal to Ulnar Styloid Process  28.5 cm    Just Proximal to Ulnar Styloid Process  17.9 cm    Across Hand at [employer]  20.1 cm    At [place] of 2nd Digit  6.7 cm      Left Upper Extremity Lymphedema   10 cm Proximal to Olecranon Process  42.3 cm    Olecranon Process  30.7 cm    10 cm Proximal to Ulnar Styloid Process  28.4 cm    Just Proximal to Ulnar Styloid Process  18.1 cm    Across Hand at [employer]  20.7 cm    At [place] of 2nd Digit  6.4 cm             Objective measurements completed on examination: See above findings.         Patient was instructed today in a home exercise program today for post op shoulder range of motion. These included active assist shoulder flexion in sitting, scapular retraction, wall walking with shoulder abduction, and hands behind head external rotation.  She was encouraged to do these twice a day, holding 3 seconds and repeating 5 times when permitted by her physician.         PT Education - 03/21/18 1554    Education Details  Lymphedema risk reduction and post op shoulder ROM HEP    Person(s) Educated  Patient;Spouse    Methods  Explanation;Demonstration;Handout    Comprehension  Returned demonstration          PT Long Term Goals - 03/21/18 1605      PT LONG TERM GOAL #1   Title  Patient will demonstrate she has regained full shoulder ROM and function s/p breast surgery when compared to baseline measurements.    Time  [facility] Goals - 03/21/18 1605      Patient will be able to verbalize understanding of pertinent lymphedema risk reduction practices relevant to her diagnosis specifically related  to skin care.   Time  1    Period  Days    Status  Achieved      Patient will be able to return demonstrate and/or verbalize understanding of the post-op home exercise program related to  regaining shoulder range of motion.   Time  1    Period  Days    Status  Achieved      Patient will be able to verbalize understanding of the importance of attending the postoperative After Breast Cancer Class for further lymphedema risk reduction education and therapeutic exercise.   Time  1    Period  Days    Status  Achieved            Plan - 03/21/18 1556    Clinical Impression Statement  Patient was diagnosed on 02/23/18 with right triple negative grade III invasive ductal carcinoma breast cancer. Her mass measures 3 cm and is located in the lower outer quadrant with a Ki67 of 80%. She also has EMF - electromagnetic field sensitivity which she stated causes frequent fainting, heart palpitations, etc. She became dizzy and her breathing increased while PT was measuring shoulder ROM. She had to sit in a chair and do deep breathing before we could continue. Her multidisciplinary medical team met prior to her assessments to determine a recommended treatment plan. She is planning to have a right lumpectomy and sentinel node biopsy followed by chemotherapy and radiation. She will benefit from a post op PT visit to reassess and determine needs.    History and Personal Factors relevant to plan of care:  Has EMF - electromagnetic field sensistivity which is very debilitating to her. She frequently faints and has panic attacks. Needs MRI to determine extent of disease.    Clinical Presentation  Evolving    Clinical Presentation due to:  Unknown extent of disease; EMF    Clinical Decision Making  Moderate    Rehab Potential  Good    Clinical Impairments Affecting Rehab Potential  Frequent fainting    PT Frequency  One time visit    PT Treatment/Interventions  ADLs/Self Care Home Management;Patient/family education;Therapeutic exercise    PT Next Visit Plan  Will reassess 3-4 weeks post op to determine needs    PT Home Exercise Plan  Post op shoulder ROM HEP and lymphedema risk reduction     Consulted and Agree with Plan of Care  Patient;Family member/caregiver    Family Member Consulted  Husband       Patient will benefit from skilled therapeutic intervention in order to improve the following deficits and impairments:  Decreased range of motion, Impaired UE functional use, Pain, Decreased knowledge of precautions, Postural dysfunction  Visit Diagnosis: Malignant neoplasm of lower-outer quadrant of right breast of female, estrogen receptor negative ([place]) - Plan: PT plan of care cert/re-cert  Abnormal posture - Plan: PT plan of care cert/re-cert  Chronic right shoulder pain - Plan: PT plan of care cert/re-cert  Stiffness of right shoulder, not elsewhere classified - Plan: PT plan of care cert/re-cert   Patient will follow up at outpatient cancer rehab 3-4 weeks following surgery.  If the patient requires physical therapy at that time, a specific plan will be dictated and sent to the referring physician for approval. The patient was educated today on appropriate basic range of motion exercises to begin post operatively and the importance of attending the After Breast Cancer class following surgery.  Patient was educated today on lymphedema risk reduction practices as  it pertains to recommendations that will benefit the patient immediately following surgery.  She verbalized good understanding.      Problem List Patient Active Problem List   Diagnosis Date Noted  . Morbid obesity with BMI of 45.0-49.9, adult ([place]) 03/21/2018  . Malignant neoplasm of lower-outer quadrant of right breast of female, estrogen receptor negative ([place]) 03/15/2018  . Chest pain 04/19/2013  . Post-menopausal bleeding 04/30/2012  . Back pain 03/26/2012  . Depression 03/09/2012  . Right knee pain 02/10/2011  . PANIC ATTACK 06/22/2009  . RHINITIS, ALLERGIC 05/11/2006  . ASTHMA, INTERMITTENT 05/11/2006   Annia Friendly, PT 03/21/18 4:08 PM  [place], [place], [address] Phone: [phone]   Fax:  [phone]  Name: Kristina Rivera MRN: [id number] Date of Birth: 01/29/1961

## 2018-03-21 NOTE — Progress Notes (Signed)

## 2018-03-21 NOTE — Progress Notes (Signed)
Kristina Rivera  [phone] [phone]     ID: Kristina Rivera DOB: [birth year]  MR#: [id number]  DXA#:[id number]  Patient Care Team: Jonathon Jordan, MD as PCP - General (Family Medicine) Kalla Watson, [name], MD as Consulting Physician (Oncology) Stark Klein, MD as Consulting Physician (General Surgery) Afreen Siebels, [name], MD as Consulting Physician (Oncology) Gery Pray, MD as Consulting Physician (Radiation Oncology) Chauncey Cruel, MD OTHER MD:  CHIEF COMPLAINT: Triple negative breast cancer  CURRENT TREATMENT: Neoadjuvant chemotherapy   HISTORY OF CURRENT ILLNESS: Kristina Rivera had palpated a lump in her left breast. She then underwent mammography on 02/23/2018 at the [facility] showing a possible abnormality in both the right and left breasts. She underwent bilateral diagnostic mammography with tomography and bilateral breast ultrasonography on 03/01/2018 showing: 3 cm mass in the 8 o'clock position of the right breast highly suspicious for breast malignancy; Two borderline abnormal right axillary lymph nodes with cortical thicknesses of 4 mm; indeterminate mass or lesion in the 2 o'clock retroareolar region of the left breast measuring 6 mm and no left axillary adenopathy.   Accordingly on 03/09/2018 she proceeded to biopsy of the breast areas in question. The pathology from this procedure ([id number]) showed: high grade carcinoma with necrosis in the right breast at 8 o'clock; benign right axilla lymph node; benign breast parenchyma in the left breast at 2 o'clock retroareolar. Prognostic indicators significant for: estrogen receptor, 0% negative and progesterone receptor, 0% negative. Proliferation marker Ki67 at 80%. HER2 equivocal (2+) by immunohistochemistry, but negative by fluorescent in situ hybridization with an initial analysis result with a signals ratio 1.22 and number per cell 4.4. A repeat analysis was completed showing a signals  ratio 1.13 and number per cell 4.43.   She underwent genetic screening on 03/20/2018 with results pending.  The patient's subsequent history is as detailed below.   INTERVAL HISTORY: [name] was evaluated in the breast cancer clinic on 03/21/2018 accompanied by her husband. Her case was also presented at the multidisciplinary breast cancer conference on the same day. At that time a preliminary plan was proposed: Neoadjuvant chemotherapy, definitive surgery, genetics, adjuvant radiation  REVIEW OF SYSTEMS: Kristina Rivera notes she is sensitive to electromagnetic fields.  When exposed to electromagnetic fields for example a phone or a computer she experiences blurred vision, painful or difficulty swallowing, palpitations, chest pain, and can pass out.  Otherwise she reports intermittent pain to both breasts and pelvic area., abdominal pain, breast lump, back and joint pain with arthritis, headaches, weakness, fainting, numbness, forgetfulness, anxiety, depression, and anemia. A detailed review of systems was otherwise entirely negative.   PAST MEDICAL HISTORY: Past Medical History:  Diagnosis Date  . Anxiety   . Arthritis    "back; knees; shoulders" (04/19/2013)  . Asthma   . Chronic lower back pain   . Daily headache    "last couple months" (04/19/2013)  . Depression     PAST SURGICAL HISTORY: Past Surgical History:  Procedure Laterality Date  . CARPAL TUNNEL RELEASE Left 1999?  . TUBAL LIGATION  1993    FAMILY HISTORY Family History  Problem Relation Age of Onset  . Breast cancer Maternal Aunt 70  . Pneumonia Mother   . Lung cancer Mother 59  . Stroke Father   . Head & neck cancer Sister 30       neck  . Prostate cancer Neg Hx    She underwent commercial genetic testing and was told she is 25% of European  Jewish ancestry. Patient father was 59 years old when he died from stroke. Patient mother died from pneumonia at age 69.  The patient notes a family hx of breast cancer in a maternal  aunt diagnosed with breast cancer at age 11.  The patient has 9 siblings, 4 brothers and 5 sisters.  1 sister was diagnosed with neck cancer at age 50, and patient's mom with lung cancer at age 3.  GYNECOLOGIC HISTORY:  No LMP recorded. Patient is postmenopausal. Menarche: 58 years old Age at first live birth: 59 years old [place] P 5 LMP around age 61 Contraceptive: never HRT: no  Hysterectomy? no So? no   SOCIAL HISTORY: She is on disability due to a back injury. Prior to that, she was a Quarry manager at [employer]. Her husband, Mariea Clonts, is retired. He was a Animal nutritionist. They have 5 children. Pascal Lux, age 51, is a Community education officer in [place], [place]. Orvis Brill, age 57, is a Education officer, museum here in [place]. Wendy Poet, age 65, is a school Engineer, water in [place], [place]. Silverio Decamp., age 75, is a Customer service manager here in [place]. [place], age 92, is a nanny in [place], [place]. She and her husband have two grandchildren, and her husband has 7 grandchildren and 7 great-grandchildren from 2 children from a prior marriage. They attend the [employer].  They tell me their religious affiliation does not involve any medical restrictions     ADVANCED DIRECTIVES: The patient and her husband are each others healthcare powers of attorney   HEALTH MAINTENANCE: Social History   Tobacco Use  . Smoking status: Never Smoker  . Smokeless tobacco: Never Used  Substance Use Topics  . Alcohol use: No  . Drug use: No     Colonoscopy: 2010, Dr. Vella Kohler?  PAP: 07/2016  Bone density: more than 3 years ago   Allergies  Allergen Reactions  . Nsaids Anaphylaxis  . Aspirin     REACTION: Hives, Anaphylaxis    Current Outpatient Medications  Medication Sig Dispense Refill  . acetaminophen (TYLENOL) 500 MG tablet Take 1,000 mg by mouth every 8 (eight) hours.      Marland Kitchen albuterol (VENTOLIN HFA) 108 (90 BASE) MCG/ACT  inhaler Inhale 2 puffs into the lungs as needed. 1 Inhaler 6   No current facility-administered medications for this visit.     OBJECTIVE: Morbidly obese African-American woman keeping a fabric around her head to protect from electromagnetic radiation  Vitals:   03/21/18 1248  BP: (!) 149/79  Pulse: 89  Resp: 18  Temp: 99 F (37.2 C)  SpO2: 95%     Body mass index is 56.3 kg/m.   Wt Readings from Last 3 Encounters:  03/21/18 (!) 307 lb 12.8 oz (139.6 kg)  04/20/13 293 lb 1.6 oz (132.9 kg)  11/23/12 294 lb 12.8 oz (133.7 kg)      ECOG FS:1 - Symptomatic but completely ambulatory  Ocular: Sclerae unicteric, pupils round and equal Ear-nose-throat: Oropharynx clear and moist Lymphatic: No cervical or supraclavicular adenopathy Lungs no rales or rhonchi Heart regular rate and rhythm Abd soft, nontender, positive bowel sounds MSK no focal spinal tenderness, no joint edema Neuro: non-focal, well-oriented, appropriate affect Breasts: I do not palpate a mass in the right breast.  There is no ecchymosis from the recent biopsy.  The left breast is unremarkable.  Both axillae are benign   LAB RESULTS:  CMP     Component  Value Date/Time   NA 140 03/21/2018 1235   K 4.4 03/21/2018 1235   CL 102 03/21/2018 1235   CO2 30 03/21/2018 1235   GLUCOSE 138 (H) 03/21/2018 1235   BUN 10 03/21/2018 1235   CREATININE 0.93 03/21/2018 1235   CREATININE 0.72 11/23/2012 1616   CALCIUM 9.6 03/21/2018 1235   PROT 8.2 (H) 03/21/2018 1235   ALBUMIN 3.7 03/21/2018 1235   AST 24 03/21/2018 1235   ALT 21 03/21/2018 1235   ALKPHOS 76 03/21/2018 1235   BILITOT 0.6 03/21/2018 1235   GFRNONAA >60 03/21/2018 1235   GFRAA >60 03/21/2018 1235    No results found for: TOTALPROTELP, ALBUMINELP, A1GS, A2GS, BETS, BETA2SER, GAMS, MSPIKE, SPEI  No results found for: KPAFRELGTCHN, LAMBDASER, KAPLAMBRATIO  Lab Results  Component Value Date   WBC 7.1 03/21/2018   NEUTROABS 3.4 03/21/2018   HGB 12.8  03/21/2018   HCT 43.1 03/21/2018   MCV 73.7 (L) 03/21/2018   PLT 310 03/21/2018    '@LASTCHEMISTRY'$ @  No results found for: LABCA2  No components found for: RCVELF810  No results for input(s): INR in the last 168 hours.  No results found for: LABCA2  No results found for: FBP102  No results found for: HEN277  No results found for: OEU235  No results found for: CA2729  No components found for: HGQUANT  No results found for: CEA1 / No results found for: CEA1   No results found for: AFPTUMOR  No results found for: CHROMOGRNA  No results found for: PSA1  Appointment on 03/21/2018  Component Date Value Ref Range Status  . Sodium 03/21/2018 140  135 - 145 mmol/L Final  . Potassium 03/21/2018 4.4  3.5 - 5.1 mmol/L Final  . Chloride 03/21/2018 102  98 - 111 mmol/L Final  . CO2 03/21/2018 30  22 - 32 mmol/L Final  . Glucose, Bld 03/21/2018 138* 70 - 99 mg/dL Final  . BUN 03/21/2018 10  6 - 20 mg/dL Final  . Creatinine 03/21/2018 0.93  0.44 - 1.00 mg/dL Final  . Calcium 03/21/2018 9.6  8.9 - 10.3 mg/dL Final  . Total Protein 03/21/2018 8.2* 6.5 - 8.1 g/dL Final  . Albumin 03/21/2018 3.7  3.5 - 5.0 g/dL Final  . AST 03/21/2018 24  15 - 41 U/L Final  . ALT 03/21/2018 21  0 - 44 U/L Final  . Alkaline Phosphatase 03/21/2018 76  38 - 126 U/L Final  . Total Bilirubin 03/21/2018 0.6  0.3 - 1.2 mg/dL Final  . GFR, Est Non Af Am 03/21/2018 >60  >60 mL/min Final  . GFR, Est AFR Am 03/21/2018 >60  >60 mL/min Final  . Anion gap 03/21/2018 8  5 - 15 Final   Performed at [facility], [place] [address], [place], [place] [address]  . WBC Count 03/21/2018 7.1  4.0 - 10.5 K/uL Final  . RBC 03/21/2018 5.85* 3.87 - 5.11 MIL/uL Final  . Hemoglobin 03/21/2018 12.8  12.0 - 15.0 g/dL Final  . HCT 03/21/2018 43.1  36.0 - 46.0 % Final  . MCV 03/21/2018 73.7* 80.0 - 100.0 fL Final  . MCH 03/21/2018 21.9* 26.0 - 34.0 pg Final  . MCHC 03/21/2018 29.7* 30.0 - 36.0 g/dL Final    . RDW 03/21/2018 15.4  11.5 - 15.5 % Final  . Platelet Count 03/21/2018 310  150 - 400 K/uL Final  . nRBC 03/21/2018 0.0  0.0 - 0.2 % Final  . Neutrophils Relative % 03/21/2018 50  %  Final  . Neutro Abs 03/21/2018 3.4  1.7 - 7.7 K/uL Final  . Lymphocytes Relative 03/21/2018 39  % Final  . Lymphs Abs 03/21/2018 2.8  0.7 - 4.0 K/uL Final  . Monocytes Relative 03/21/2018 8  % Final  . Monocytes Absolute 03/21/2018 0.6  0.1 - 1.0 K/uL Final  . Eosinophils Relative 03/21/2018 3  % Final  . Eosinophils Absolute 03/21/2018 0.2  0.0 - 0.5 K/uL Final  . Basophils Relative 03/21/2018 0  % Final  . Basophils Absolute 03/21/2018 0.0  0.0 - 0.1 K/uL Final  . Immature Granulocytes 03/21/2018 0  % Final  . Abs Immature Granulocytes 03/21/2018 0.03  0.00 - 0.07 K/uL Final   Performed at [facility], [place] [address], [place], [place] [address]    (this displays the last labs from the last 3 days)  No results found for: TOTALPROTELP, ALBUMINELP, A1GS, A2GS, BETS, BETA2SER, GAMS, MSPIKE, SPEI (this displays SPEP labs)  No results found for: KPAFRELGTCHN, LAMBDASER, KAPLAMBRATIO (kappa/lambda light chains)  No results found for: HGBA, HGBA2QUANT, HGBFQUANT, HGBSQUAN (Hemoglobinopathy evaluation)   No results found for: LDH  No results found for: IRON, TIBC, IRONPCTSAT (Iron and TIBC)  No results found for: FERRITIN  Urinalysis No results found for: COLORURINE, APPEARANCEUR, LABSPEC, PHURINE, GLUCOSEU, HGBUR, BILIRUBINUR, KETONESUR, PROTEINUR, UROBILINOGEN, NITRITE, LEUKOCYTESUR   STUDIES: US Breast Ltd Uni Left Inc Axilla  Result Date: 03/01/2018 CLINICAL DATA:  Screening recall for possible masses in each breast. EXAM: DIGITAL DIAGNOSTIC BILATERAL MAMMOGRAM WITH CAD AND TOMO ULTRASOUND BILATERAL BREAST COMPARISON:  Previous exam(s). ACR Breast Density Category c: The breast tissue is heterogeneously dense, which may obscure small masses. FINDINGS: In the right  breast, the possible mass persists. It has partly lobulated and partly indistinct margins. It lies in the lower outer quadrant of the right breast. No other right breast masses. No architectural distortion. No suspicious calcifications. In the left breast, the possible mass persists as an oval mostly circumscribed retroareolar mass. No other left breast masses, no architectural distortion and no suspicious calcifications. Mammographic images were processed with CAD. On physical exam, there is a palpable, firm, somewhat tender lump along the 8 o'clock position of the right breast. No palpable retroareolar left breast masses. Targeted right breast ultrasound is performed, showing a hypoechoic mass with irregular and partly ill-defined margins in the right breast at 8 o'clock, 7 cm the nipple, measuring 3.0 x 1.6 x 2.4 cm. It has internal vascularity on color Doppler analysis. Sonographic evaluation of the right axilla shows several mildly prominent lymph nodes, 2 with cortical thickness is of 4 mm. All nodes have normal overall shapes with hilar fat. Targeted left breast ultrasound is performed, showing an oval circumscribed cyst in the 1 o'clock position of the left breast, 2 cm the nipple, measuring 6 x 3 x 4 mm. This is consistent in size, shape and location to the mammographic finding. Also on the left in the retroareolar region is an irregular focal hypoechogenic area with posterior acoustic shadowing. This lies at 2 o'clock, 1 cm from the nipple measuring approximately 6 x 4 x 5 mm. Sonographic evaluation of the left axilla shows no enlarged or abnormal lymph nodes. IMPRESSION: 1. 3 cm mass in the 8 o'clock position of the right breast highly suspicious for breast malignancy. 2. Two borderline abnormal right axillary lymph nodes with cortical thicknesses of 4 mm. 3. Indeterminate mass or lesion in the 2 o'clock retroareolar region of the left breast measuring 6 mm.  No left axillary adenopathy. RECOMMENDATION: 1.  Ultrasound-guided core needle biopsy of the 8 o'clock position, 3 cm right breast mass. 2. Ultrasound-guided core needle biopsy of 1 of the 2 borderline abnormal right axillary lymph nodes. 3. Ultrasound-guided core needle biopsy of the 6 mm, 2 o'clock position retroareolar left breast lesion. I have discussed the findings and recommendations with the patient. Results were also provided in writing at the conclusion of the visit. If applicable, a reminder letter will be sent to the patient regarding the next appointment. BI-RADS CATEGORY  5: Highly suggestive of malignancy. Electronically Signed   By: Lajean Manes M.D.   On: 03/01/2018 09:43   US Breast Ltd Uni Right Inc Axilla  Result Date: 03/01/2018 CLINICAL DATA:  Screening recall for possible masses in each breast. EXAM: DIGITAL DIAGNOSTIC BILATERAL MAMMOGRAM WITH CAD AND TOMO ULTRASOUND BILATERAL BREAST COMPARISON:  Previous exam(s). ACR Breast Density Category c: The breast tissue is heterogeneously dense, which may obscure small masses. FINDINGS: In the right breast, the possible mass persists. It has partly lobulated and partly indistinct margins. It lies in the lower outer quadrant of the right breast. No other right breast masses. No architectural distortion. No suspicious calcifications. In the left breast, the possible mass persists as an oval mostly circumscribed retroareolar mass. No other left breast masses, no architectural distortion and no suspicious calcifications. Mammographic images were processed with CAD. On physical exam, there is a palpable, firm, somewhat tender lump along the 8 o'clock position of the right breast. No palpable retroareolar left breast masses. Targeted right breast ultrasound is performed, showing a hypoechoic mass with irregular and partly ill-defined margins in the right breast at 8 o'clock, 7 cm the nipple, measuring 3.0 x 1.6 x 2.4 cm. It has internal vascularity on color Doppler analysis. Sonographic evaluation  of the right axilla shows several mildly prominent lymph nodes, 2 with cortical thickness is of 4 mm. All nodes have normal overall shapes with hilar fat. Targeted left breast ultrasound is performed, showing an oval circumscribed cyst in the 1 o'clock position of the left breast, 2 cm the nipple, measuring 6 x 3 x 4 mm. This is consistent in size, shape and location to the mammographic finding. Also on the left in the retroareolar region is an irregular focal hypoechogenic area with posterior acoustic shadowing. This lies at 2 o'clock, 1 cm from the nipple measuring approximately 6 x 4 x 5 mm. Sonographic evaluation of the left axilla shows no enlarged or abnormal lymph nodes. IMPRESSION: 1. 3 cm mass in the 8 o'clock position of the right breast highly suspicious for breast malignancy. 2. Two borderline abnormal right axillary lymph nodes with cortical thicknesses of 4 mm. 3. Indeterminate mass or lesion in the 2 o'clock retroareolar region of the left breast measuring 6 mm. No left axillary adenopathy. RECOMMENDATION: 1. Ultrasound-guided core needle biopsy of the 8 o'clock position, 3 cm right breast mass. 2. Ultrasound-guided core needle biopsy of 1 of the 2 borderline abnormal right axillary lymph nodes. 3. Ultrasound-guided core needle biopsy of the 6 mm, 2 o'clock position retroareolar left breast lesion. I have discussed the findings and recommendations with the patient. Results were also provided in writing at the conclusion of the visit. If applicable, a reminder letter will be sent to the patient regarding the next appointment. BI-RADS CATEGORY  5: Highly suggestive of malignancy. Electronically Signed   By: Lajean Manes M.D.   On: 03/01/2018 09:43   Mm Diag Breast Tomo Bilateral  Result Date: 03/01/2018 CLINICAL DATA:  Screening recall for possible masses in each breast. EXAM: DIGITAL DIAGNOSTIC BILATERAL MAMMOGRAM WITH CAD AND TOMO ULTRASOUND BILATERAL BREAST COMPARISON:  Previous exam(s). ACR  Breast Density Category c: The breast tissue is heterogeneously dense, which may obscure small masses. FINDINGS: In the right breast, the possible mass persists. It has partly lobulated and partly indistinct margins. It lies in the lower outer quadrant of the right breast. No other right breast masses. No architectural distortion. No suspicious calcifications. In the left breast, the possible mass persists as an oval mostly circumscribed retroareolar mass. No other left breast masses, no architectural distortion and no suspicious calcifications. Mammographic images were processed with CAD. On physical exam, there is a palpable, firm, somewhat tender lump along the 8 o'clock position of the right breast. No palpable retroareolar left breast masses. Targeted right breast ultrasound is performed, showing a hypoechoic mass with irregular and partly ill-defined margins in the right breast at 8 o'clock, 7 cm the nipple, measuring 3.0 x 1.6 x 2.4 cm. It has internal vascularity on color Doppler analysis. Sonographic evaluation of the right axilla shows several mildly prominent lymph nodes, 2 with cortical thickness is of 4 mm. All nodes have normal overall shapes with hilar fat. Targeted left breast ultrasound is performed, showing an oval circumscribed cyst in the 1 o'clock position of the left breast, 2 cm the nipple, measuring 6 x 3 x 4 mm. This is consistent in size, shape and location to the mammographic finding. Also on the left in the retroareolar region is an irregular focal hypoechogenic area with posterior acoustic shadowing. This lies at 2 o'clock, 1 cm from the nipple measuring approximately 6 x 4 x 5 mm. Sonographic evaluation of the left axilla shows no enlarged or abnormal lymph nodes. IMPRESSION: 1. 3 cm mass in the 8 o'clock position of the right breast highly suspicious for breast malignancy. 2. Two borderline abnormal right axillary lymph nodes with cortical thicknesses of 4 mm. 3. Indeterminate mass or  lesion in the 2 o'clock retroareolar region of the left breast measuring 6 mm. No left axillary adenopathy. RECOMMENDATION: 1. Ultrasound-guided core needle biopsy of the 8 o'clock position, 3 cm right breast mass. 2. Ultrasound-guided core needle biopsy of 1 of the 2 borderline abnormal right axillary lymph nodes. 3. Ultrasound-guided core needle biopsy of the 6 mm, 2 o'clock position retroareolar left breast lesion. I have discussed the findings and recommendations with the patient. Results were also provided in writing at the conclusion of the visit. If applicable, a reminder letter will be sent to the patient regarding the next appointment. BI-RADS CATEGORY  5: Highly suggestive of malignancy. Electronically Signed   By: Lajean Manes M.D.   On: 03/01/2018 09:43   Mm 3d Screen Breast Bilateral  Result Date: 02/26/2018 CLINICAL DATA:  Screening. EXAM: DIGITAL SCREENING BILATERAL MAMMOGRAM WITH TOMO AND CAD COMPARISON:  Previous exam(s). ACR Breast Density Category c: The breast tissue is heterogeneously dense, which may obscure small masses. FINDINGS: In the right breast mass requires further evaluation. In the left breast mass requires further evaluation. Images were processed with CAD. IMPRESSION: Further evaluation is suggested for possible mass in the right breast. Further evaluation is suggested for possible mass in the left breast. RECOMMENDATION: Diagnostic mammogram and possibly ultrasound of both breasts. (Code:[id number]-B-57M) The patient will be contacted regarding the findings, and additional imaging will be scheduled. BI-RADS CATEGORY  0: Incomplete. Need additional imaging evaluation and/or prior mammograms for comparison. Electronically Signed  By: Lovey Newcomer M.D.   On: 02/26/2018 10:15   [place] Axillary Node Core Biopsy Right  Addendum Date: 03/15/2018   ADDENDUM REPORT: 03/15/2018 09:38 ADDENDUM: Pathology revealed HIGH GRADE CARCINOMA WITH NECROSIS of the RIGHT breast, 8 o'clock. This  immunoprofile, though not diagnostic, is compatible with a primary breast ductal carcinoma in the absence of any other primary lesions. This was found to be concordant by Dr. Claudie Revering. Pathology revealed BENIGN LYMPH NODE of RIGHT axilla. This was found to be concordant by Dr. Claudie Revering. Pathology revealed BENIGN BREAST PARENCHYMA WITH FIBROCYSTIC CHANGE AND USUAL DUCTAL HYPERPLASIA of the LEFT breast, 2 o'clock retroareolar. This was found to be concordant by Dr. Claudie Revering. Pathology results were discussed with the patient by telephone. The patient reported doing well after the biopsies with tenderness at the sites. Post biopsy instructions and care were reviewed and questions were answered. The patient was encouraged to call [facility] for any additional concerns. The patient was referred to [facility] on March 21, 2018. Recommendation consideration of a bilateral breast MRI and/or PET-CT for further evaluation since the pathology results are not definitive for a primary breast carcinoma. Pathology results reported by Terie Purser, RN on 03/15/2018. Electronically Signed   By: Claudie Revering M.D.   On: 03/15/2018 09:38   Result Date: 03/15/2018 CLINICAL DATA:  3 cm mass in the 8 o'clock position of the right breast with imaging features highly suspicious for malignancy. Two borderline abnormal right axillary lymph nodes with mild cortical thickening. 6 mm indeterminate mass in the 2 o'clock retroareolar left breast. EXAM: ULTRASOUND GUIDED BILATERAL BREAST CORE NEEDLE BIOPSY X 3 COMPARISON:  Previous exam(s). FINDINGS: I met with the patient and we discussed the procedure of ultrasound-guided biopsy, including benefits and alternatives. We discussed the high likelihood of a successful procedure. We discussed the risks of the procedure, including infection, bleeding, tissue injury, clip migration, and  inadequate sampling. Informed written consent was given. The usual time-out protocol was performed immediately prior to the procedure. SITE #1: 8 O'CLOCK RIGHT BREAST Lesion quadrant: Lower outer quadrant Using sterile technique and 1% Lidocaine as local anesthetic, under direct ultrasound visualization, a 12 gauge spring-loaded device was used to perform biopsy of the recently demonstrated 3.0 cm mass in the 8 o'clock position of the right breast, 7 cm from the nipple, using a caudal approach. Preliminary ultrasound of that area demonstrated multiple cystic-appearing areas within the 3.0 cm mass. At the conclusion of the procedure a ribbon shaped tissue marker clip was deployed into the biopsy cavity. Follow up 2 view mammogram was performed and dictated separately. SITE #2: RIGHT AXILLA Using sterile technique and 1% Lidocaine as local anesthetic, under direct ultrasound visualization, a 14 gauge spring-loaded device was used to perform biopsy of 1 of the previously demonstrated 2 right axillary lymph nodes demonstrating mild cortical thickening using a caudal approach. At the conclusion of the procedure a spiral shaped HydroMARK tissue marker clip was deployed into the biopsy cavity. Follow up 2 view mammogram was performed and dictated separately. SITE #3: 2 O'CLOCK RETROAREOLAR LEFT BREAST Lesion quadrant: Upper outer quadrant Using sterile technique and 1% Lidocaine as local anesthetic, under direct ultrasound visualization, a 12 gauge spring-loaded device was used to perform biopsy of the recently demonstrated 6 mm mass in the 2 o'clock retroareolar left breast using a inferolateral approach. At the conclusion of the procedure a coil shaped  tissue marker clip was deployed into the biopsy cavity. Follow up 2 view mammogram was performed and dictated separately. IMPRESSION: Ultrasound guided biopsy of a 3.0 cm 8 o'clock right breast mass, right axillary lymph node with mild cortical thickening and 6 mm 2  o'clock retroareolar left breast mass. No apparent complications. Electronically Signed: By: Claudie Revering M.D. On: 03/09/2018 14:37   Mm Clip Placement Left  Result Date: 03/09/2018 CLINICAL DATA:  Status post ultrasound-guided core needle biopsy of a 3 cm mass in the 8 o'clock position of the right breast, right axillary lymph node with mild cortical thickening and 6 mm mass in the 2 o'clock retroareolar left breast. EXAM: DIAGNOSTIC BILATERAL MAMMOGRAM POST ULTRASOUND BIOPSY X 3 COMPARISON:  Previous exam(s). FINDINGS: Mammographic images were obtained following ultrasound guided biopsy of a 3 cm mass in the 8 o'clock position of the right breast, right axillary lymph node and 6 mm mass in the 2 o'clock retroareolar left breast. These demonstrate a ribbon shaped biopsy marker clip within the biopsied mass in the 8 o'clock position of the right breast and a spiral shaped HydroMARK biopsy marker clip in the biopsied right axillary lymph node. A coil shaped biopsy marker clip is at the expected location of the biopsied mass in the 2 o'clock retroareolar left breast. IMPRESSION: Appropriate clip deployment following right breast, right axillary and left breast ultrasound-guided core needle biopsies. Final Assessment: Post Procedure Mammograms for Marker Placement Electronically Signed   By: Claudie Revering M.D.   On: 03/09/2018 16:49   Mm Clip Placement Right  Result Date: 03/09/2018 CLINICAL DATA:  Status post ultrasound-guided core needle biopsy of a 3 cm mass in the 8 o'clock position of the right breast, right axillary lymph node with mild cortical thickening and 6 mm mass in the 2 o'clock retroareolar left breast. EXAM: DIAGNOSTIC BILATERAL MAMMOGRAM POST ULTRASOUND BIOPSY X 3 COMPARISON:  Previous exam(s). FINDINGS: Mammographic images were obtained following ultrasound guided biopsy of a 3 cm mass in the 8 o'clock position of the right breast, right axillary lymph node and 6 mm mass in the 2 o'clock  retroareolar left breast. These demonstrate a ribbon shaped biopsy marker clip within the biopsied mass in the 8 o'clock position of the right breast and a spiral shaped HydroMARK biopsy marker clip in the biopsied right axillary lymph node. A coil shaped biopsy marker clip is at the expected location of the biopsied mass in the 2 o'clock retroareolar left breast. IMPRESSION: Appropriate clip deployment following right breast, right axillary and left breast ultrasound-guided core needle biopsies. Final Assessment: Post Procedure Mammograms for Marker Placement Electronically Signed   By: Claudie Revering M.D.   On: 03/09/2018 16:49   [place] Lt Breast Bx W Loc Dev 1st Lesion Img Bx Spec US Guide  Addendum Date: 03/15/2018   ADDENDUM REPORT: 03/15/2018 09:38 ADDENDUM: Pathology revealed HIGH GRADE CARCINOMA WITH NECROSIS of the RIGHT breast, 8 o'clock. This immunoprofile, though not diagnostic, is compatible with a primary breast ductal carcinoma in the absence of any other primary lesions. This was found to be concordant by Dr. Claudie Revering. Pathology revealed BENIGN LYMPH NODE of RIGHT axilla. This was found to be concordant by Dr. Claudie Revering. Pathology revealed BENIGN BREAST PARENCHYMA WITH FIBROCYSTIC CHANGE AND USUAL DUCTAL HYPERPLASIA of the LEFT breast, 2 o'clock retroareolar. This was found to be concordant by Dr. Claudie Revering. Pathology results were discussed with the patient by telephone. The patient reported doing well after the biopsies with tenderness at  the sites. Post biopsy instructions and care were reviewed and questions were answered. The patient was encouraged to call [facility] for any additional concerns. The patient was referred to [facility] on March 21, 2018. Recommendation consideration of a bilateral breast MRI and/or PET-CT for further evaluation since the pathology results are not  definitive for a primary breast carcinoma. Pathology results reported by Terie Purser, RN on 03/15/2018. Electronically Signed   By: Claudie Revering M.D.   On: 03/15/2018 09:38   Result Date: 03/15/2018 CLINICAL DATA:  3 cm mass in the 8 o'clock position of the right breast with imaging features highly suspicious for malignancy. Two borderline abnormal right axillary lymph nodes with mild cortical thickening. 6 mm indeterminate mass in the 2 o'clock retroareolar left breast. EXAM: ULTRASOUND GUIDED BILATERAL BREAST CORE NEEDLE BIOPSY X 3 COMPARISON:  Previous exam(s). FINDINGS: I met with the patient and we discussed the procedure of ultrasound-guided biopsy, including benefits and alternatives. We discussed the high likelihood of a successful procedure. We discussed the risks of the procedure, including infection, bleeding, tissue injury, clip migration, and inadequate sampling. Informed written consent was given. The usual time-out protocol was performed immediately prior to the procedure. SITE #1: 8 O'CLOCK RIGHT BREAST Lesion quadrant: Lower outer quadrant Using sterile technique and 1% Lidocaine as local anesthetic, under direct ultrasound visualization, a 12 gauge spring-loaded device was used to perform biopsy of the recently demonstrated 3.0 cm mass in the 8 o'clock position of the right breast, 7 cm from the nipple, using a caudal approach. Preliminary ultrasound of that area demonstrated multiple cystic-appearing areas within the 3.0 cm mass. At the conclusion of the procedure a ribbon shaped tissue marker clip was deployed into the biopsy cavity. Follow up 2 view mammogram was performed and dictated separately. SITE #2: RIGHT AXILLA Using sterile technique and 1% Lidocaine as local anesthetic, under direct ultrasound visualization, a 14 gauge spring-loaded device was used to perform biopsy of 1 of the previously demonstrated 2 right axillary lymph nodes demonstrating mild cortical thickening using a caudal  approach. At the conclusion of the procedure a spiral shaped HydroMARK tissue marker clip was deployed into the biopsy cavity. Follow up 2 view mammogram was performed and dictated separately. SITE #3: 2 O'CLOCK RETROAREOLAR LEFT BREAST Lesion quadrant: Upper outer quadrant Using sterile technique and 1% Lidocaine as local anesthetic, under direct ultrasound visualization, a 12 gauge spring-loaded device was used to perform biopsy of the recently demonstrated 6 mm mass in the 2 o'clock retroareolar left breast using a inferolateral approach. At the conclusion of the procedure a coil shaped tissue marker clip was deployed into the biopsy cavity. Follow up 2 view mammogram was performed and dictated separately. IMPRESSION: Ultrasound guided biopsy of a 3.0 cm 8 o'clock right breast mass, right axillary lymph node with mild cortical thickening and 6 mm 2 o'clock retroareolar left breast mass. No apparent complications. Electronically Signed: By: Claudie Revering M.D. On: 03/09/2018 14:37   [place] Rt Breast Bx W Loc Dev 1st Lesion Img Bx Spec US Guide  Addendum Date: 03/15/2018   ADDENDUM REPORT: 03/15/2018 09:38 ADDENDUM: Pathology revealed HIGH GRADE CARCINOMA WITH NECROSIS of the RIGHT breast, 8 o'clock. This immunoprofile, though not diagnostic, is compatible with a primary breast ductal carcinoma in the absence of any other primary lesions. This was found to be concordant by Dr. Claudie Revering. Pathology revealed BENIGN LYMPH NODE of RIGHT  axilla. This was found to be concordant by Dr. Claudie Revering. Pathology revealed BENIGN BREAST PARENCHYMA WITH FIBROCYSTIC CHANGE AND USUAL DUCTAL HYPERPLASIA of the LEFT breast, 2 o'clock retroareolar. This was found to be concordant by Dr. Claudie Revering. Pathology results were discussed with the patient by telephone. The patient reported doing well after the biopsies with tenderness at the sites. Post biopsy instructions and care were reviewed and questions were answered. The patient was  encouraged to call [facility] for any additional concerns. The patient was referred to [facility] on March 21, 2018. Recommendation consideration of a bilateral breast MRI and/or PET-CT for further evaluation since the pathology results are not definitive for a primary breast carcinoma. Pathology results reported by Terie Purser, RN on 03/15/2018. Electronically Signed   By: Claudie Revering M.D.   On: 03/15/2018 09:38   Result Date: 03/15/2018 CLINICAL DATA:  3 cm mass in the 8 o'clock position of the right breast with imaging features highly suspicious for malignancy. Two borderline abnormal right axillary lymph nodes with mild cortical thickening. 6 mm indeterminate mass in the 2 o'clock retroareolar left breast. EXAM: ULTRASOUND GUIDED BILATERAL BREAST CORE NEEDLE BIOPSY X 3 COMPARISON:  Previous exam(s). FINDINGS: I met with the patient and we discussed the procedure of ultrasound-guided biopsy, including benefits and alternatives. We discussed the high likelihood of a successful procedure. We discussed the risks of the procedure, including infection, bleeding, tissue injury, clip migration, and inadequate sampling. Informed written consent was given. The usual time-out protocol was performed immediately prior to the procedure. SITE #1: 8 O'CLOCK RIGHT BREAST Lesion quadrant: Lower outer quadrant Using sterile technique and 1% Lidocaine as local anesthetic, under direct ultrasound visualization, a 12 gauge spring-loaded device was used to perform biopsy of the recently demonstrated 3.0 cm mass in the 8 o'clock position of the right breast, 7 cm from the nipple, using a caudal approach. Preliminary ultrasound of that area demonstrated multiple cystic-appearing areas within the 3.0 cm mass. At the conclusion of the procedure a ribbon shaped tissue marker clip was deployed into the biopsy cavity. Follow up 2 view  mammogram was performed and dictated separately. SITE #2: RIGHT AXILLA Using sterile technique and 1% Lidocaine as local anesthetic, under direct ultrasound visualization, a 14 gauge spring-loaded device was used to perform biopsy of 1 of the previously demonstrated 2 right axillary lymph nodes demonstrating mild cortical thickening using a caudal approach. At the conclusion of the procedure a spiral shaped HydroMARK tissue marker clip was deployed into the biopsy cavity. Follow up 2 view mammogram was performed and dictated separately. SITE #3: 2 O'CLOCK RETROAREOLAR LEFT BREAST Lesion quadrant: Upper outer quadrant Using sterile technique and 1% Lidocaine as local anesthetic, under direct ultrasound visualization, a 12 gauge spring-loaded device was used to perform biopsy of the recently demonstrated 6 mm mass in the 2 o'clock retroareolar left breast using a inferolateral approach. At the conclusion of the procedure a coil shaped tissue marker clip was deployed into the biopsy cavity. Follow up 2 view mammogram was performed and dictated separately. IMPRESSION: Ultrasound guided biopsy of a 3.0 cm 8 o'clock right breast mass, right axillary lymph node with mild cortical thickening and 6 mm 2 o'clock retroareolar left breast mass. No apparent complications. Electronically Signed: By: Claudie Revering M.D. On: 03/09/2018 14:37    ELIGIBLE FOR AVAILABLE RESEARCH PROTOCOL: no  ASSESSMENT: 58 y.o. [place] woman status post right  breast lower outer quadrant biopsy 03/09/2018 for a clinical T2N0, stage IIB invasive ductal carcinoma, triple negative, with an MIB-1 of 80%  (1) genetics testing pending  (2) neoadjuvant chemotherapy will consist of cyclophosphamide and doxorubicin in dose dense fashion x4 followed by weekly carboplatin and paclitaxel x12  (3) definitive surgery to follow  (4) adjuvant radiation   PLAN: I spent approximately 60 minutes face to face with Vanassa with more than 50% of that time  spent in counseling and coordination of care. Specifically we reviewed the biology of the patient's diagnosis and the specifics of her situation. We first reviewed the fact that cancer is not one disease but more than 100 different diseases and that it is important to keep them separate-- otherwise when friends and relatives discuss their own cancer experiences with Khristian confusion can result. Similarly we explained that if breast cancer spreads to the bone or liver, the patient would not have bone cancer or liver cancer, but breast cancer in the bone and breast cancer in the liver: one cancer in three places-- not 3 different cancers which otherwise would have to be treated in 3 different ways.  We discussed the difference between local and systemic therapy. In terms of loco-regional treatment, lumpectomy plus radiation is equivalent to mastectomy as far as survival is concerned. For this reason, and because the cosmetic results are generally superior, we recommend breast conserving surgery in most cases.   We also noted that in terms of sequencing of treatments, whether systemic therapy or surgery is done first does not affect the ultimate outcome.  This is relevant to Lataja situation since we believe she will benefit from neoadjuvant chemotherapy in terms of making the surgery easier, given her time to get her genetics results, and assessing response to treatment.  We then discussed the rationale for systemic therapy. There is some risk that this cancer may have already spread to other parts of her body. Patients frequently ask at this point about bone scans, CAT scans and PET scans to find out if they have occult breast cancer somewhere else. The problem is that in early stage disease we are much more likely to find false positives then true cancers and this would expose the patient to unnecessary procedures as well as unnecessary radiation. Scans cannot answer the question the patient really would like  to know, which is whether she has microscopic disease elsewhere in her body. For those reasons we do not recommend them.  Of course we would proceed to aggressive evaluation of any symptoms that might suggest metastatic disease, but that is not the case here.  Next we went over the options for systemic therapy which are anti-estrogens, anti-HER-2 immunotherapy, and chemotherapy. Shalena does not meet criteria for anti-HER-2 immunotherapy or anti-estrogens.  The only form of available systemic therapy to her is chemotherapy and that accordingly is what we recommend  Specifically she will receive cyclophosphamide and doxorubicin in dose dense fashion x4 followed by paclitaxel and carboplatin weekly x12.  Today we discussed the possible toxicity side effects and complications and a preliminary way and she will come and meet with our chemotherapy teaching nurse for further details.  Shaquille will have a port placed, echocardiogram, and then return to see [place] a day before her first chemo to review how to take her supportive medicines.  She will see [place] on day 8 and day 1 of her first several cycles.  We will be working closely with our nursing and infusion teams to  make sure we minimize her electromagnetic exposure (possibly she could be treated in the balcony).  The patient also qualifies for genetics testing. In patients who carry a deleterious mutation [for example in a  BRCA gene], the risk of a new breast cancer developing in the future may be sufficiently great that the patient may choose bilateral mastectomies. However if she wishes to keep her breasts in that situation it is safe to do so. That would require intensified screening, which generally means not only yearly mammography but a yearly breast MRI as well. Of course, if there is a deleterious mutation bilateral oophorectomy would be necessary as there is no standard screening protocol for ovarian cancer.  Trichelle has a good understanding of the  overall plan. She agrees with it. She knows the goal of treatment in her case is cure. She will call with any problems that may develop before her next visit here.  Chauncey Cruel, MD   03/21/2018 2:45 PM Medical Oncology and Hematology [facility] [address] [place], [place] [address] Tel. [phone]    Fax. [phone]  This document serves as a record of services personally performed by Lurline Del, MD. It was created on his behalf by Wilburn Mylar, a trained medical scribe. The creation of this record is based on the scribe's personal observations and the provider's statements to them.   I, Lurline Del MD, have reviewed the above documentation for accuracy and completeness, and I agree with the above.    Addendum.  The patient tells me Dr. Abran Cantor at the [facility] in [place], phone #[phone] has treated a patient like her successfully with chemotherapy.  We will make this information available to our nursing team so they can facilitate the patients meant here.

## 2018-03-22 ENCOUNTER — Encounter: Payer: Self-pay | Admitting: Genetics

## 2018-03-22 DIAGNOSIS — Z801 Family history of malignant neoplasm of trachea, bronchus and lung: Secondary | ICD-10-CM | POA: Insufficient documentation

## 2018-03-22 DIAGNOSIS — Z803 Family history of malignant neoplasm of breast: Secondary | ICD-10-CM | POA: Insufficient documentation

## 2018-03-22 NOTE — Progress Notes (Signed)
REFERRING PROVIDER: Chauncey Cruel, MD Kristina Rivera, [place] [address]  PRIMARY PROVIDER:  Jonathon Jordan, MD  PRIMARY REASON FOR VISIT:  1. Malignant neoplasm of lower-outer quadrant of right breast of female, estrogen receptor negative (Kristina Rivera)   2. Family history of breast cancer   3. Family history of lung cancer    HISTORY OF PRESENT ILLNESS:   Kristina Rivera, a 58 y.o. female, was seen for a [facility] cancer genetics consultation at the request of Dr. Jana Rivera due to a personal and family history of cancer.  Kristina Rivera presents to clinic today to discuss the possibility of a hereditary predisposition to cancer, genetic testing, and to further clarify her future cancer risks, as well as potential cancer risks for family members.   In Dec 2019, at the age of 82, Kristina Rivera was diagnosed with High Grade Carcinoma of the right breast. Triple negative.  She currently is planning neoadjuvant chemotherapy followed by surgery and adjuvant radiation.   CANCER HISTORY:    Malignant neoplasm of lower-outer quadrant of right breast of female, estrogen receptor negative (Kristina Rivera)   03/15/2018 Initial Diagnosis    Malignant neoplasm of lower-outer quadrant of right breast of female, estrogen receptor negative (Kristina Rivera)    04/03/2018 -  Chemotherapy    The patient had DOXOrubicin (ADRIAMYCIN) chemo injection 148 mg, 60 mg/m2, Intravenous,  Once, 0 of 4 cycles PALONOSETRON HCL INJECTION 0.25 MG/5ML, 0.25 mg, Intravenous,  Once, 0 of 8 cycles pegfilgrastim-cbqv (UDENYCA) injection 6 mg, 6 mg, Subcutaneous, Once, 0 of 4 cycles CARBOplatin (PARAPLATIN) in sodium chloride 0.9 % 100 mL chemo infusion, , Intravenous,  Once, 0 of 4 cycles cyclophosphamide (CYTOXAN) 1,480 mg in sodium chloride 0.9 % 250 mL chemo infusion, 600 mg/m2, Intravenous,  Once, 0 of 4 cycles PACLitaxel (TAXOL) 198 mg in sodium chloride 0.9 % 250 mL chemo infusion (</= 9m/m2), 80 mg/m2, Intravenous,  Once, 0 of 4  cycles FOSAPREPITANT 150MG + DEXAMETHASONE INFUSION [facility], , Intravenous,  Once, 0 of 8 cycles  for chemotherapy treatment.       HORMONAL RISK FACTORS:  Menarche was at age 58  First live birth at age 89  Ovaries intact: yes.  Hysterectomy: no.  Menopausal status: postmenopausal.  HRT use: 0 years. Colonoscopy: yes; reports 3 years ago, no polyps.   Past Medical History:  Diagnosis Date  . Anxiety   . Arthritis    "back; knees; shoulders" (04/19/2013)  . Asthma   . Chronic lower back pain   . Daily headache    "last couple months" (04/19/2013)  . Depression   . Family history of breast cancer   . Family history of lung cancer     Past Surgical History:  Procedure Laterality Date  . CARPAL TUNNEL RELEASE Left 1999?  . TUBAL LIGATION  1993    Social History   Socioeconomic History  . Marital status: Married    Spouse name: Kristina Rivera . Number of children: 5  . Years of education: Not on file  . Highest education level: Not on file  Occupational History  . Not on file  Social Needs  . Financial resource strain: Not on file  . Food insecurity:    Worry: Not on file    Inability: Not on file  . Transportation needs:    Medical: Not on file    Non-medical: Not on file  Tobacco Use  . Smoking status: Never Smoker  . Smokeless tobacco: Never Used  Substance and Sexual  Activity  . Alcohol use: No  . Drug use: No  . Sexual activity: Yes    Birth control/protection: Post-menopausal  Lifestyle  . Physical activity:    Days per week: Not on file    Minutes per session: Not on file  . Stress: Not on file  Relationships  . Social connections:    Talks on phone: Not on file    Gets together: Not on file    Attends religious service: Not on file    Active member of club or organization: Not on file    Attends meetings of clubs or organizations: Not on file    Relationship status: Not on file  Other Topics Concern  . Not on file  Social History Narrative  . Not  on file     FAMILY HISTORY:  We obtained a detailed, 4-generation family history.  Significant diagnoses are listed below: Family History  Problem Relation Age of Onset  . Breast cancer Maternal Aunt 69  . Pneumonia Mother   . Lung cancer Mother 45  . Stroke Father   . Head & neck cancer Sister 30       neck  . Prostate cancer Neg Hx     Kristina Rivera has 3 daughters and 2 sons ages 40-40's with no hx of cance.r  She has 3 full sisters and 3 full brothers with no hx of cancer.  She also has 2 paternal half-sisters and 1 paternal half-brother. 1 half-sister had neck cancer dx I her 30's-she had a hx of smoking.   Kristina Rivera father: died at 65 due to stroke.  Paternal aunts/Uncles: 8 paternal aunts/uncles- no hx of cancer reported I these relatives. Paternal cousins: no known hs of cancer.  Paternal grandfather: unk Paternal grandmother:unk  Kristina Rivera mother: died at 19.  She had pneumonia and lung cancer.  Maternal Aunts/Uncles: 4 maternal aunts/uncles. 1 aunt was dx with breast cancer at 35.  Maternal cousins: no kn Maternal grandfather: unk Maternal grandmother:died at 41 with TB.  Kristina Rivera is unaware of previous family history of genetic testing for hereditary cancer risks. Patient's ancestry through commercial ancestry testing was: African, 25% Ashkenazi [place], [place]. There is no known consanguinity.  GENETIC COUNSELING ASSESSMENT: Kristina Rivera is a 58 y.o. female with a personal and family history which is somewhat suggestive of a Hereditary Cancer Predisposition Syndrome. We, therefore, discussed and recommended the following at today's visit.   DISCUSSION: We reviewed the characteristics, features and inheritance patterns of hereditary cancer syndromes. We also discussed genetic testing, including the appropriate family members to test, the process of testing, insurance coverage and turn-around-time for results. We discussed the implications of a negative, positive  and/or variant of uncertain significant result. In order to get genetic test results in a timely manner so that Kristina Rivera we recommended Kristina Rivera pursue genetic testing for the Breast Cancer STAT panel. We then recommend Kristina Rivera pursue reflex genetic testing to the Common Hereditary Cancers Panel.  The STAT Breast cancer panel offered by [facility] includes sequencing and rearrangement analysis for the following 9 genes:  ATM, BRCA1, BRCA2, CDH1, CHEK2, PALB2, PTEN, STK11 and TP53.     The Common Hereditary Cancer Panel offered by [facility] includes sequencing and/or deletion duplication testing of the following 53 genes: APC, ATM, AXIN2, BARD1, BMPR1A, BRCA1, BRCA2, BRIP1, BUB1B, CDH1, CDK4, CDKN2A, CHEK2, CTNNA1, DICER1, ENG, EPCAM, GALNT12, GREM1, HOXB13, KIT, MEN1, MLH1, MLH3, MSH2, MSH3, MSH6, MUTYH, NBN, NF1, NTHL1, PALB2, PDGFRA,  PMS2, POLD1, POLE, PTEN, RAD50, RAD51C, RAD51D, RNF43, RPS20, SDHA, SDHB, SDHC, SDHD, SMAD4, SMARCA4, STK11, TP53, TSC1, TSC2, VHL  We discussed that only 5-10% of cancers are associated with a Hereditary cancer predisposition syndrome.  One of the most common hereditary cancer syndromes that increases breast cancer risk is called Hereditary Breast and Ovarian Cancer (HBOC) syndrome.  This syndrome is caused by mutations in the BRCA1 and BRCA2 genes.  This syndrome increases an individual's lifetime risk to develop breast, ovarian, pancreatic, and other types of cancer.  There are also many other cancer predisposition syndromes caused by mutations in several other genes.  We discussed that if she is found to have a mutation in one of these genes, it may impact surgical decisions, and alter future medical management recommendations such as increased cancer screenings and consideration of risk reducing surgeries.  A positive result could also have implications for the patient's family members.  A Negative result would mean we were unable to identify a hereditary component to  her cancer, but does not rule out the possibility of a hereditary basis for her cancer.  There could be mutations that are undetectable by current technology, or in genes not yet tested or identified to increase cancer risk.    We discussed the potential to find a Variant of Uncertain Significance or VUS.  These are variants that have not yet been identified as pathogenic or benign, and it is unknown if this variant is associated with increased cancer risk or if this is a normal finding.  Most VUS's are reclassified to benign or likely benign.   It should not be used to make medical management decisions. With time, we suspect the lab will determine the significance of any VUS's identified if any.   Based on Kristina Rivera's personal and family history of cancer, she meets medical criteria for genetic testing. Despite that she meets criteria, she may still have an out of pocket cost. The laboratory can provide her with an estimate of her OOP cost. she was given the contact information of the laboratory if she has further questions.   PLAN: After considering the risks, benefits, and limitations, Kristina Rivera  provided informed consent to pursue genetic testing and the blood sample was sent to [facility] for analysis of the Breast Cancer STAT panel with plans to reflex to the Common Hereditary Cancers Panel. Preliminary results should be available within approximately 5-10 days' time, at which point they will be disclosed by telephone to Kristina Rivera, as will any additional recommendations warranted by these results. Kristina Rivera will receive a summary of her genetic counseling visit and a copy of her results once available. This information will also be available in Epic. We encouraged Kristina Rivera to remain in contact with cancer genetics annually so that we can continuously update the family history and inform her of any changes in cancer genetics and testing that may be of benefit for her family. Kristina Rivera  questions were answered to her satisfaction today. Our contact information was provided should additional questions or concerns arise.  Based on Kristina Rivera's family history, we recommended her maternal relatives also have genetic counseling and testing. Ms. Ohagan will let us know if we can be of any assistance in coordinating genetic counseling and/or testing for this family member.   Lastly, we encouraged Ms. Shimko to remain in contact with cancer genetics annually so that we can continuously update the family history and inform her of any changes in cancer genetics  and testing that may be of benefit for this family.   Ms.  Tamayo questions were answered to her satisfaction today. Our contact information was provided should additional questions or concerns arise. Thank you for the referral and allowing [place] to share in the care of your patient.   Tana Felts, MS, [facility] Certified Genetic Counselor .[email or web address] phone: [phone]  The patient was seen for a total of 15 minutes in face-to-face genetic counseling.  The patient was accompanied today by her partner. This patient was discussed with Drs. Magrinat, Lindi Adie and/or Burr Medico who agrees with the above.

## 2018-03-25 ENCOUNTER — Ambulatory Visit
Admission: RE | Admit: 2018-03-25 | Discharge: 2018-03-25 | Disposition: A | Discharge status: Home / Self Care | Payer: Medicare HMO | Source: Ambulatory Visit | Attending: General Surgery | Admitting: General Surgery

## 2018-03-25 DIAGNOSIS — Z171 Estrogen receptor negative status [ER-]: Principal | ICD-10-CM

## 2018-03-25 DIAGNOSIS — C50511 Malignant neoplasm of lower-outer quadrant of right female breast: Secondary | ICD-10-CM

## 2018-03-26 ENCOUNTER — Encounter: Payer: Self-pay | Admitting: Oncology

## 2018-03-26 NOTE — Pre-Procedure Instructions (Signed)
SHAQUISHA WYNN  03/26/2018      [facility], [place] - [address] [place] [address] [place] [address] Phone: [phone] Fax: [phone]    Your procedure is scheduled on January 15th.  Report to [facility] Admitting at 10:00 A.M.  Call this number if you have problems the morning of surgery:  [phone]   Remember:  Do not eat or drink after midnight.   You may drink clear liquids until 9:00 AM .  Clear liquids allowed are: Water, Juice (non-citric and without pulp), Carbonated beverages, Clear Tea, Black Coffee only, Plain Jell-O only and Gatorade    Take these medicines the morning of surgery with A SIP OF WATER  Tylenol - if needed  Albuterol Inhaler - if needed  7 days prior to surgery STOP taking any Aspirin (unless otherwise instructed by your surgeon), Aleve, Naproxen, Ibuprofen, Motrin, Advil, Goody's, BC's, all herbal medications, fish oil, and all vitamins.     Do not wear jewelry, make-up or nail polish.  Do not wear lotions, powders, or perfumes, or deodorant.  Do not shave 48 hours prior to surgery.  Men may shave face and neck.  Do not bring valuables to the hospital.  [facility] is not responsible for any belongings or valuables.   [place]- Preparing For Surgery  Before surgery, you can play an important role. Because skin is not sterile, your skin needs to be as free of germs as possible. You can reduce the number of germs on your skin by washing with CHG (chlorahexidine gluconate) Soap before surgery.  CHG is an antiseptic cleaner which kills germs and bonds with the skin to continue killing germs even after washing.    Oral Hygiene is also important to reduce your risk of infection.  Remember - BRUSH YOUR TEETH THE MORNING OF SURGERY WITH YOUR REGULAR TOOTHPASTE  Please do not use if you have an allergy to CHG or antibacterial soaps. If your  skin becomes reddened/irritated stop using the CHG.  Do not shave (including legs and underarms) for at least 48 hours prior to first CHG shower. It is OK to shave your face.  Please follow these instructions carefully.   1. Shower the NIGHT BEFORE SURGERY and the MORNING OF SURGERY with CHG.   2. If you chose to wash your hair, wash your hair first as usual with your normal shampoo.  3. After you shampoo, rinse your hair and body thoroughly to remove the shampoo.  4. Use CHG as you would any other liquid soap. You can apply CHG directly to the skin and wash gently with a scrungie or a clean washcloth.   5. Apply the CHG Soap to your body ONLY FROM THE NECK DOWN.  Do not use on open wounds or open sores. Avoid contact with your eyes, ears, mouth and genitals (private parts). Wash Face and genitals (private parts)  with your normal soap.  6. Wash thoroughly, paying special attention to the area where your surgery will be performed.  7. Thoroughly rinse your body with warm water from the neck down.  8. DO NOT shower/wash with your normal soap after using and rinsing off the CHG Soap.  9. Pat yourself dry with a CLEAN TOWEL.  10. Wear CLEAN PAJAMAS to bed the night before surgery, wear comfortable clothes the morning of surgery  11. Place CLEAN SHEETS on your bed the  night of your first shower and DO NOT SLEEP WITH PETS.   Day of Surgery:  Do not apply any deodorants/lotions.  Please wear clean clothes to the hospital/surgery center.   Remember to brush your teeth WITH YOUR REGULAR TOOTHPASTE.   Contacts, dentures or bridgework may not be worn into surgery.  Leave your suitcase in the car.  After surgery it may be brought to your room.  For patients admitted to the hospital, discharge time will be determined by your treatment team.  Patients discharged the day of surgery will not be allowed to drive home.   Please read over the following fact sheets that you were  given. Coughing and Deep Breathing and Surgical Site Infection Prevention

## 2018-03-26 NOTE — Progress Notes (Signed)
Called patient to introduce myself as Arboriculturist and to offer available resources.  Advised patient of PAF foundation which has available funds for her diagnosis if interested in applying. She states she is. Advised what is needed to apply. She verbalized understanding and will send information to me.  Discussed the one-time $1000 Radio broadcast assistant. Based on verbal income, she does qualify. She will provide me with documentation.  Gave her my contact name and number and emailed her with my email address.

## 2018-03-26 NOTE — Progress Notes (Signed)
Patient emailed income information for purposes of applying for copay assistance.  Applied online via PAF for copay assistance on patient's behalf. Patient approved for $16,000 maximum amount 03/26/18 - 03/26/19.    I will provide Lenise a copy of the approval letter for billing/copay monitoring.  Will have patient sign approval letter for [employer] grant at her next visit and provide her with a copy of the approval letter as well as expense sheet and go over.  Called patient to inform her and she verbalized understanding.

## 2018-03-27 ENCOUNTER — Other Ambulatory Visit: Payer: Self-pay

## 2018-03-27 ENCOUNTER — Encounter (HOSPITAL_COMMUNITY)
Admission: RE | Admit: 2018-03-27 | Discharge: 2018-03-27 | Disposition: A | Discharge status: Home / Self Care | Payer: Medicare HMO | Source: Ambulatory Visit | Attending: General Surgery | Admitting: General Surgery

## 2018-03-27 ENCOUNTER — Encounter (HOSPITAL_COMMUNITY): Payer: Self-pay

## 2018-03-27 DIAGNOSIS — Z6841 Body Mass Index (BMI) 40.0 and over, adult: Secondary | ICD-10-CM | POA: Diagnosis not present

## 2018-03-27 DIAGNOSIS — Z836 Family history of other diseases of the respiratory system: Secondary | ICD-10-CM | POA: Diagnosis not present

## 2018-03-27 DIAGNOSIS — D571 Sickle-cell disease without crisis: Secondary | ICD-10-CM | POA: Diagnosis not present

## 2018-03-27 DIAGNOSIS — Z8249 Family history of ischemic heart disease and other diseases of the circulatory system: Secondary | ICD-10-CM | POA: Diagnosis not present

## 2018-03-27 DIAGNOSIS — Z9109 Other allergy status, other than to drugs and biological substances: Secondary | ICD-10-CM | POA: Diagnosis not present

## 2018-03-27 DIAGNOSIS — F419 Anxiety disorder, unspecified: Secondary | ICD-10-CM | POA: Diagnosis not present

## 2018-03-27 DIAGNOSIS — C50511 Malignant neoplasm of lower-outer quadrant of right female breast: Secondary | ICD-10-CM | POA: Diagnosis not present

## 2018-03-27 DIAGNOSIS — R739 Hyperglycemia, unspecified: Secondary | ICD-10-CM | POA: Diagnosis not present

## 2018-03-27 DIAGNOSIS — Z832 Family history of diseases of the blood and blood-forming organs and certain disorders involving the immune mechanism: Secondary | ICD-10-CM | POA: Diagnosis not present

## 2018-03-27 DIAGNOSIS — Z803 Family history of malignant neoplasm of breast: Secondary | ICD-10-CM | POA: Diagnosis not present

## 2018-03-27 DIAGNOSIS — I10 Essential (primary) hypertension: Secondary | ICD-10-CM | POA: Diagnosis not present

## 2018-03-27 DIAGNOSIS — Z833 Family history of diabetes mellitus: Secondary | ICD-10-CM | POA: Diagnosis not present

## 2018-03-27 DIAGNOSIS — Z808 Family history of malignant neoplasm of other organs or systems: Secondary | ICD-10-CM | POA: Diagnosis not present

## 2018-03-27 DIAGNOSIS — J45909 Unspecified asthma, uncomplicated: Secondary | ICD-10-CM | POA: Diagnosis not present

## 2018-03-27 DIAGNOSIS — Z171 Estrogen receptor negative status [ER-]: Secondary | ICD-10-CM | POA: Diagnosis not present

## 2018-03-27 DIAGNOSIS — Z01812 Encounter for preprocedural laboratory examination: Secondary | ICD-10-CM | POA: Insufficient documentation

## 2018-03-27 DIAGNOSIS — F329 Major depressive disorder, single episode, unspecified: Secondary | ICD-10-CM | POA: Diagnosis not present

## 2018-03-27 DIAGNOSIS — I4510 Unspecified right bundle-branch block: Secondary | ICD-10-CM | POA: Diagnosis not present

## 2018-03-27 DIAGNOSIS — R51 Headache: Secondary | ICD-10-CM | POA: Diagnosis not present

## 2018-03-27 DIAGNOSIS — M549 Dorsalgia, unspecified: Secondary | ICD-10-CM | POA: Diagnosis not present

## 2018-03-27 DIAGNOSIS — Z823 Family history of stroke: Secondary | ICD-10-CM | POA: Diagnosis not present

## 2018-03-27 DIAGNOSIS — Z801 Family history of malignant neoplasm of trachea, bronchus and lung: Secondary | ICD-10-CM | POA: Diagnosis not present

## 2018-03-27 DIAGNOSIS — X58XXXA Exposure to other specified factors, initial encounter: Secondary | ICD-10-CM | POA: Diagnosis not present

## 2018-03-27 DIAGNOSIS — R011 Cardiac murmur, unspecified: Secondary | ICD-10-CM | POA: Diagnosis not present

## 2018-03-27 DIAGNOSIS — M1990 Unspecified osteoarthritis, unspecified site: Secondary | ICD-10-CM | POA: Diagnosis not present

## 2018-03-27 LAB — BASIC METABOLIC PANEL
Anion gap: 9 (ref 5–15)
BUN: 9 mg/dL (ref 6–20)
CALCIUM: 9.3 mg/dL (ref 8.9–10.3)
CO2: 27 mmol/L (ref 22–32)
CREATININE: 0.77 mg/dL (ref 0.44–1.00)
Chloride: 104 mmol/L (ref 98–111)
GFR calc Af Amer: >60 mL/min (ref >60)
GFR calc non Af Amer: >60 mL/min (ref >60)
Glucose, Bld: 102 mg/dL — ABNORMAL HIGH (ref 70–99)
Potassium: 4.1 mmol/L (ref 3.5–5.1)
Sodium: 140 mmol/L (ref 135–145)

## 2018-03-27 LAB — CBC
HCT: 42.6 % (ref 36.0–46.0)
Hemoglobin: 12.2 g/dL (ref 12.0–15.0)
MCH: 21.4 pg — ABNORMAL LOW (ref 26.0–34.0)
MCHC: 28.6 g/dL — ABNORMAL LOW (ref 30.0–36.0)
MCV: 74.6 fL — ABNORMAL LOW (ref 80.0–100.0)
PLATELETS: 285 10*3/uL (ref 150–400)
RBC: 5.71 MIL/uL — ABNORMAL HIGH (ref 3.87–5.11)
RDW: 15.3 % (ref 11.5–15.5)
WBC: 8.7 10*3/uL (ref 4.0–10.5)
nRBC: 0 % (ref 0.0–0.2)

## 2018-03-27 MED ORDER — DEXTROSE 5 % IV SOLN
3.0000 g | INTRAVENOUS | Status: DC
  Filled 2018-03-27: qty 3000

## 2018-03-27 NOTE — Progress Notes (Signed)
PCP - Dr. Jonathon Jordan Cardiologist - denies  Chest x-ray - N/A EKG - N/A Stress Test - denies  ECHO - denies Cardiac Cath - denies  Sleep Study - denies  Aspirin Instructions: Patient instructed to hold all Aspirin, NSAID's, herbal medications, fish oil and vitamins 7 days prior to surgery.   Anesthesia review: no Patient denies shortness of breath, fever, cough and chest pain at PAT appointment   Patient verbalized understanding of instructions that were given to them at the PAT appointment. Patient was also instructed that they will need to review over the PAT instructions again at home before surgery.

## 2018-03-28 ENCOUNTER — Other Ambulatory Visit: Payer: Self-pay

## 2018-03-28 ENCOUNTER — Encounter (HOSPITAL_COMMUNITY)
Admission: RE | Admit: 2018-03-28 | Discharge: 2018-03-28 | Disposition: A | Discharge status: Home / Self Care | Payer: Medicare HMO | Source: Ambulatory Visit | Attending: General Surgery | Admitting: General Surgery

## 2018-03-28 ENCOUNTER — Encounter (HOSPITAL_COMMUNITY)
Admission: RE | Disposition: A | Discharge status: Home / Self Care | Payer: Self-pay | Source: Home / Self Care | Attending: General Surgery

## 2018-03-28 ENCOUNTER — Ambulatory Visit (HOSPITAL_COMMUNITY)
Admission: RE | Admit: 2018-03-28 | Discharge: 2018-03-28 | Disposition: A | Discharge status: Home / Self Care | Payer: Medicare HMO | Attending: General Surgery | Admitting: General Surgery

## 2018-03-28 ENCOUNTER — Ambulatory Visit (HOSPITAL_COMMUNITY): Payer: Medicare HMO | Admitting: Certified Registered Nurse Anesthetist

## 2018-03-28 ENCOUNTER — Encounter (HOSPITAL_COMMUNITY): Payer: Self-pay

## 2018-03-28 ENCOUNTER — Other Ambulatory Visit: Payer: Self-pay | Admitting: *Deleted

## 2018-03-28 DIAGNOSIS — Z79899 Other long term (current) drug therapy: Secondary | ICD-10-CM | POA: Insufficient documentation

## 2018-03-28 DIAGNOSIS — Z832 Family history of diseases of the blood and blood-forming organs and certain disorders involving the immune mechanism: Secondary | ICD-10-CM | POA: Insufficient documentation

## 2018-03-28 DIAGNOSIS — C50511 Malignant neoplasm of lower-outer quadrant of right female breast: Secondary | ICD-10-CM | POA: Diagnosis not present

## 2018-03-28 DIAGNOSIS — Z9109 Other allergy status, other than to drugs and biological substances: Secondary | ICD-10-CM | POA: Diagnosis not present

## 2018-03-28 DIAGNOSIS — J45909 Unspecified asthma, uncomplicated: Secondary | ICD-10-CM | POA: Diagnosis not present

## 2018-03-28 DIAGNOSIS — R51 Headache: Secondary | ICD-10-CM | POA: Insufficient documentation

## 2018-03-28 DIAGNOSIS — Z836 Family history of other diseases of the respiratory system: Secondary | ICD-10-CM | POA: Insufficient documentation

## 2018-03-28 DIAGNOSIS — D571 Sickle-cell disease without crisis: Secondary | ICD-10-CM | POA: Insufficient documentation

## 2018-03-28 DIAGNOSIS — R9431 Abnormal electrocardiogram [ECG] [EKG]: Secondary | ICD-10-CM | POA: Diagnosis not present

## 2018-03-28 DIAGNOSIS — M1990 Unspecified osteoarthritis, unspecified site: Secondary | ICD-10-CM | POA: Insufficient documentation

## 2018-03-28 DIAGNOSIS — J4520 Mild intermittent asthma, uncomplicated: Secondary | ICD-10-CM | POA: Diagnosis not present

## 2018-03-28 DIAGNOSIS — Z8249 Family history of ischemic heart disease and other diseases of the circulatory system: Secondary | ICD-10-CM | POA: Insufficient documentation

## 2018-03-28 DIAGNOSIS — I10 Essential (primary) hypertension: Secondary | ICD-10-CM | POA: Insufficient documentation

## 2018-03-28 DIAGNOSIS — Z833 Family history of diabetes mellitus: Secondary | ICD-10-CM | POA: Insufficient documentation

## 2018-03-28 DIAGNOSIS — I491 Atrial premature depolarization: Secondary | ICD-10-CM | POA: Insufficient documentation

## 2018-03-28 DIAGNOSIS — Z808 Family history of malignant neoplasm of other organs or systems: Secondary | ICD-10-CM | POA: Insufficient documentation

## 2018-03-28 DIAGNOSIS — R0789 Other chest pain: Secondary | ICD-10-CM | POA: Diagnosis not present

## 2018-03-28 DIAGNOSIS — F419 Anxiety disorder, unspecified: Secondary | ICD-10-CM | POA: Insufficient documentation

## 2018-03-28 DIAGNOSIS — Z171 Estrogen receptor negative status [ER-]: Secondary | ICD-10-CM | POA: Insufficient documentation

## 2018-03-28 DIAGNOSIS — C50911 Malignant neoplasm of unspecified site of right female breast: Secondary | ICD-10-CM | POA: Diagnosis not present

## 2018-03-28 DIAGNOSIS — F329 Major depressive disorder, single episode, unspecified: Secondary | ICD-10-CM | POA: Insufficient documentation

## 2018-03-28 DIAGNOSIS — Z823 Family history of stroke: Secondary | ICD-10-CM | POA: Insufficient documentation

## 2018-03-28 DIAGNOSIS — Z6841 Body Mass Index (BMI) 40.0 and over, adult: Secondary | ICD-10-CM | POA: Insufficient documentation

## 2018-03-28 DIAGNOSIS — Z801 Family history of malignant neoplasm of trachea, bronchus and lung: Secondary | ICD-10-CM | POA: Diagnosis not present

## 2018-03-28 DIAGNOSIS — R011 Cardiac murmur, unspecified: Secondary | ICD-10-CM | POA: Insufficient documentation

## 2018-03-28 DIAGNOSIS — I4510 Unspecified right bundle-branch block: Secondary | ICD-10-CM | POA: Insufficient documentation

## 2018-03-28 DIAGNOSIS — S3992XA Unspecified injury of lower back, initial encounter: Secondary | ICD-10-CM | POA: Insufficient documentation

## 2018-03-28 DIAGNOSIS — R002 Palpitations: Secondary | ICD-10-CM | POA: Diagnosis not present

## 2018-03-28 DIAGNOSIS — M549 Dorsalgia, unspecified: Secondary | ICD-10-CM | POA: Insufficient documentation

## 2018-03-28 DIAGNOSIS — X58XXXA Exposure to other specified factors, initial encounter: Secondary | ICD-10-CM | POA: Insufficient documentation

## 2018-03-28 DIAGNOSIS — Z803 Family history of malignant neoplasm of breast: Secondary | ICD-10-CM | POA: Insufficient documentation

## 2018-03-28 DIAGNOSIS — R739 Hyperglycemia, unspecified: Secondary | ICD-10-CM | POA: Insufficient documentation

## 2018-03-28 HISTORY — PX: BREAST LUMPECTOMY WITH AXILLARY LYMPH NODE BIOPSY: SHX5593

## 2018-03-28 LAB — POCT I-STAT 4, (NA,K, GLUC, HGB,HCT)
Glucose, Bld: 106 mg/dL — ABNORMAL HIGH (ref 70–99)
HCT: 40 % (ref 36.0–46.0)
Hemoglobin: 13.6 g/dL (ref 12.0–15.0)
Potassium: 3.8 mmol/L (ref 3.5–5.1)
Sodium: 142 mmol/L (ref 135–145)

## 2018-03-28 SURGERY — BREAST LUMPECTOMY WITH AXILLARY LYMPH NODE BIOPSY
Anesthesia: General | Site: Breast | Laterality: Right

## 2018-03-28 MED ORDER — 0.9 % SODIUM CHLORIDE (POUR BTL) OPTIME
TOPICAL | Status: DC | PRN
  Administered 2018-03-28: 1000 mL

## 2018-03-28 MED ORDER — PHENYLEPHRINE 40 MCG/ML (10ML) SYRINGE FOR IV PUSH (FOR BLOOD PRESSURE SUPPORT)
PREFILLED_SYRINGE | INTRAVENOUS | Status: DC | PRN
  Administered 2018-03-28: 120 ug via INTRAVENOUS
  Administered 2018-03-28 (×2): 80 ug via INTRAVENOUS
  Administered 2018-03-28: 120 ug via INTRAVENOUS

## 2018-03-28 MED ORDER — ACETAMINOPHEN 500 MG PO TABS
ORAL_TABLET | ORAL | Status: AC
  Administered 2018-03-28: 1000 mg via ORAL
  Filled 2018-03-28: qty 2

## 2018-03-28 MED ORDER — LIDOCAINE 2% (20 MG/ML) 5 ML SYRINGE
INTRAMUSCULAR | Status: DC | PRN
  Administered 2018-03-28: 100 mg via INTRAVENOUS

## 2018-03-28 MED ORDER — SUGAMMADEX SODIUM 500 MG/5ML IV SOLN
INTRAVENOUS | Status: AC
  Filled 2018-03-28: qty 5

## 2018-03-28 MED ORDER — ACETAMINOPHEN 500 MG PO TABS
1000.0000 mg | ORAL_TABLET | ORAL | Status: AC
  Administered 2018-03-28: 1000 mg via ORAL

## 2018-03-28 MED ORDER — EPHEDRINE 5 MG/ML INJ
INTRAVENOUS | Status: AC
  Filled 2018-03-28: qty 10

## 2018-03-28 MED ORDER — ROCURONIUM BROMIDE 50 MG/5ML IV SOSY
PREFILLED_SYRINGE | INTRAVENOUS | Status: AC
  Filled 2018-03-28: qty 5

## 2018-03-28 MED ORDER — SUCCINYLCHOLINE CHLORIDE 200 MG/10ML IV SOSY
PREFILLED_SYRINGE | INTRAVENOUS | Status: AC
  Filled 2018-03-28: qty 20

## 2018-03-28 MED ORDER — TECHNETIUM TC 99M SULFUR COLLOID FILTERED
1.0000 | Freq: Once | INTRAVENOUS | Status: AC | PRN
  Administered 2018-03-28: 1 via INTRADERMAL

## 2018-03-28 MED ORDER — LIDOCAINE 2% (20 MG/ML) 5 ML SYRINGE
INTRAMUSCULAR | Status: AC
  Filled 2018-03-28: qty 5

## 2018-03-28 MED ORDER — LIDOCAINE HCL (PF) 1 % IJ SOLN
INTRAMUSCULAR | Status: AC
  Filled 2018-03-28: qty 30

## 2018-03-28 MED ORDER — MIDAZOLAM HCL 2 MG/2ML IJ SOLN
INTRAMUSCULAR | Status: DC | PRN
  Administered 2018-03-28: 2 mg via INTRAVENOUS

## 2018-03-28 MED ORDER — FENTANYL CITRATE (PF) 250 MCG/5ML IJ SOLN
INTRAMUSCULAR | Status: AC
  Filled 2018-03-28: qty 5

## 2018-03-28 MED ORDER — HEPARIN SOD (PORK) LOCK FLUSH 100 UNIT/ML IV SOLN
INTRAVENOUS | Status: AC
  Filled 2018-03-28: qty 5

## 2018-03-28 MED ORDER — BUPIVACAINE-EPINEPHRINE (PF) 0.25% -1:200000 IJ SOLN
INTRAMUSCULAR | Status: AC
  Filled 2018-03-28: qty 30

## 2018-03-28 MED ORDER — OXYCODONE HCL 5 MG PO TABS: 5.0000 mg | ORAL_TABLET | Freq: Four times a day (QID) | ORAL | 0 refills | Status: DC | PRN

## 2018-03-28 MED ORDER — SODIUM CHLORIDE 0.9 % IV SOLN
INTRAVENOUS | Status: AC
  Filled 2018-03-28: qty 1.2

## 2018-03-28 MED ORDER — LACTATED RINGERS IV SOLN
INTRAVENOUS | Status: DC
  Administered 2018-03-28 (×2): via INTRAVENOUS

## 2018-03-28 MED ORDER — PROPOFOL 10 MG/ML IV BOLUS
INTRAVENOUS | Status: AC
  Filled 2018-03-28: qty 20

## 2018-03-28 MED ORDER — ONDANSETRON HCL 4 MG/2ML IJ SOLN
INTRAMUSCULAR | Status: AC
  Filled 2018-03-28: qty 2

## 2018-03-28 MED ORDER — ROCURONIUM BROMIDE 10 MG/ML (PF) SYRINGE
PREFILLED_SYRINGE | INTRAVENOUS | Status: DC | PRN
  Administered 2018-03-28: 30 mg via INTRAVENOUS

## 2018-03-28 MED ORDER — DEXAMETHASONE SODIUM PHOSPHATE 10 MG/ML IJ SOLN
INTRAMUSCULAR | Status: AC
  Filled 2018-03-28: qty 1

## 2018-03-28 MED ORDER — DEXTROSE 5 % IV SOLN
INTRAVENOUS | Status: DC | PRN
  Administered 2018-03-28: 3 g via INTRAVENOUS

## 2018-03-28 MED ORDER — METHYLENE BLUE 0.5 % INJ SOLN
INTRAVENOUS | Status: AC
  Filled 2018-03-28: qty 10

## 2018-03-28 MED ORDER — EPHEDRINE SULFATE-NACL 50-0.9 MG/10ML-% IV SOSY
PREFILLED_SYRINGE | INTRAVENOUS | Status: DC | PRN
  Administered 2018-03-28: 15 mg via INTRAVENOUS
  Administered 2018-03-28 (×2): 10 mg via INTRAVENOUS

## 2018-03-28 MED ORDER — MIDAZOLAM HCL 2 MG/2ML IJ SOLN
INTRAMUSCULAR | Status: AC
  Filled 2018-03-28: qty 2

## 2018-03-28 MED ORDER — CHLORHEXIDINE GLUCONATE CLOTH 2 % EX PADS: 6.0000 | MEDICATED_PAD | Freq: Once | CUTANEOUS | Status: DC

## 2018-03-28 MED ORDER — GABAPENTIN 300 MG PO CAPS
ORAL_CAPSULE | ORAL | Status: AC
  Administered 2018-03-28: 300 mg via ORAL
  Filled 2018-03-28: qty 1

## 2018-03-28 MED ORDER — LIDOCAINE HCL 1 % IJ SOLN
INTRAMUSCULAR | Status: DC | PRN
  Administered 2018-03-28: 30 mL via SUBCUTANEOUS

## 2018-03-28 MED ORDER — GABAPENTIN 300 MG PO CAPS
300.0000 mg | ORAL_CAPSULE | ORAL | Status: AC
  Administered 2018-03-28: 300 mg via ORAL

## 2018-03-28 MED ORDER — FENTANYL CITRATE (PF) 100 MCG/2ML IJ SOLN: 25.0000 ug | INTRAMUSCULAR | Status: DC | PRN

## 2018-03-28 MED ORDER — ONDANSETRON HCL 4 MG/2ML IJ SOLN
INTRAMUSCULAR | Status: DC | PRN
  Administered 2018-03-28: 4 mg via INTRAVENOUS

## 2018-03-28 MED ORDER — SUCCINYLCHOLINE CHLORIDE 200 MG/10ML IV SOSY
PREFILLED_SYRINGE | INTRAVENOUS | Status: DC | PRN
  Administered 2018-03-28: 180 mg via INTRAVENOUS

## 2018-03-28 MED ORDER — PROPOFOL 10 MG/ML IV BOLUS
INTRAVENOUS | Status: DC | PRN
  Administered 2018-03-28: 200 mg via INTRAVENOUS

## 2018-03-28 MED ORDER — SODIUM CHLORIDE 0.9 % IV SOLN
INTRAVENOUS | Status: DC | PRN
  Administered 2018-03-28: 70 ug/min via INTRAVENOUS

## 2018-03-28 MED ORDER — ALBUMIN HUMAN 5 % IV SOLN
INTRAVENOUS | Status: DC | PRN
  Administered 2018-03-28: 13:00:00 via INTRAVENOUS

## 2018-03-28 MED ORDER — FENTANYL CITRATE (PF) 100 MCG/2ML IJ SOLN
INTRAMUSCULAR | Status: AC
  Filled 2018-03-28: qty 2

## 2018-03-28 MED ORDER — SUGAMMADEX SODIUM 200 MG/2ML IV SOLN
INTRAVENOUS | Status: DC | PRN
  Administered 2018-03-28: 400 mg via INTRAVENOUS

## 2018-03-28 MED ORDER — FENTANYL CITRATE (PF) 250 MCG/5ML IJ SOLN
INTRAMUSCULAR | Status: DC | PRN
  Administered 2018-03-28: 150 ug via INTRAVENOUS
  Administered 2018-03-28: 50 ug via INTRAVENOUS

## 2018-03-28 SURGICAL SUPPLY — 84 items
ADH SKN CLS APL DERMABOND .7 (GAUZE/BANDAGES/DRESSINGS) ×2
BAG DECANTER FOR FLEXI CONT (MISCELLANEOUS) ×2 IMPLANT
BINDER BREAST LRG (GAUZE/BANDAGES/DRESSINGS) IMPLANT
BINDER BREAST XLRG (GAUZE/BANDAGES/DRESSINGS) IMPLANT
BLADE HEX COATED 2.75 (ELECTRODE) ×2 IMPLANT
BLADE SURG 10 STRL SS (BLADE) ×4 IMPLANT
BNDG COHESIVE 4X5 TAN STRL (GAUZE/BANDAGES/DRESSINGS) ×2 IMPLANT
BNDG GAUZE ELAST 4 BULKY (GAUZE/BANDAGES/DRESSINGS) ×2 IMPLANT
CANISTER SUCT 3000ML PPV (MISCELLANEOUS) ×2 IMPLANT
CHLORAPREP W/TINT 10.5 ML (MISCELLANEOUS) ×2 IMPLANT
CHLORAPREP W/TINT 26ML (MISCELLANEOUS) ×6 IMPLANT
CLIP VESOCCLUDE LG 6/CT (CLIP) ×4 IMPLANT
CLIP VESOCCLUDE MED 24/CT (CLIP) ×2 IMPLANT
CLIP VESOCCLUDE MED 6/CT (CLIP) ×2 IMPLANT
CLIP VESOCCLUDE SM WIDE 6/CT (CLIP) ×4 IMPLANT
CLOSURE WOUND 1/2 X4 (GAUZE/BANDAGES/DRESSINGS)
COVER MAYO STAND STRL (DRAPES) ×4 IMPLANT
COVER PROBE W GEL 5X96 (DRAPES) ×4 IMPLANT
COVER SURGICAL LIGHT HANDLE (MISCELLANEOUS) ×4 IMPLANT
COVER TRANSDUCER ULTRASND GEL (DRAPE) IMPLANT
COVER WAND RF STERILE (DRAPES) ×2 IMPLANT
CRADLE DONUT ADULT HEAD (MISCELLANEOUS) ×4 IMPLANT
DECANTER SPIKE VIAL GLASS SM (MISCELLANEOUS) ×8 IMPLANT
DERMABOND ADVANCED (GAUZE/BANDAGES/DRESSINGS) ×2
DERMABOND ADVANCED .7 DNX12 (GAUZE/BANDAGES/DRESSINGS) ×2 IMPLANT
DRAPE C-ARM 42X72 X-RAY (DRAPES) ×2 IMPLANT
DRAPE CHEST BREAST 15X10 FENES (DRAPES) ×2 IMPLANT
DRAPE UTILITY XL STRL (DRAPES) ×2 IMPLANT
DRAPE WARM FLUID 44X44 (DRAPE) IMPLANT
DRSG PAD ABDOMINAL 8X10 ST (GAUZE/BANDAGES/DRESSINGS) ×2 IMPLANT
ELECT BLADE 4.0 EZ CLEAN MEGAD (MISCELLANEOUS) ×4
ELECT CAUTERY BLADE 6.4 (BLADE) ×2 IMPLANT
ELECT REM PT RETURN 9FT ADLT (ELECTROSURGICAL) ×4
ELECTRODE BLDE 4.0 EZ CLN MEGD (MISCELLANEOUS) IMPLANT
ELECTRODE REM PT RTRN 9FT ADLT (ELECTROSURGICAL) ×2 IMPLANT
GAUZE 4X4 16PLY RFD (DISPOSABLE) ×2 IMPLANT
GAUZE SPONGE 4X4 12PLY STRL (GAUZE/BANDAGES/DRESSINGS) IMPLANT
GAUZE SPONGE 4X4 12PLY STRL LF (GAUZE/BANDAGES/DRESSINGS) ×2 IMPLANT
GEL ULTRASOUND 20GR AQUASONIC (MISCELLANEOUS) IMPLANT
GLOVE BIO SURGEON STRL SZ 6 (GLOVE) ×6 IMPLANT
GLOVE INDICATOR 6.5 STRL GRN (GLOVE) ×4 IMPLANT
GOWN STRL REUS W/ TWL LRG LVL3 (GOWN DISPOSABLE) ×2 IMPLANT
GOWN STRL REUS W/TWL 2XL LVL3 (GOWN DISPOSABLE) ×4 IMPLANT
GOWN STRL REUS W/TWL LRG LVL3 (GOWN DISPOSABLE) ×4
ILLUMINATOR WAVEGUIDE N/F (MISCELLANEOUS) IMPLANT
KIT BASIN OR (CUSTOM PROCEDURE TRAY) ×4 IMPLANT
KIT MARKER MARGIN INK (KITS) ×2 IMPLANT
KIT TURNOVER KIT B (KITS) ×4 IMPLANT
LIGHT WAVEGUIDE WIDE FLAT (MISCELLANEOUS) ×2 IMPLANT
NDL FILTER BLUNT 18X1 1/2 (NEEDLE) IMPLANT
NDL HYPO 25GX1X1/2 BEV (NEEDLE) ×4 IMPLANT
NDL SAFETY ECLIPSE 18X1.5 (NEEDLE) ×2 IMPLANT
NEEDLE FILTER BLUNT 18X 1/2SAF (NEEDLE)
NEEDLE FILTER BLUNT 18X1 1/2 (NEEDLE) IMPLANT
NEEDLE HYPO 18GX1.5 SHARP (NEEDLE)
NEEDLE HYPO 25GX1X1/2 BEV (NEEDLE) IMPLANT
NS IRRIG 1000ML POUR BTL (IV SOLUTION) ×4 IMPLANT
PACK SURGICAL SETUP 50X90 (CUSTOM PROCEDURE TRAY) ×2 IMPLANT
PACK UNIVERSAL I (CUSTOM PROCEDURE TRAY) ×4 IMPLANT
PAD ARMBOARD 7.5X6 YLW CONV (MISCELLANEOUS) ×4 IMPLANT
PENCIL BUTTON HOLSTER BLD 10FT (ELECTRODE) ×2 IMPLANT
PENCIL SMOKE EVACUATOR (MISCELLANEOUS) ×2 IMPLANT
SLEEVE SUCTION 125 (MISCELLANEOUS) ×2 IMPLANT
SPONGE LAP 18X18 X RAY DECT (DISPOSABLE) ×4 IMPLANT
STOCKINETTE IMPERVIOUS 9X36 MD (GAUZE/BANDAGES/DRESSINGS) ×4 IMPLANT
STRIP CLOSURE SKIN 1/2X4 (GAUZE/BANDAGES/DRESSINGS) ×2 IMPLANT
SUT MNCRL AB 4-0 PS2 18 (SUTURE) ×2 IMPLANT
SUT MON AB 4-0 PC3 18 (SUTURE) ×6 IMPLANT
SUT PROLENE 2 0 SH DA (SUTURE) ×4 IMPLANT
SUT SILK 2 0 SH (SUTURE) ×2 IMPLANT
SUT VIC AB 2-0 SH 27 (SUTURE) ×4
SUT VIC AB 2-0 SH 27XBRD (SUTURE) ×4 IMPLANT
SUT VIC AB 3-0 SH 27 (SUTURE) ×4
SUT VIC AB 3-0 SH 27X BRD (SUTURE) ×4 IMPLANT
SUT VIC AB 3-0 SH 8-18 (SUTURE) ×2 IMPLANT
SYR 5ML LUER SLIP (SYRINGE) ×2 IMPLANT
SYR BULB 3OZ (MISCELLANEOUS) ×4 IMPLANT
SYR CONTROL 10ML LL (SYRINGE) ×4 IMPLANT
TOWEL OR 17X24 6PK STRL BLUE (TOWEL DISPOSABLE) ×4 IMPLANT
TOWEL OR 17X26 10 PK STRL BLUE (TOWEL DISPOSABLE) ×2 IMPLANT
TRAY LAPAROSCOPIC MC (CUSTOM PROCEDURE TRAY) ×4 IMPLANT
TUBE CONNECTING 12'X1/4 (SUCTIONS) ×1
TUBE CONNECTING 12X1/4 (SUCTIONS) ×3 IMPLANT
YANKAUER SUCT BULB TIP NO VENT (SUCTIONS) ×4 IMPLANT

## 2018-03-28 NOTE — Op Note (Signed)
Right Breast lumpectomy and sentinel lymph node biopsy  Indications: This patient presents with history of right breast cancer, cT2N0, lower outer quadrant, grade 3, triple negative.    Pre-operative Diagnosis: right breast cancer  Post-operative Diagnosis: Same  Surgeon: Stark Klein   Anesthesia: General endotracheal anesthesia  ASA Class: 3  Procedure Details  The patient was seen in the Holding Room. The risks, benefits, complications, treatment options, and expected outcomes were discussed with the patient. The possibilities of bleeding, infection, the need for additional procedures, failure to diagnose a condition, and creating a complication requiring transfusion or operation were discussed with the patient. The patient concurred with the proposed plan, giving informed consent.  The site of surgery properly noted/marked. The patient was taken to Operating Room # 9, identified, and the procedure verified as Right Breast Lumpectomy with sentinel lymph node biopsy. A Time Out was held and the above information confirmed.  The right arm, breast, and chest were prepped and draped in standard fashion. The lumpectomy was performed by creating an oblique incision over the lower outer quadrant of the breast.  Dissection was carried down to around the point of maximum signal intensity. The cautery was used to perform the dissection.  Hemostasis was achieved with cautery. The edges of the cavity were marked with large clips, with one each medial, lateral, inferior and superior, and two clips posteriorly.   The specimen was inked with the margin marker paint kit. The wound was irrigated and closed with 3-0 vicryl in layers and 4-0 monocryl subcuticular suture.    Using a hand-held gamma probe, right axillary sentinel nodes were identified transcutaneously.  An oblique incision was created below the axillary hairline.  Dissection was carried through the clavipectoral fascia.  Four deep level two  axillary sentinel nodes were removed.  Counts per second were 3200 at the highest.    The background count was 110 cps.  The wound was irrigated.  Hemostasis was achieved with cautery.  The axillary incision was closed with a 3-0 vicryl deep dermal interrupted sutures and a 4-0 monocryl subcuticular closure.    Sterile dressings were applied. At the end of the operation, all sponge, instrument, and needle counts were correct.  Findings: grossly clear surgical margins.  Axillary tissue dense.    Estimated Blood Loss:  min         Specimens: right breast lumpectomy and four right axillary sentinel lymph nodes.             Complications:  None; patient tolerated the procedure well.         Disposition: PACU - hemodynamically stable.         Condition: stable

## 2018-03-28 NOTE — Progress Notes (Signed)
Patient complaining of chest pain and difficulty breathing.    Dr. Nyoka Cowden was notified at [phone] and gave verbal order for EKG and ISTAT4.   Dr. Barry Dienes paged at 1035 and made aware of patient's chest pain at 1043.     Will continue to monitor patient.

## 2018-03-28 NOTE — Consult Note (Signed)
CARDIOLOGY CONSULT NOTE  Patient ID: Kristina Rivera MRN: [id number] DOB/AGE: 07-21-1960 58 y.o.  Admit date: 03/28/2018 Referring Physician  Finis Bud, MD Primary Physician:  Jonathon Jordan, MD Reason for Consultation  Pre op evaluation and chest pain  HPI: Kristina Rivera  is a 58 y.o. female  With morbid obesity, degenerative joint disease and back pain, hypertension, hyperglycemia, no history of hyperlipidemia, no history of tobacco exposure, no family history of premature coronary artery disease except her sister who is a heavy smoker had coronary stent placed at the age of 71 years, admitted for elective right lumpectomy for breast cancer.  Patient states that she has had history of chest pain and palpitations every time she is exposed to electromagnetic field.  So since being in the hospital, she has had episodes of chest discomfort, described as fluttering feeling in the chest that lasted a second or 2, also has sharp pain in the left upper part of the chest that lasts a few seconds and points to the left upper part of her chest in the parasternal region.  She has mild chronic dyspnea and attributes this to her weight.  States that this has been stable over many years.  She denies any exertional chest pain, exertional palpitations.  Denies PND or orthopnea, no leg edema, no symptoms suggestive of claudication.  Past Medical History:  Diagnosis Date  . Anxiety   . Arthritis    "back; knees; shoulders" (04/19/2013)  . Asthma   . Chronic lower back pain   . Daily headache    "last couple months" (04/19/2013)  . Depression   . Family history of breast cancer   . Family history of lung cancer      Past Surgical History:  Procedure Laterality Date  . CARPAL TUNNEL RELEASE Left 1999?  . TUBAL LIGATION  1993     Family History  Problem Relation Age of Onset  . Breast cancer Maternal Aunt 5  . Pneumonia Mother   . Lung cancer Mother 40  . Stroke Father   . Head & neck cancer  Sister 30       neck  . Prostate cancer Neg Hx      Social History: Social History   Socioeconomic History  . Marital status: Married    Spouse name: Mariea Clonts  . Number of children: 5  . Years of education: Not on file  . Highest education level: Not on file  Occupational History  . Not on file  Social Needs  . Financial resource strain: Not on file  . Food insecurity:    Worry: Not on file    Inability: Not on file  . Transportation needs:    Medical: Not on file    Non-medical: Not on file  Tobacco Use  . Smoking status: Never Smoker  . Smokeless tobacco: Never Used  Substance and Sexual Activity  . Alcohol use: No  . Drug use: No  . Sexual activity: Yes    Birth control/protection: Post-menopausal  Lifestyle  . Physical activity:    Days per week: Not on file    Minutes per session: Not on file  . Stress: Not on file  Relationships  . Social connections:    Talks on phone: Not on file    Gets together: Not on file    Attends religious service: Not on file    Active member of club or organization: Not on file    Attends meetings of clubs or organizations: Not  on file    Relationship status: Not on file  . Intimate partner violence:    Fear of current or ex partner: Not on file    Emotionally abused: Not on file    Physically abused: Not on file    Forced sexual activity: Not on file  Other Topics Concern  . Not on file  Social History Narrative  . Not on file     Medications Prior to Admission  Medication Sig Dispense Refill Last Dose  . acetaminophen (TYLENOL) 500 MG tablet Take 1,000 mg by mouth every 8 (eight) hours as needed for mild pain.    Past Week at Unknown time  . albuterol (VENTOLIN HFA) 108 (90 BASE) MCG/ACT inhaler Inhale 2 puffs into the lungs as needed. (Patient taking differently: Inhale 2 puffs into the lungs every 6 (six) hours as needed for wheezing or shortness of breath. ) 1 Inhaler 6 Past Week at Unknown time  . Multiple  Vitamins-Minerals (MULTIVITAMIN ADULT) TABS Take 1 tablet by mouth daily.   Past Week at Unknown time    Review of Systems  Constitutional: Negative.   HENT: Negative.   Eyes: Negative.   Respiratory: Negative.  Negative for hemoptysis, sputum production and shortness of breath.   Cardiovascular: Positive for chest pain and palpitations. Negative for claudication and leg swelling.  Gastrointestinal: Negative.   Genitourinary: Negative.   Musculoskeletal: Positive for back pain and joint pain.  Skin: Negative.   Neurological: Negative.   Psychiatric/Behavioral: Negative.   All other systems reviewed and are negative.   Physical Exam: Blood pressure (!) 165/88, pulse 93, temperature 98.5 F (36.9 C), temperature source Oral, height 5\' 2"  (1.575 m), weight (!) 140.3 kg, SpO2 95 %. Body mass index is 56.55 kg/m.  Physical Exam  Constitutional: She is oriented to person, place, and time. She appears well-developed and well-nourished. No distress.  Morbidly obese   HENT:  Head: Atraumatic.  Eyes: Conjunctivae are normal.  Neck: Neck supple. No JVD present. No thyromegaly present.  Cardiovascular: Normal rate, regular rhythm, normal heart sounds and intact distal pulses. Exam reveals no gallop.  No murmur heard. Pulmonary/Chest: Effort normal and breath sounds normal.  Abdominal: Soft. Bowel sounds are normal.  Musculoskeletal: Normal range of motion.        General: No edema.  Neurological: She is alert and oriented to person, place, and time.  Skin: Skin is warm and dry.  Psychiatric: She has a normal mood and affect.   Labs:  BNP (last 3 results) No results for input(s): BNP in the last 8760 hours.  CMP Latest Ref Rng & Units 03/28/2018 03/27/2018 03/21/2018  Glucose 70 - 99 mg/dL 106(H) 102(H) 138(H)  BUN 6 - 20 mg/dL - 9 10  Creatinine 0.44 - 1.00 mg/dL - 0.77 0.93  Sodium 135 - 145 mmol/L 142 140 140  Potassium 3.5 - 5.1 mmol/L 3.8 4.1 4.4  Chloride 98 - 111 mmol/L - 104  102  CO2 22 - 32 mmol/L - 27 30  Calcium 8.9 - 10.3 mg/dL - 9.3 9.6  Total Protein 6.5 - 8.1 g/dL - - 8.2(H)  Total Bilirubin 0.3 - 1.2 mg/dL - - 0.6  Alkaline Phos 38 - 126 U/L - - 76  AST 15 - 41 U/L - - 24  ALT 0 - 44 U/L - - 21     CBC Latest Ref Rng & Units 03/28/2018 03/27/2018 03/21/2018  WBC 4.0 - 10.5 K/uL - 8.7 7.1  Hemoglobin 12.0 -  15.0 g/dL 13.6 12.2 12.8  Hematocrit 36.0 - 46.0 % 40.0 42.6 43.1  Platelets 150 - 400 K/uL - 285 310    Lipid Panel     Component Value Date/Time   CHOL 155 04/19/2013 2218   TRIG 103 04/19/2013 2218   HDL 52 04/19/2013 2218   CHOLHDL 3.0 04/19/2013 2218   VLDL 21 04/19/2013 2218   LDLCALC 82 04/19/2013 2218   LDLDIRECT 95 02/15/2011 1128   HEMOGLOBIN A1C Lab Results  Component Value Date   HGBA1C 6.7 (H) 04/19/2013   MPG 146 (H) 04/19/2013   Radiology: No results found.  Scheduled Meds: . acetaminophen      . acetaminophen  1,000 mg Oral On Call to OR  . Chlorhexidine Gluconate Cloth  6 each Topical Once   And  . Chlorhexidine Gluconate Cloth  6 each Topical Once  . gabapentin      . gabapentin  300 mg Oral On Call to OR   Continuous Infusions: .  ceFAZolin (ANCEF) IV    . lactated ringers 10 mL/hr at 03/28/18 1057   PRN Meds:.  CARDIAC STUDIES:  EKG: 03/28/2018: Normal sinus rhythm At the rate of 90 bpm, leftward axis, incomplete right bundle branch block.  PVC.  ASSESSMENT AND PLAN:  1.  Atypical chest pain, mostly musculoskeletal, sharp pain in the left upper parasternal region, she also has mild tenderness in the left third costochondral junction.  Chest pain episode last a few seconds, states that she has got electromagnetic sensitivity with palpitations and chest pain every time she is exposed to high electromagnetic field. 2.  Hypertension 3.  Palpitations, suggest PACs and PVCs.  1 PVC noted on EKG. 4.  Hyperglycemia/prediabetes mellitus 5. Preoperative cardiac evaluation.  Recommendation: Patient is low risk  for upcoming right lumpectomy.  Systolic blood pressure was slightly elevated, per patient's history blood pressure has been well controlled and she is extremely anxious.  Chest pain symptoms more indicative of costochondritis, she has chronic palpitations and similar chest pain episodes ongoing for the past 5 to 6 years.  A week ago she went shopping and walked around shopping mall for about an hour without having any chest discomfort or palpitations.  She also walked a flight of stairs in her daughter's home yesterday without any significant chest pain or shortness of breath.  Physical examination does not reveal any evidence of heart failure.  EKG is essentially revealing left axis deviation, probably related to hypertension.  Previously noted T wave inversion in 2015 are not present.  Adrian Prows, MD, [facility] 03/28/2018, 12:28 PM [facility] [place] Pager: [phone] Office: [phone] If no answer Cell [phone]

## 2018-03-28 NOTE — Interval H&P Note (Signed)
History and Physical Interval Note:  03/28/2018 11:50 AM  Kristina Rivera  has presented today for surgery, with the diagnosis of RIGHT BREAST CANCER  The various methods of treatment have been discussed with the patient and family. After consideration of risks, benefits and other options for treatment, the patient has consented to  Procedure(s): RIGHT BREAST LUMPECTOMY WITH RIGHT SENTINEL LYMPH NODE BIOPSY (Right) INSERTION PORT-A-CATH (N/A) as a surgical intervention .  The patient's history has been reviewed, patient examined, no change in status, stable for surgery.  I have reviewed the patient's chart and labs.  Questions were answered to the patient's satisfaction.     Stark Klein

## 2018-03-28 NOTE — H&P (Signed)
Sharyne Peach Appointment: 03/21/2018 1:00 PM Location: [facility] Patient #: [id number] DOB: June 22, 1960 Undefined / Language: Cleophus Molt / Race: Black or African American Female   History of Present Illness Stark Klein MD; 03/21/2018 3:38 PM) The patient is a 58 year old female who presents with breast cancer. Pt is a 58 yo F who is referred for consultation by Dr. Joneen Caraway for new diagnosis of screening detected right breast cancer. She was found to have a 3 cm mass at 8 o'clock. She had diagnostic imaging confirming this and had a core needle biopsy. Biopsy showed grade carcinoma wtih necrosis, triple negative. A suspicious node was biopsied and was negative. A left sided breast biopsy was negative.   She has family history for breast cancer in her maternal aunt. Her mother had lung cancer and sister had neck cancer. She had menarche in 2014. she had menopause at age 13. She is a G5P5 She has had a colonoscopy in the past as well as a bone density. Last pap smear was 2018.   She has electromagnetic sensitivity syndrome. This is quite limiting to her. She also has anxiety and asthma.   bilateral dx mammogram/us 03/01/2018  ACR Breast Density Category c: The breast tissue is heterogeneously dense, which may obscure small masses.  FINDINGS: In the right breast, the possible mass persists. It has partly lobulated and partly indistinct margins. It lies in the lower outer quadrant of the right breast. No other right breast masses. No architectural distortion. No suspicious calcifications.  In the left breast, the possible mass persists as an oval mostly circumscribed retroareolar mass. No other left breast masses, no architectural distortion and no suspicious calcifications.  Mammographic images were processed with CAD.  On physical exam, there is a palpable, firm, somewhat tender lump along the 8 o'clock position of the right breast. No palpable retroareolar left  breast masses.  Targeted right breast ultrasound is performed, showing a hypoechoic mass with irregular and partly ill-defined margins in the right breast at 8 o'clock, 7 cm the nipple, measuring 3.0 x 1.6 x 2.4 cm. It has internal vascularity on color Doppler analysis.  Sonographic evaluation of the right axilla shows several mildly prominent lymph nodes, 2 with cortical thickness is of 4 mm. All nodes have normal overall shapes with hilar fat.  Targeted left breast ultrasound is performed, showing an oval circumscribed cyst in the 1 o'clock position of the left breast, 2 cm the nipple, measuring 6 x 3 x 4 mm. This is consistent in size, shape and location to the mammographic finding.  Also on the left in the retroareolar region is an irregular focal hypoechogenic area with posterior acoustic shadowing. This lies at 2 o'clock, 1 cm from the nipple measuring approximately 6 x 4 x 5 mm.  Sonographic evaluation of the left axilla shows no enlarged or abnormal lymph nodes.  IMPRESSION: 1. 3 cm mass in the 8 o'clock position of the right breast highly suspicious for breast malignancy. 2. Two borderline abnormal right axillary lymph nodes with cortical thicknesses of 4 mm. 3. Indeterminate mass or lesion in the 2 o'clock retroareolar region of the left breast measuring 6 mm. No left axillary adenopathy.  RECOMMENDATION: 1. Ultrasound-guided core needle biopsy of the 8 o'clock position, 3 cm right breast mass. 2. Ultrasound-guided core needle biopsy of 1 of the 2 borderline abnormal right axillary lymph nodes. 3. Ultrasound-guided core needle biopsy of the 6 mm, 2 o'clock position retroareolar left breast lesion.  I have  discussed the findings and recommendations with the patient. Results were also provided in writing at the conclusion of the visit. If applicable, a reminder letter will be sent to the patient regarding the next appointment.  BI-RADS CATEGORY 5: Highly  suggestive of malignancy.  pathology 03/09/2018 Diagnosis 1. Breast, right, needle core biopsy, 8 o'clock - HIGH GRADE CARCINOMA WITH NECROSIS. SEE NOTE. 2. Lymph node, needle/core biopsy, right axilla - BENIGN LYMPH NODE. 3. Breast, left, needle core biopsy, 2 o'clock retroareolar - BENIGN BREAST PARENCHYMA WITH FIBROCYSTIC CHANGE AND USUAL DUCTAL HYPERPLASIA. SEE NOTE. - NEGATIVE FOR CARCINOMA.  Estrogen Receptor: 0%, NEGATIVE Progesterone Receptor: 0%, NEGATIVE Proliferation Marker Ki67: 80% GROUP 4: HER2 **NEGATIVE** Equivocal form of amplification of the HER2  CBC, CMET 03/21/2018 essentially normal   Past Surgical History Stark Klein, MD; 03/20/2018 3:54 PM) Breast Biopsy  Bilateral.  Diagnostic Studies History Stark Klein, MD; 03/20/2018 3:54 PM) Colonoscopy  1-5 years ago Mammogram  within last year Pap Smear  1-5 years ago  Social History Stark Klein, MD; 03/20/2018 3:54 PM) Caffeine use  Coffee. No alcohol use  No drug use  Tobacco use  Never smoker.  Family History Stark Klein, MD; 03/20/2018 3:54 PM) Bleeding disorder  Mother. Cerebrovascular Accident  Father. Diabetes Mellitus  Father. Heart Disease  Father. Hypertension  Father, Mother. Respiratory Condition  Mother.  Pregnancy / Birth History Stark Klein, MD; 03/20/2018 3:54 PM) Age at menarche  11 years. Age of menopause  67-50 Gravida  5 Irregular periods  Maternal age  21-20 Para  69  Other Problems Stark Klein, MD; 03/20/2018 3:54 PM) Arthritis  Asthma  Back Pain  Breast Cancer  General anesthesia - complications  Heart murmur  Lump In Breast  Sickle cell disease     Review of Systems Stark Klein MD; 03/20/2018 3:54 PM) General Not Present- Appetite Loss, Chills, Fatigue, Fever, Night Sweats, Weight Gain and Weight Loss. Skin Not Present- Change in Wart/Mole, Dryness, Hives, Jaundice, New Lesions, Non-Healing Wounds, Rash and Ulcer. HEENT Present- Seasonal  Allergies and Wears glasses/contact lenses. Not Present- Earache, Hearing Loss, Hoarseness, Nose Bleed, Oral Ulcers, Ringing in the Ears, Sinus Pain, Sore Throat, Visual Disturbances and Yellow Eyes. Respiratory Present- Wheezing. Not Present- Bloody sputum, Chronic Cough, Difficulty Breathing and Snoring. Breast Present- Breast Mass, Breast Pain and Skin Changes. Not Present- Nipple Discharge. Cardiovascular Not Present- Chest Pain, Difficulty Breathing Lying Down, Leg Cramps, Palpitations, Rapid Heart Rate, Shortness of Breath and Swelling of Extremities. Gastrointestinal Not Present- Abdominal Pain, Bloating, Bloody Stool, Change in Bowel Habits, Chronic diarrhea, Constipation, Difficulty Swallowing, Excessive gas, Gets full quickly at meals, Hemorrhoids, Indigestion, Nausea, Rectal Pain and Vomiting. Female Genitourinary Not Present- Frequency, Nocturia, Painful Urination, Pelvic Pain and Urgency. Musculoskeletal Not Present- Back Pain, Joint Pain, Joint Stiffness, Muscle Pain, Muscle Weakness and Swelling of Extremities. Neurological Present- Headaches. Not Present- Decreased Memory, Fainting, Numbness, Seizures, Tingling, Tremor, Trouble walking and Weakness. Psychiatric Not Present- Anxiety, Bipolar, Change in Sleep Pattern, Depression, Fearful and Frequent crying. Endocrine Not Present- Cold Intolerance, Excessive Hunger, Hair Changes, Heat Intolerance, Hot flashes and New Diabetes. Hematology Not Present- Blood Thinners, Easy Bruising, Excessive bleeding, Gland problems, HIV and Persistent Infections.  Vitals Stark Klein MD; 03/21/2018 2:41 PM) 03/21/2018 2:40 PM Weight: 307.8 lb Height: 62in Body Surface Area: 2.3 m Body Mass Index: 56.3 kg/m  Temp.: 30F  Pulse: 89 (Regular)  Resp.: 18 (Unlabored)  BP: 149/79 (Sitting, Left Arm, Standard)       Physical Exam Stark Klein MD;  03/21/2018 3:40 PM) General Mental Status-Alert. General Appearance-Consistent with  stated age. Hydration-Well hydrated. Voice-Normal.  Head and Neck Head-normocephalic, atraumatic with no lesions or palpable masses. Note: wears scarf wtih nickel to help her wtih ambient electromagnetic energy. Trachea-midline. Thyroid Gland Characteristics - normal size and consistency.  Eye Eyeball - Bilateral-Extraocular movements intact. Sclera/Conjunctiva - Bilateral-No scleral icterus.  Chest and Lung Exam Chest and lung exam reveals -quiet, even and easy respiratory effort with no use of accessory muscles and on auscultation, normal breath sounds, no adventitious sounds and normal vocal resonance. Inspection Chest Wall - Normal. Back - normal.  Breast Note: breasts are relatively symmetric. palpable mass at 8 o'clock around 3-4 cm in diameter. no palpable LAD. ptosis bilaterally. no nipple retraction or nipple discharge.   Cardiovascular Cardiovascular examination reveals -normal heart sounds, regular rate and rhythm with no murmurs and normal pedal pulses bilaterally.  Abdomen Inspection Inspection of the abdomen reveals - No Hernias. Palpation/Percussion Palpation and Percussion of the abdomen reveal - Soft, Non Tender, No Rebound tenderness, No Rigidity (guarding) and No hepatosplenomegaly. Auscultation Auscultation of the abdomen reveals - Bowel sounds normal.  Neurologic Neurologic evaluation reveals -alert and oriented x 3 with no impairment of recent or remote memory. Mental Status-Normal.  Musculoskeletal Global Assessment -Note: no gross deformities.  Normal Exam - Left-Upper Extremity Strength Normal and Lower Extremity Strength Normal. Normal Exam - Right-Upper Extremity Strength Normal and Lower Extremity Strength Normal.  Lymphatic Head & Neck  General Head & Neck Lymphatics: Bilateral - Description - Normal. Axillary  General Axillary Region: Bilateral - Description - Normal. Tenderness - Non Tender. Femoral &  Inguinal  Generalized Femoral & Inguinal Lymphatics: Bilateral - Description - No Generalized lymphadenopathy.    Assessment & Plan Stark Klein MD; 03/21/2018 3:56 PM) MALIGNANT NEOPLASM OF LOWER-OUTER QUADRANT OF RIGHT BREAST OF FEMALE, ESTROGEN RECEPTOR NEGATIVE (C50.511) Impression: Pt has a new diagnosis of right breast cancer, triple negative. Discussed pros and cons of neoadjuvant chemo. We decided on up front surgery. Will schedule open MRI. Will also get genetics due to triple negative nature of tumor and family history.  She does not need a seed since her tumor is palpable. As long as we have no surprises wtih MRI or genetics, will plan lumpectomy, sentinel lymph node biopsy, and port. This will be followed by chemotherapy and radiation.  I discussed risks of surgery with the patient. The surgical procedure was described to the patient. I discussed the incision type and location and that we would need radiology involved on with a wire or seed marker and/or sentinel node.  The risks and benefits of the procedure were described to the patient and she wishes to proceed.  We discussed the risks bleeding, infection, damage to other structures, need for further procedures/surgeries. We discussed the risk of seroma. The patient was advised if the area in the breast in cancer, we may need to go back to surgery for additional tissue to obtain negative margins or for a lymph node biopsy. The patient was advised that these are the most common complications, but that others can occur as well. They were advised against taking aspirin or other anti-inflammatory agents/blood thinners the week before surgery. Current Plans Pt Education - ccs port insertion education MR BREAST BILAT W CON (16109) Referred to Genetic Counseling, for evaluation and follow up (Medical Genetics). Routine. NOT CURRENTLY WORKING DUE TO DISABLED STATUS (Z56.0) Impression: This is for back injury. pt is prior nurses  aide.  She also has  electromagnetic sensitivity syndrome which impairs her ability to be in areas of high amounts of energy.    Signed by Stark Klein, MD (03/21/2018 3:57 PM)

## 2018-03-28 NOTE — Transfer of Care (Signed)
Immediate Anesthesia Transfer of Care Note  Patient: Kristina Rivera  Procedure(s) Performed: RIGHT BREAST LUMPECTOMY WITH RIGHT SENTINEL LYMPH NODE BIOPSY (Right Breast)  Patient Location: PACU  Anesthesia Type:General  Level of Consciousness: awake, alert  and patient cooperative  Airway & Oxygen Therapy: Patient Spontanous Breathing and Patient connected to nasal cannula oxygen  Post-op Assessment: Report given to RN and Post -op Vital signs reviewed and stable  Post vital signs: Reviewed and stable  Last Vitals:  Vitals Value Taken Time  BP 134/78 03/28/2018  2:48 PM  Temp 36.4 C 03/28/2018  2:47 PM  Pulse 80 03/28/2018  2:49 PM  Resp 15 03/28/2018  2:49 PM  SpO2 97 % 03/28/2018  2:49 PM  Vitals shown include unvalidated device data.  Last Pain:  Vitals:   03/28/18 1447  TempSrc:   PainSc: (P) Asleep      Patients Stated Pain Goal: 3 (54/49/20 1007)  Complications: No apparent anesthesia complications

## 2018-03-28 NOTE — Anesthesia Preprocedure Evaluation (Addendum)
Anesthesia Evaluation  Patient identified by MRN, date of birth, ID band Patient awake    Reviewed: Allergy & Precautions, NPO status , Patient's Chart, lab work & pertinent test results  Airway Mallampati: II  TM Distance: >3 FB     Dental   Pulmonary asthma ,    breath sounds clear to auscultation       Cardiovascular  Rhythm:Regular Rate:Normal  Dr. Rockwell Alexandria evaluated patient for cardiac history and electromagnetic sensitivity with Chest pain resolved Belinda Block, MD   Neuro/Psych  Headaches,    GI/Hepatic negative GI ROS, Neg liver ROS,   Endo/Other    Renal/GU negative Renal ROS     Musculoskeletal   Abdominal   Peds  Hematology   Anesthesia Other Findings   Reproductive/Obstetrics                             Anesthesia Physical Anesthesia Plan  ASA: III  Anesthesia Plan: General   Post-op Pain Management:    Induction: Intravenous  PONV Risk Score and Plan: Treatment may vary due to age or medical condition  Airway Management Planned: Oral ETT  Additional Equipment:   Intra-op Plan:   Post-operative Plan: Possible Post-op intubation/ventilation  Informed Consent: I have reviewed the patients History and Physical, chart, labs and discussed the procedure including the risks, benefits and alternatives for the proposed anesthesia with the patient or authorized representative who has indicated his/her understanding and acceptance.     Dental advisory given  Plan Discussed with:   Anesthesia Plan Comments:         Anesthesia Quick Evaluation

## 2018-03-28 NOTE — Discharge Instructions (Addendum)
[facility] °Office Phone Number [phone] ° °BREAST BIOPSY/ PARTIAL MASTECTOMY: POST OP INSTRUCTIONS ° °Always review your discharge instruction sheet given to you by the facility where your surgery was performed. ° °IF YOU HAVE DISABILITY OR FAMILY LEAVE FORMS, YOU MUST BRING THEM TO THE OFFICE FOR PROCESSING.  DO NOT GIVE THEM TO YOUR DOCTOR. ° °1. A prescription for pain medication may be given to you upon discharge.  Take your pain medication as prescribed, if needed.  If narcotic pain medicine is not needed, then you may take acetaminophen (Tylenol) or ibuprofen (Advil) as needed. °2. Take your usually prescribed medications unless otherwise directed °3. If you need a refill on your pain medication, please contact your pharmacy.  They will contact our office to request authorization.  Prescriptions will not be filled after 5pm or on week-ends. °4. You should eat very light the first 24 hours after surgery, such as soup, crackers, pudding, etc.  Resume your normal diet the day after surgery. °5. Most patients will experience some swelling and bruising in the breast.  Ice packs and a good support bra will help.  Swelling and bruising can take several days to resolve.  °6. It is common to experience some constipation if taking pain medication after surgery.  Increasing fluid intake and taking a stool softener will usually help or prevent this problem from occurring.  A mild laxative (Milk of Magnesia or Miralax) should be taken according to package directions if there are no bowel movements after 48 hours. °7. Unless discharge instructions indicate otherwise, you may remove your bandages 48 hours after surgery, and you may shower at that time.  You may have steri-strips (small skin tapes) in place directly over the incision.  These strips should be left on the skin for 7-10 days.   Any sutures or staples will be removed at the office during your follow-up visit. °8. ACTIVITIES:  You may resume  regular daily activities (gradually increasing) beginning the next day.  Wearing a good support bra or sports bra (or the breast binder) minimizes pain and swelling.  You may have sexual intercourse when it is comfortable. °a. You may drive when you no longer are taking prescription pain medication, you can comfortably wear a seatbelt, and you can safely maneuver your car and apply brakes. °b. RETURN TO WORK:  __________1 week_______________ °9. You should see your doctor in the office for a follow-up appointment approximately two weeks after your surgery.  Your doctor’s nurse will typically make your follow-up appointment when she calls you with your pathology report.  Expect your pathology report 2-3 business days after your surgery.  You may call to check if you do not hear from us after three days. ° ° °WHEN TO CALL YOUR DOCTOR: °1. Fever over 101.0 °2. Nausea and/or vomiting. °3. Extreme swelling or bruising. °4. Continued bleeding from incision. °5. Increased pain, redness, or drainage from the incision. ° °The clinic staff is available to answer your questions during regular business hours.  Please don’t hesitate to call and ask to speak to one of the nurses for clinical concerns.  If you have a medical emergency, go to the nearest emergency room or call 911.  A surgeon from [facility] is always on call at the hospital. ° °For further questions, please visit [email or web address]  ° °

## 2018-03-28 NOTE — Anesthesia Procedure Notes (Signed)
Procedure Name: Intubation Date/Time: 03/28/2018 12:54 PM Performed by: Elayne Snare, CRNA Pre-anesthesia Checklist: Patient identified, Emergency Drugs available, Suction available and Patient being monitored Patient Re-evaluated:Patient Re-evaluated prior to induction Oxygen Delivery Method: Circle System Utilized Preoxygenation: Pre-oxygenation with 100% oxygen Induction Type: IV induction and Rapid sequence Ventilation: Mask ventilation without difficulty Laryngoscope Size: Mac and 3 Grade View: Grade I Tube type: Oral Tube size: 7.0 mm Number of attempts: 1 Airway Equipment and Method: Stylet Placement Confirmation: ETT inserted through vocal cords under direct vision,  positive ETCO2 and breath sounds checked- equal and bilateral Secured at: 21 cm Tube secured with: Tape Dental Injury: Teeth and Oropharynx as per pre-operative assessment

## 2018-03-29 ENCOUNTER — Encounter (HOSPITAL_COMMUNITY): Payer: Self-pay | Admitting: General Surgery

## 2018-03-29 ENCOUNTER — Telehealth: Payer: Self-pay | Admitting: Genetics

## 2018-03-29 ENCOUNTER — Telehealth: Payer: Self-pay | Admitting: *Deleted

## 2018-03-29 ENCOUNTER — Encounter: Payer: Self-pay | Admitting: *Deleted

## 2018-03-29 NOTE — Telephone Encounter (Signed)
Spoke to pt concerning [facility] from 03/21/18. Denies questions or concerns regarding dx or treatment care plan. Pt relate some soreness from breast surgery on 03/28/18. Confirmed future appts. Denies further questions or needs at this time.

## 2018-03-30 ENCOUNTER — Ambulatory Visit: Payer: Self-pay | Admitting: Genetics

## 2018-03-30 ENCOUNTER — Encounter: Payer: Self-pay | Admitting: Genetics

## 2018-03-30 DIAGNOSIS — Z171 Estrogen receptor negative status [ER-]: Principal | ICD-10-CM

## 2018-03-30 DIAGNOSIS — Z801 Family history of malignant neoplasm of trachea, bronchus and lung: Secondary | ICD-10-CM

## 2018-03-30 DIAGNOSIS — C50511 Malignant neoplasm of lower-outer quadrant of right female breast: Secondary | ICD-10-CM

## 2018-03-30 DIAGNOSIS — Z1379 Encounter for other screening for genetic and chromosomal anomalies: Secondary | ICD-10-CM

## 2018-03-30 DIAGNOSIS — Z803 Family history of malignant neoplasm of breast: Secondary | ICD-10-CM

## 2018-03-30 NOTE — Telephone Encounter (Signed)
Revealed negative genetic testing.  Revealed that a VUS in ATM was identified.   This normal result is reassuring and indicates that it is unlikely Kristina Rivera's cancer is due to a hereditary cause.  It is unlikely that there is an increased risk of another cancer due to a mutation in one of these genes.  However, genetic testing is not perfect, and cannot definitively rule out a hereditary cause.  It will be important for her to keep in contact with genetics to learn if any additional testing may be needed in the future.     We recommended her maternal aunt with breast cancer at 40/her family also consider genetic testing.  There could still be a genetic cause for her cancer that Kristina Rivera simply did not inherit and therefore was not found in her.   Kristina Rivera should continue to follow her healthcare providers' recommendations regarding cancer screening and management.

## 2018-03-30 NOTE — Anesthesia Postprocedure Evaluation (Signed)
Anesthesia Post Note  Patient: Kristina Rivera  Procedure(s) Performed: RIGHT BREAST LUMPECTOMY WITH RIGHT SENTINEL LYMPH NODE BIOPSY (Right Breast)     Patient location during evaluation: PACU Anesthesia Type: General Level of consciousness: awake Pain management: pain level controlled Vital Signs Assessment: post-procedure vital signs reviewed and stable Respiratory status: spontaneous breathing Cardiovascular status: stable Postop Assessment: no apparent nausea or vomiting Anesthetic complications: no    Last Vitals:  Vitals:   03/28/18 1515 03/28/18 1536  BP:  109/70  Pulse:  84  Resp:  18  Temp:    SpO2: 100% 99%    Last Pain:  Vitals:   03/28/18 1536  TempSrc:   PainSc: 0-No pain                 [name]

## 2018-03-30 NOTE — Progress Notes (Signed)
HPI:  Kristina Rivera was previously seen in the [facility] on 03/21/2018 due to a personal and family history of breast cancer and concerns regarding a hereditary predisposition to cancer. Please refer to our prior cancer genetics clinic note for more information regarding Kristina Rivera's medical, social and family histories, and our assessment and recommendations, at the time. Kristina Rivera recent genetic test results were disclosed to her, as well as recommendations warranted by these results. These results and recommendations are discussed in more detail below.  CANCER HISTORY:    Malignant neoplasm of lower-outer quadrant of right breast of female, estrogen receptor negative ([facility])   03/15/2018 Initial Diagnosis    Malignant neoplasm of lower-outer quadrant of right breast of female, estrogen receptor negative ([facility])    04/03/2018 -  Chemotherapy    The patient had DOXOrubicin (ADRIAMYCIN) chemo injection 148 mg, 60 mg/m2, Intravenous,  Once, 0 of 4 cycles PALONOSETRON HCL INJECTION 0.25 MG/5ML, 0.25 mg, Intravenous,  Once, 0 of 8 cycles pegfilgrastim-cbqv (UDENYCA) injection 6 mg, 6 mg, Subcutaneous, Once, 0 of 4 cycles CARBOplatin (PARAPLATIN) in sodium chloride 0.9 % 100 mL chemo infusion, , Intravenous,  Once, 0 of 4 cycles cyclophosphamide (CYTOXAN) 1,480 mg in sodium chloride 0.9 % 250 mL chemo infusion, 600 mg/m2, Intravenous,  Once, 0 of 4 cycles PACLitaxel (TAXOL) 198 mg in sodium chloride 0.9 % 250 mL chemo infusion (</= '80mg'$ /m2), 80 mg/m2, Intravenous,  Once, 0 of 4 cycles FOSAPREPITANT '150MG'$  + DEXAMETHASONE INFUSION [facility], , Intravenous,  Once, 0 of 8 cycles  for chemotherapy treatment.       FAMILY HISTORY:  We obtained a detailed, 4-generation family history.  Significant diagnoses are listed below: Family History  Problem Relation Age of Onset  . Breast cancer Maternal Aunt 51  . Pneumonia Mother   . Lung cancer Mother 9  . Stroke Father   . Head & neck cancer  Sister 30       neck  . Prostate cancer Neg Hx     Kristina Rivera has 3 daughters and 2 sons ages 22-40's with no hx of cance.r  She has 3 full sisters and 3 full brothers with no hx of cancer.  She also has 2 paternal half-sisters and 1 paternal half-brother. 1 half-sister had neck cancer dx I her 30's-she had a hx of smoking.   Kristina Rivera father: died at 2 due to stroke.  Paternal aunts/Uncles: 8 paternal aunts/uncles- no hx of cancer reported I these relatives. Paternal cousins: no known hs of cancer.  Paternal grandfather: unk Paternal grandmother:unk  Kristina Rivera's mother: died at 86.  She had pneumonia and lung cancer.  Maternal Aunts/Uncles: 4 maternal aunts/uncles. 1 aunt was dx with breast cancer at 38.  Maternal cousins: no kn Maternal grandfather: unk Maternal grandmother:died at 7 with TB.  Kristina Rivera is unaware of previous family history of genetic testing for hereditary cancer risks. Patient's ancestry through commercial ancestry testing was: African, 25% Ashkenazi [place], [place]. There is no known consanguinity.  GENETIC TEST RESULTS: Genetic testing performed through [facility]'s Common Hereditary Cancers Panel + EGFR reported out on 03/28/2018 showed no pathogenic mutations.   The Common Hereditary Cancer Panel offered by [facility] includes sequencing and/or deletion duplication testing of the following 53 genes: APC, ATM, AXIN2, BARD1, BMPR1A, BRCA1, BRCA2, BRIP1, BUB1B, CDH1, CDK4, CDKN2A, CHEK2, CTNNA1, DICER1, ENG, EPCAM, GALNT12, GREM1, HOXB13, KIT, MEN1, MLH1, MLH3, MSH2, MSH3, MSH6, MUTYH, NBN, NF1, NTHL1, PALB2, PDGFRA, PMS2, POLD1, POLE, PTEN,  RAD50, RAD51C, RAD51D, RNF43, RPS20, SDHA, SDHB, SDHC, SDHD, SMAD4, SMARCA4, STK11, TP53, TSC1, TSC2, VHL  A variant of uncertain significance (VUS) in a gene called ATM was also noted. [id number] ([id number]).  The test report will be scanned into EPIC and will be located under the Molecular Pathology section of the Results  Review tab. A portion of the result report is included below for reference.     We discussed with Kristina Rivera that because current genetic testing is not perfect, it is possible there may be a gene mutation in one of these genes that current testing cannot detect, but that chance is small.  We also discussed, that there could be another gene that has not yet been discovered, or that we have not yet tested, that is responsible for the cancer diagnoses in the family. It is also possible there is a hereditary cause for the cancer in the family that Kristina Rivera did not inherit and therefore was not identified in her testing.  Therefore, it is important to remain in touch with cancer genetics in the future so that we can continue to offer Kristina Rivera the most up to date genetic testing.   Regarding the VUS in ATM: At this time, it is unknown if this variant is associated with increased cancer risk or if this is a normal finding, but most variants such as this get reclassified to being inconsequential. It should not be used to make medical management decisions. With time, we suspect the lab will determine the significance of this variant, if any. If we do learn more about it, we will try to contact Kristina Rivera to discuss it further. However, it is important to stay in touch with [place] periodically and keep the address and phone number up to date.  ADDITIONAL GENETIC TESTING: We discussed with Kristina Rivera that there are other genes that are associated with increased cancer risk that can be analyzed. The laboratories that offer this testing look at these additional genes via a hereditary cancer gene panel. Should Kristina Rivera wish to pursue additional genetic testing, we are happy to discuss and coordinate this testing, at any time.    CANCER SCREENING RECOMMENDATIONS: Kristina Rivera test result is considered negative (normal).  This means that we have not identified a hereditary cause for her personal and family history of cancer  at this time.   While reassuring, this does not definitively rule out a hereditary predisposition to cancer. It is still possible that there could be genetic mutations that are undetectable by current technology, or genetic mutations in genes that have not been tested or identified to increase cancer risk.  Therefore, it is recommended she continue to follow the cancer management and screening guidelines provided by her oncology and primary healthcare provider. An individual's cancer risk is not determined by genetic test results alone.  Overall cancer risk assessment includes additional factors such as personal medical history, family history, etc.  These should be used to make a personalized plan for cancer prevention and surveillance.    RECOMMENDATIONS FOR FAMILY MEMBERS:  Relatives in this family might be at some increased risk of developing cancer, over the general population risk, simply due to the family history of cancer.  We recommended women in this family have a yearly mammogram beginning at age 9, or 92 years younger than the earliest onset of cancer, an annual clinical breast exam, and perform monthly breast self-exams. Women in this family should also have a gynecological exam as recommended  by their primary provider. All family members should have a colonoscopy as directed by their doctors.  All family members should inform their physicians about the family history of cancer so their doctors can make the most appropriate screening recommendations for them.   It is also possible there is a hereditary cause for the cancer in Kristina Rivera's family that she did not inherit and therefore was not identified in her .  We recommended her maternal aunt with breast cancer dx at 26 or this aunt's close relatives also have  have genetic counseling and testing. Kristina Rivera will let us know if we can be of any assistance in coordinating genetic counseling and/or testing for these family members.   FOLLOW-UP:  Lastly, we discussed with Kristina Rivera that cancer genetics is a rapidly advancing field and it is possible that new genetic tests will be appropriate for her and/or her family members in the future. We encouraged her to remain in contact with cancer genetics on an annual basis so we can update her personal and family histories and let her know of advances in cancer genetics that may benefit this family.   Our contact number was provided. Kristina Rivera questions were answered to her satisfaction, and she knows she is welcome to call us at anytime with additional questions or concerns.   Ferol Luz, MS, [facility] Certified Genetic Counselor [email or web address]

## 2018-04-02 NOTE — Progress Notes (Signed)
Please let patient know margins and lymph nodes are negative.

## 2018-04-04 ENCOUNTER — Ambulatory Visit (HOSPITAL_COMMUNITY)
Admission: RE | Admit: 2018-04-04 | Discharge: 2018-04-04 | Disposition: A | Discharge status: Home / Self Care | Payer: Medicare HMO | Source: Ambulatory Visit | Attending: Oncology | Admitting: Oncology

## 2018-04-04 DIAGNOSIS — I517 Cardiomegaly: Secondary | ICD-10-CM | POA: Diagnosis not present

## 2018-04-04 DIAGNOSIS — Z171 Estrogen receptor negative status [ER-]: Secondary | ICD-10-CM | POA: Diagnosis not present

## 2018-04-04 DIAGNOSIS — C50511 Malignant neoplasm of lower-outer quadrant of right female breast: Secondary | ICD-10-CM

## 2018-04-04 NOTE — Progress Notes (Signed)
  Echocardiogram 2D Echocardiogram has been performed.  Kristina Rivera 04/04/2018, 12:32 PM

## 2018-04-11 ENCOUNTER — Encounter: Payer: Self-pay | Admitting: Oncology

## 2018-04-11 ENCOUNTER — Inpatient Hospital Stay: Payer: Medicare HMO

## 2018-04-11 DIAGNOSIS — Z0189 Encounter for other specified special examinations: Secondary | ICD-10-CM | POA: Diagnosis not present

## 2018-04-11 NOTE — Progress Notes (Signed)
Went to waiting area to have patient sign her grant approval for [employer] as we discussed before.  Gave her a copy of the approval letter as well as expense sheet along with the [name] OP pharmacy information.  She has my card for any additional financial questions or concerns.

## 2018-04-17 DIAGNOSIS — Z0100 Encounter for examination of eyes and vision without abnormal findings: Secondary | ICD-10-CM | POA: Diagnosis not present

## 2018-04-17 DIAGNOSIS — H524 Presbyopia: Secondary | ICD-10-CM | POA: Diagnosis not present

## 2018-05-02 ENCOUNTER — Encounter: Payer: Self-pay | Admitting: Oncology

## 2018-05-02 ENCOUNTER — Telehealth: Payer: Self-pay | Admitting: General Practice

## 2018-05-02 NOTE — Progress Notes (Signed)
Patient called needing assistance with medical bills from surgery. Advised patient I would send a message to the social workers to have someone reach out to her regarding [employer] [place]; etc for assistance outside of [facility] bills. She verbalized understanding.  Advised her that her approval through [employer] would help with chemo related expenses after insurance pays once she starts treatment.  Advised her she may email me personal household bills to submit to be paid through the [employer]. She verbalized understanding.  She has my name and contact number for any additional financial questions or concerns.

## 2018-05-02 NOTE — Telephone Encounter (Signed)
[facility] Progress Notes   Referral from [employer], Marguarite Arbour, asks that CSW discuss additional options for financial assistance including [employer] in [place], [place].  Called patient, reviewed programs, mailed information packet.  Asked CSW Dalene Seltzer to schedule appt to go over these items on 2/25 as she will be working on that day and patient has scheduled appointment then.  Edwyna Shell, LCSW Clinical Social Worker Phone:  [phone]

## 2018-05-07 ENCOUNTER — Other Ambulatory Visit: Payer: Self-pay | Admitting: *Deleted

## 2018-05-07 DIAGNOSIS — Z171 Estrogen receptor negative status [ER-]: Principal | ICD-10-CM

## 2018-05-07 DIAGNOSIS — C50511 Malignant neoplasm of lower-outer quadrant of right female breast: Secondary | ICD-10-CM

## 2018-05-07 NOTE — Progress Notes (Signed)
[facility]  Telephone:([phone] [phone]    ID: [name] DOB: 16-Jul-1960  MR#: [id number]  DXI#:[id number]  Patient Care Team: Jonathon Jordan, MD as PCP - General (Family Medicine) Jette Lewan, [name], MD as Consulting Physician (Oncology) Stark Klein, MD as Consulting Physician (General Surgery) Eivan Gallina, [name], MD as Consulting Physician (Oncology) Gery Pray, MD as Consulting Physician (Radiation Oncology) Chauncey Cruel, MD OTHER MD:   CHIEF COMPLAINT: Triple negative breast cancer  CURRENT TREATMENT: Adjuvant chemotherapy   HISTORY OF CURRENT ILLNESS: The original intake note:  Kristina Rivera had palpated a lump in her left breast. She then underwent mammography on 02/23/2018 at the [facility] showing a possible abnormality in both the right and left breasts. She underwent bilateral diagnostic mammography with tomography and bilateral breast ultrasonography on 03/01/2018 showing: 3 cm mass in the 8 o'clock position of the right breast highly suspicious for breast malignancy; Two borderline abnormal right axillary lymph nodes with cortical thicknesses of 4 mm; indeterminate mass or lesion in the 2 o'clock retroareolar region of the left breast measuring 6 mm and no left axillary adenopathy.   Accordingly on 03/09/2018 she proceeded to biopsy of the breast areas in question. The pathology from this procedure ([id number]) showed: high grade carcinoma with necrosis in the right breast at 8 o'clock; benign right axilla lymph node; benign breast parenchyma in the left breast at 2 o'clock retroareolar. Prognostic indicators significant for: estrogen receptor, 0% negative and progesterone receptor, 0% negative. Proliferation marker Ki67 at 80%. HER2 equivocal (2+) by immunohistochemistry, but negative by fluorescent in situ hybridization with an initial analysis result with a signals ratio 1.22 and number per cell 4.4. A repeat analysis was  completed showing a signals ratio 1.13 and number per cell 4.43.   She underwent genetic screening on 03/20/2018 with results pending.  The patient's subsequent history is as detailed below.   INTERVAL HISTORY: Creta returns today for follow-up and treatment of her triple negative breast cancer. She is accompanied by her husband.   Genetic testing performed through [facility]'s Common Hereditary Cancers Panel + EGFR on 03/21/2018 showed no pathogenic mutations in APC, ATM, AXIN2, BARD1, BMPR1A, BRCA1, BRCA2, BRIP1, BUB1B, CDH1, CDK4, CDKN2A, CHEK2, CTNNA1, DICER1, ENG, EPCAM, GALNT12, GREM1, HOXB13, KIT, MEN1, MLH1, MLH3, MSH2, MSH3, MSH6, MUTYH, NBN, NF1, NTHL1, PALB2, PDGFRA, PMS2, POLD1, POLE, PTEN, RAD50, RAD51C, RAD51D, RNF43, RPS20, SDHA, SDHB, SDHC, SDHD, SMAD4, SMARCA4, STK11, TP53, TSC1, TSC2, VHL. A variant of uncertain significance (VUS) in a gene called ATM was also noted. c.4082A>G (p.Gln1361Arg).  Since her last visit here, she underwent a right breast lumpectomy on 03/28/2018. The pathology from this procedure showed ([id number]):  1. Breast, lumpectomy, right - invasive carcinoma with medullary features, 3.8 cm. - carcinoma is 0.5 cm from anterior and lateral margins. - negative for lymphovascular or perineural invasion. - see oncology table. 2. Lymph node, sentinel, biopsy, right #1 - lymph node, negative for carcinoma (0/1). 3. Lymph node, sentinel, biopsy, sln right - lymph node, negative for carcinoma (0/1). 4. Lymph node, sentinel, biopsy, right - lymph node, negative for carcinoma (0/1). 5. Lymph node, sentinel, biopsy, right #2 - lymph node, negative for carcinoma (0/1). 6. Lymph node, sentinel, biopsy, right #3   - lymph node, negative for carcinoma (0/1).  Marisha's last echocardiogram on 04/04/2018, showed an ejection fraction in the 55% - 60% range.   She will start adjuvant chemotherapy consisting of cyclophosphamide and doxorubicin in dose dense fashion x4  followed  by weekly carboplatin and paclitaxel x12.   She is schedule for a port insertion on 05/14/2018.   REVIEW OF SYSTEMS: [name] says that she developed an infection with fever following her surgery, who prescribed her an antibiotic for 10 days; she has an upset stomach, but it has mostly resolved. She has some tenderness under her right breast area. Her carpel tunnel has been giving her some issues recently. She has been spending time with her grandson.  She has had some recent insomnia due to anxiety. The patient denies unusual headaches, visual changes, nausea, vomiting, or dizziness. There has been no unusual cough, phlegm production, or pleurisy. This been no change in bowel or bladder habits. The patient denies unexplained fatigue or unexplained weight loss, bleeding, rash, or fever. A detailed review of systems was otherwise noncontributory.    PAST MEDICAL HISTORY: Past Medical History:  Diagnosis Date  . Anxiety   . Arthritis    "back; knees; shoulders" (04/19/2013)  . Asthma   . Chronic lower back pain   . Daily headache    "last couple months" (04/19/2013)  . Depression   . Family history of breast cancer   . Family history of lung cancer     PAST SURGICAL HISTORY: Past Surgical History:  Procedure Laterality Date  . BREAST LUMPECTOMY WITH AXILLARY LYMPH NODE BIOPSY Right 03/28/2018   Procedure: RIGHT BREAST LUMPECTOMY WITH RIGHT SENTINEL LYMPH NODE BIOPSY;  Surgeon: Stark Klein, MD;  Location: [facility];  Service: General;  Laterality: Right;  . CARPAL TUNNEL RELEASE Left 1999?  . TUBAL LIGATION  1993    FAMILY HISTORY: Family History  Problem Relation Age of Onset  . Breast cancer Maternal Aunt 41  . Pneumonia Mother   . Lung cancer Mother 71  . Stroke Father   . Head & neck cancer Sister 30       neck  . Prostate cancer Neg Hx    She underwent commercial genetic testing and was told she is 25% of European Jewish ancestry. Patient father was 83 years old when he  died from stroke. Patient mother died from pneumonia at age 72.  The patient notes a family hx of breast cancer in a maternal aunt diagnosed with breast cancer at age 44.  The patient has 9 siblings, 4 brothers and 5 sisters.  1 sister was diagnosed with neck cancer at age 31, and patient's mom with lung cancer at age 79.   GYNECOLOGIC HISTORY:  No LMP recorded. Patient is postmenopausal. Menarche: 58 years old Age at first live birth: 58 years old [place] P 5 LMP around age 72 Contraceptive: never HRT: no  Hysterectomy? no So? no   SOCIAL HISTORY: She is on disability due to a back injury. Prior to that, she was a Quarry manager at [employer]. Her husband, Mariea Clonts, is retired. He was a Animal nutritionist. They have 5 children. Pascal Lux, age 62, is a Community education officer in [place], [place]. Orvis Brill, age 71, is a Education officer, museum here in [place]. Wendy Poet, age 68, is a school Engineer, water in [place], [place]. Silverio Decamp., age 41, is a Customer service manager here in [place]. Mayflower, age 78, is a nanny in [place], [place]. She and her husband have two grandchildren, and her husband has 7 grandchildren and 7 great-grandchildren from 2 children from a prior marriage. They attend the [employer].  They tell me their religious affiliation does not involve any medical restrictions  ADVANCED DIRECTIVES: The patient and her husband are each others healthcare powers of attorney   HEALTH MAINTENANCE: Social History   Tobacco Use  . Smoking status: Never Smoker  . Smokeless tobacco: Never Used  Substance Use Topics  . Alcohol use: No  . Drug use: No     Colonoscopy: 2010, Dr. Vella Kohler?  PAP: 07/2016  Bone density: more than 3 years ago   Allergies  Allergen Reactions  . Aspirin Anaphylaxis and Hives  . Nsaids Anaphylaxis    Current Outpatient Medications  Medication Sig Dispense Refill  . acetaminophen (TYLENOL) 650 MG CR  tablet Take 1,300 mg by mouth every 8 (eight) hours as needed for pain.    Marland Kitchen albuterol (VENTOLIN HFA) 108 (90 BASE) MCG/ACT inhaler Inhale 2 puffs into the lungs as needed. (Patient taking differently: Inhale 2 puffs into the lungs every 6 (six) hours as needed for wheezing or shortness of breath. ) 1 Inhaler 6  . lidocaine-prilocaine (EMLA) cream Apply to affected area once 30 g 3  . Liniments (PAIN RELIEF EX) Apply 1 application topically 4 (four) times daily as needed (pain.). [employer]ins Deep Penetrating Pain Relief Ointment    . Multiple Vitamin (MULTIVITAMIN) LIQD Take 5 mLs by mouth daily. Nutraburst multi vitamin liquid    . OVER THE COUNTER MEDICATION Take 1 capsule by mouth daily with lunch. Siberian Chaga Extract    . oxyCODONE (OXY IR/ROXICODONE) 5 MG immediate release tablet Take 1 tablet (5 mg total) by mouth every 6 (six) hours as needed for severe pain. (Patient not taking: Reported on 05/04/2018) 20 tablet 0  . prochlorperazine (COMPAZINE) 10 MG tablet Take 1 tablet (10 mg total) by mouth 3 (three) times daily before meals. 30 tablet 1   No current facility-administered medications for this visit.     OBJECTIVE: Morbidly obese African-American woman keeping a fabric around her head to protect from electromagnetic radiation  Vitals:   05/08/18 1130  BP: (!) 153/89  Pulse: 72  Resp: 16  Temp: 98.9 F (37.2 C)  SpO2: 98%     Body mass index is 55.55 kg/m.   Wt Readings from Last 3 Encounters:  05/08/18 (!) 303 lb 11.2 oz (137.8 kg)  03/28/18 (!) 309 lb 3.2 oz (140.3 kg)  03/27/18 (!) 309 lb 3.2 oz (140.3 kg)      ECOG FS:1 - Symptomatic but completely ambulatory  Ocular: Sclerae unicteric, pupils round and equal Ear-nose-throat: Oropharynx clear and moist Lymphatic: No cervical or supraclavicular adenopathy Lungs no rales or rhonchi Heart regular rate and rhythm Abd soft, nontender, positive bowel sounds MSK no focal spinal tenderness, no joint edema Neuro:  non-focal, well-oriented, appropriate affect Breasts: I do not palpate a mass in the right breast.  There is no ecchymosis from the recent biopsy.  The left breast is unremarkable.  Both axillae are benign   LAB RESULTS:  CMP     Component Value Date/Time   NA 142 05/08/2018 1118   K 4.3 05/08/2018 1118   CL 102 05/08/2018 1118   CO2 30 05/08/2018 1118   GLUCOSE 132 (H) 05/08/2018 1118   BUN 12 05/08/2018 1118   CREATININE 0.94 05/08/2018 1118   CREATININE 0.72 11/23/2012 1616   CALCIUM 9.7 05/08/2018 1118   PROT 8.3 (H) 05/08/2018 1118   ALBUMIN 3.7 05/08/2018 1118   AST 25 05/08/2018 1118   ALT 18 05/08/2018 1118   ALKPHOS 76 05/08/2018 1118   BILITOT 0.7 05/08/2018 1118   GFRNONAA >60  05/08/2018 1118   GFRAA >60 05/08/2018 1118    No results found for: TOTALPROTELP, ALBUMINELP, A1GS, A2GS, BETS, BETA2SER, GAMS, MSPIKE, SPEI  No results found for: KPAFRELGTCHN, LAMBDASER, [facility]  Lab Results  Component Value Date   WBC 9.4 05/08/2018   NEUTROABS 4.9 05/08/2018   HGB 12.3 05/08/2018   HCT 41.5 05/08/2018   MCV 73.1 (L) 05/08/2018   PLT 323 05/08/2018    '@LASTCHEMISTRY'$ @  No results found for: LABCA2  No components found for: DXIPJA250  No results for input(s): INR in the last 168 hours.  No results found for: LABCA2  No results found for: NLZ767  No results found for: HAL937  No results found for: TKW409  No results found for: CA2729  No components found for: HGQUANT  No results found for: CEA1 / No results found for: CEA1   No results found for: AFPTUMOR  No results found for: CHROMOGRNA  No results found for: PSA1  Appointment on 05/08/2018  Component Date Value Ref Range Status  . Sodium 05/08/2018 142  135 - 145 mmol/L Final  . Potassium 05/08/2018 4.3  3.5 - 5.1 mmol/L Final  . Chloride 05/08/2018 102  98 - 111 mmol/L Final  . CO2 05/08/2018 30  22 - 32 mmol/L Final  . Glucose, Bld 05/08/2018 132* 70 - 99 mg/dL Final  . BUN  05/08/2018 12  6 - 20 mg/dL Final  . Creatinine 05/08/2018 0.94  0.44 - 1.00 mg/dL Final  . Calcium 05/08/2018 9.7  8.9 - 10.3 mg/dL Final  . Total Protein 05/08/2018 8.3* 6.5 - 8.1 g/dL Final  . Albumin 05/08/2018 3.7  3.5 - 5.0 g/dL Final  . AST 05/08/2018 25  15 - 41 U/L Final  . ALT 05/08/2018 18  0 - 44 U/L Final  . Alkaline Phosphatase 05/08/2018 76  38 - 126 U/L Final  . Total Bilirubin 05/08/2018 0.7  0.3 - 1.2 mg/dL Final  . GFR, Est Non Af Am 05/08/2018 >60  >60 mL/min Final  . GFR, Est AFR Am 05/08/2018 >60  >60 mL/min Final  . Anion gap 05/08/2018 10  5 - 15 Final   Performed at [facility], [place] [address], [place],  [address]  . WBC Count 05/08/2018 9.4  4.0 - 10.5 K/uL Final  . RBC 05/08/2018 5.68* 3.87 - 5.11 MIL/uL Final  . Hemoglobin 05/08/2018 12.3  12.0 - 15.0 g/dL Final  . HCT 05/08/2018 41.5  36.0 - 46.0 % Final  . MCV 05/08/2018 73.1* 80.0 - 100.0 fL Final  . MCH 05/08/2018 21.7* 26.0 - 34.0 pg Final  . MCHC 05/08/2018 29.6* 30.0 - 36.0 g/dL Final  . RDW 05/08/2018 15.8* 11.5 - 15.5 % Final  . Platelet Count 05/08/2018 323  150 - 400 K/uL Final  . nRBC 05/08/2018 0.0  0.0 - 0.2 % Final  . Neutrophils Relative % 05/08/2018 51  % Final  . Neutro Abs 05/08/2018 4.9  1.7 - 7.7 K/uL Final  . Lymphocytes Relative 05/08/2018 34  % Final  . Lymphs Abs 05/08/2018 3.2  0.7 - 4.0 K/uL Final  . Monocytes Relative 05/08/2018 8  % Final  . Monocytes Absolute 05/08/2018 0.8  0.1 - 1.0 K/uL Final  . Eosinophils Relative 05/08/2018 5  % Final  . Eosinophils Absolute 05/08/2018 0.4  0.0 - 0.5 K/uL Final  . Basophils Relative 05/08/2018 1  % Final  . Basophils Absolute 05/08/2018 0.1  0.0 - 0.1 K/uL Final  . Immature  Granulocytes 05/08/2018 1  % Final  . Abs Immature Granulocytes 05/08/2018 0.05  0.00 - 0.07 K/uL Final   Performed at [facility], [place] [address] [place], [place] [address]    (this displays the last  labs from the last 3 days)  No results found for: TOTALPROTELP, ALBUMINELP, A1GS, A2GS, BETS, BETA2SER, GAMS, MSPIKE, SPEI (this displays SPEP labs)  No results found for: KPAFRELGTCHN, LAMBDASER, KAPLAMBRATIO (kappa/lambda light chains)  No results found for: HGBA, HGBA2QUANT, HGBFQUANT, HGBSQUAN (Hemoglobinopathy evaluation)   No results found for: LDH  No results found for: IRON, TIBC, IRONPCTSAT (Iron and TIBC)  No results found for: FERRITIN  Urinalysis No results found for: COLORURINE, APPEARANCEUR, LABSPEC, PHURINE, GLUCOSEU, HGBUR, BILIRUBINUR, KETONESUR, PROTEINUR, UROBILINOGEN, NITRITE, LEUKOCYTESUR   STUDIES: No results found.   ELIGIBLE FOR AVAILABLE RESEARCH PROTOCOL: no   ASSESSMENT: 58 y.o. [place] woman status post right breast lower outer quadrant biopsy 03/09/2018 for a clinical T2N0, stage IIB invasive ductal carcinoma, triple negative, with an MIB-1 of 80%  (1) Genetic testing performed through [facility]'s Common Hereditary Cancers Panel + EGFR on 03/21/2018 showed no pathogenic mutations in APC, ATM, AXIN2, BARD1, BMPR1A, BRCA1, BRCA2, BRIP1, BUB1B, CDH1, CDK4, CDKN2A, CHEK2, CTNNA1, DICER1, ENG, EPCAM, GALNT12, GREM1, HOXB13, KIT, MEN1, MLH1, MLH3, MSH2, MSH3, MSH6, MUTYH, NBN, NF1, NTHL1, PALB2, PDGFRA, PMS2, POLD1, POLE, PTEN, RAD50, RAD51C, RAD51D, RNF43, RPS20, SDHA, SDHB, SDHC, SDHD, SMAD4, SMARCA4, STK11, TP53, TSC1, TSC2, VHL.   (a) A variant of uncertain significance (VUS) in a gene called ATM was also noted. c.4082A>G (p.Gln1361Arg).  (2) status post right lumpectomy and sentinel lymph node sampling 03/28/2018 for a pT2 pN0, stage IIB invasive ductal carcinoma, with medullary features, negative margins.  (3) adjuvant chemotherapy will consist of cyclophosphamide, methotrexate and fluorouracil given every 21 days x 8   (3) adjuvant radiation to follow (may be done concurrently with cycles 4 through 6)   PLAN: Kieli had some problems with the  surgery but she has recovered.  She is scheduled to have a port placed 05/14/2018.  We had discussed very intensive chemotherapy for her, but given the fact that we are dealing with a node-negative tumor that has medullary features, I am going to switch her to CMF instead of AC/CT.  Today we discussed the different plan in detail and she understands the rationale.  We discussed the possible toxicities side effects and complications of this combination.  She understands there is a possibility she may not lose her hair and most likely she will not need an immune booster shot either.  She will use prochlorperazine primarily for nausea with ondansetron as backup.  If she has problems with nausea of course she will let us know and we can bring her in for fluids or other support  She has her port put in 05/14/2018.  She can have the chemo the next day in which case she will be "pretty accessed".  Otherwise she will use the EMLA cream as discussed previously by the teaching nurse and also again today  She will see [place] again a week after chemo to check nadir counts and make sure she had no unusual side effects to the treatment  Her chemotherapy can start with cycle 4 of CMF.  We will omit the methotrexate for those cycles  She knows to call for any other issue that may develop before the next visit.   Shaunda Tipping, [name], MD  05/08/18 12:22 PM Medical Oncology and Hematology [place]  [place],  [address] Tel. [phone]    Fax. [phone]  I, Jacqualyn Posey am acting as a Education administrator for Chauncey Cruel, MD.   I, Lurline Del MD, have reviewed the above documentation for accuracy and completeness, and I agree with the above.

## 2018-05-08 ENCOUNTER — Inpatient Hospital Stay: Payer: Medicare HMO | Attending: Oncology

## 2018-05-08 ENCOUNTER — Inpatient Hospital Stay: Payer: Medicare HMO | Admitting: *Deleted

## 2018-05-08 ENCOUNTER — Other Ambulatory Visit: Payer: Self-pay | Admitting: General Surgery

## 2018-05-08 ENCOUNTER — Inpatient Hospital Stay (HOSPITAL_BASED_OUTPATIENT_CLINIC_OR_DEPARTMENT_OTHER): Payer: Medicare HMO | Admitting: Oncology

## 2018-05-08 VITALS — BP 153/89 | HR 72 | Temp 98.9°F | Resp 16 | Ht 62.0 in | Wt 303.7 lb

## 2018-05-08 DIAGNOSIS — C50511 Malignant neoplasm of lower-outer quadrant of right female breast: Secondary | ICD-10-CM | POA: Insufficient documentation

## 2018-05-08 DIAGNOSIS — Z171 Estrogen receptor negative status [ER-]: Secondary | ICD-10-CM | POA: Insufficient documentation

## 2018-05-08 DIAGNOSIS — M940 Chondrocostal junction syndrome [Tietze]: Secondary | ICD-10-CM

## 2018-05-08 LAB — CMP (CANCER CENTER ONLY)
ALT: 18 U/L (ref 0–44)
AST: 25 U/L (ref 15–41)
Albumin: 3.7 g/dL (ref 3.5–5.0)
Alkaline Phosphatase: 76 U/L (ref 38–126)
Anion gap: 10 (ref 5–15)
BUN: 12 mg/dL (ref 6–20)
CO2: 30 mmol/L (ref 22–32)
Calcium: 9.7 mg/dL (ref 8.9–10.3)
Chloride: 102 mmol/L (ref 98–111)
Creatinine: 0.94 mg/dL (ref 0.44–1.00)
GFR, Est AFR Am: >60 mL/min (ref >60)
GFR, Estimated: >60 mL/min (ref >60)
GLUCOSE: 132 mg/dL — AB (ref 70–99)
Potassium: 4.3 mmol/L (ref 3.5–5.1)
Sodium: 142 mmol/L (ref 135–145)
Total Bilirubin: 0.7 mg/dL (ref 0.3–1.2)
Total Protein: 8.3 g/dL — ABNORMAL HIGH (ref 6.5–8.1)

## 2018-05-08 LAB — CBC WITH DIFFERENTIAL (CANCER CENTER ONLY)
Abs Immature Granulocytes: 0.05 10*3/uL (ref 0.00–0.07)
Basophils Absolute: 0.1 10*3/uL (ref 0.0–0.1)
Basophils Relative: 1 %
Eosinophils Absolute: 0.4 10*3/uL (ref 0.0–0.5)
Eosinophils Relative: 5 %
HCT: 41.5 % (ref 36.0–46.0)
Hemoglobin: 12.3 g/dL (ref 12.0–15.0)
Immature Granulocytes: 1 %
Lymphocytes Relative: 34 %
Lymphs Abs: 3.2 10*3/uL (ref 0.7–4.0)
MCH: 21.7 pg — AB (ref 26.0–34.0)
MCHC: 29.6 g/dL — ABNORMAL LOW (ref 30.0–36.0)
MCV: 73.1 fL — ABNORMAL LOW (ref 80.0–100.0)
MONO ABS: 0.8 10*3/uL (ref 0.1–1.0)
MONOS PCT: 8 %
Neutro Abs: 4.9 10*3/uL (ref 1.7–7.7)
Neutrophils Relative %: 51 %
Platelet Count: 323 10*3/uL (ref 150–400)
RBC: 5.68 MIL/uL — ABNORMAL HIGH (ref 3.87–5.11)
RDW: 15.8 % — ABNORMAL HIGH (ref 11.5–15.5)
WBC Count: 9.4 10*3/uL (ref 4.0–10.5)
nRBC: 0 % (ref 0.0–0.2)

## 2018-05-08 MED ORDER — PROCHLORPERAZINE MALEATE 10 MG PO TABS: 10.0000 mg | ORAL_TABLET | Freq: Three times a day (TID) | ORAL | 1 refills | Status: DC

## 2018-05-08 MED ORDER — LIDOCAINE-PRILOCAINE 2.5-2.5 % EX CREA: TOPICAL_CREAM | CUTANEOUS | 3 refills | Status: DC

## 2018-05-08 NOTE — Progress Notes (Signed)
[facility] Clinical Social Work  Clinical Social Worker met with patient in CSW office to offer support and review financial resources.  Patient has previously expressed financial concerns do to increased co pays for doctor appointments.  Patient has applied and been approved for the [facility] alight grant.  CSW and patient reviewed applications for Cancer Care and Pretty in Pink.  Patient plans to call cancer care and initiate the application process.  CSW and patient reviewed application and requested documentation for pretty in pink.  Patient will collect requested information and contact CSW.  CSW provided contact information and encouraged patient to call with questions or concerns.   , MSW, LCSW, OSW-C Clinical Social Worker [facility] [phone]          

## 2018-05-08 NOTE — Progress Notes (Signed)
DISCONTINUE ON PATHWAY REGIMEN - Breast     A cycle is every 14 days (cycles 1-4):     Doxorubicin      Cyclophosphamide      Pegfilgrastim-xxxx    A cycle is every 21 days (cycles 5-8):     Paclitaxel      Carboplatin   **Always confirm dose/schedule in your pharmacy ordering system**  REASON: Other Reason PRIOR TREATMENT: [id number]: Dose-Dense AC [Doxorubicin + Cyclophosphamide q14 Days x 4 Cycles], Followed by Paclitaxel 80 mg/m2 Weekly + Carboplatin AUC=6 q21 Days x 12 Weeks TREATMENT RESPONSE: Unable to Evaluate  START OFF PATHWAY REGIMEN - Breast   [id number]:CMF (IV cyclophosphamide) q21 days:   A cycle is every 21 days:     Cyclophosphamide      Methotrexate      5-Fluorouracil   **Always confirm dose/schedule in your pharmacy ordering system**  Patient Characteristics: Postoperative without Neoadjuvant Therapy (Pathologic Staging), Invasive Disease, Adjuvant Therapy, HER2 Negative/Unknown/Equivocal, ER Negative/Unknown, Node Negative, pT1a-c, pN0/N58m or pT2 or Higher, pN0 Therapeutic Status: Postoperative without Neoadjuvant Therapy (Pathologic Staging) AJCC Grade: G3 AJCC N Category: pN0 AJCC M Category: cM0 ER Status: Negative (-) AJCC 8 Stage Grouping: IIA HER2 Status: Negative (-) Oncotype Dx Recurrence Score: Not Appropriate AJCC T Category: pT2 PR Status: Negative (-) Intent of Therapy: Curative Intent, Discussed with Patient

## 2018-05-09 NOTE — Pre-Procedure Instructions (Signed)
Kristina Rivera  05/09/2018      [facility] [place] [address] [place] [address] Phone: [phone] Fax: [phone]    Your procedure is scheduled on March 2nd.  Report to [facility] Entrance "A" Admitting at 5:30 A.M.  Call this number if you have problems the morning of surgery:  [phone]   Remember:  Do not eat or drink after midnight.  You may drink clear liquids until 4:30 .  Clear liquids allowed are: Water, Juice (non-citric and without pulp), Carbonated beverages, Clear Tea, Black Coffee only, Plain Jell-O only and Gatorade    Take these medicines the morning of surgery with A SIP OF WATER   Tylenol - if needed  Albuterol Inhaler - if needed (bring with you on day of surgery)  Prochlorperazine (Compazine)    Do not wear jewelry, make-up or nail polish.  Do not wear lotions, powders, or perfumes, or deodorant.  Do not shave 48 hours prior to surgery.    Do not bring valuables to the hospital.  [facility] is not responsible for any belongings or valuables.   [place]- Preparing For Surgery  Before surgery, you can play an important role. Because skin is not sterile, your skin needs to be as free of germs as possible. You can reduce the number of germs on your skin by washing with CHG (chlorahexidine gluconate) Soap before surgery.  CHG is an antiseptic cleaner which kills germs and bonds with the skin to continue killing germs even after washing.    Oral Hygiene is also important to reduce your risk of infection.  Remember - BRUSH YOUR TEETH THE MORNING OF SURGERY WITH YOUR REGULAR TOOTHPASTE  Please do not use if you have an allergy to CHG or antibacterial soaps. If your skin becomes reddened/irritated stop using the CHG.  Do not shave (including legs and underarms) for at least 48 hours prior to first CHG shower. It is OK to shave your  face.  Please follow these instructions carefully.   1. Shower the NIGHT BEFORE SURGERY and the MORNING OF SURGERY with CHG.   2. If you chose to wash your hair, wash your hair first as usual with your normal shampoo.  3. After you shampoo, rinse your hair and body thoroughly to remove the shampoo.  4. Use CHG as you would any other liquid soap. You can apply CHG directly to the skin and wash gently with a scrungie or a clean washcloth.   5. Apply the CHG Soap to your body ONLY FROM THE NECK DOWN.  Do not use on open wounds or open sores. Avoid contact with your eyes, ears, mouth and genitals (private parts). Wash Face and genitals (private parts)  with your normal soap.  6. Wash thoroughly, paying special attention to the area where your surgery will be performed.  7. Thoroughly rinse your body with warm water from the neck down.  8. DO NOT shower/wash with your normal soap after using and rinsing off the CHG Soap.  9. Pat yourself dry with a CLEAN TOWEL.  10. Wear CLEAN PAJAMAS to bed the night before surgery, wear comfortable clothes the morning of surgery  11. Place CLEAN SHEETS on your bed the night of your first shower and DO NOT SLEEP WITH PETS.   Day of Surgery:  Do not apply any deodorants/lotions.  Please wear clean clothes to  the hospital/surgery center.   Remember to brush your teeth WITH YOUR REGULAR TOOTHPASTE.   Contacts, dentures or bridgework may not be worn into surgery.  Leave your suitcase in the car.  After surgery it may be brought to your room.  For patients admitted to the hospital, discharge time will be determined by your treatment team.  Patients discharged the day of surgery will not be allowed to drive home.   Please read over the following fact sheets that you were given. Coughing and Deep Breathing and Surgical Site Infection Prevention

## 2018-05-10 ENCOUNTER — Encounter (HOSPITAL_COMMUNITY)
Admission: RE | Admit: 2018-05-10 | Discharge: 2018-05-10 | Disposition: A | Discharge status: Home / Self Care | Payer: Medicare HMO | Source: Ambulatory Visit | Attending: General Surgery | Admitting: General Surgery

## 2018-05-10 ENCOUNTER — Telehealth: Payer: Self-pay | Admitting: General Practice

## 2018-05-10 ENCOUNTER — Ambulatory Visit: Payer: Medicare HMO | Attending: General Surgery | Admitting: Physical Therapy

## 2018-05-10 ENCOUNTER — Other Ambulatory Visit: Payer: Self-pay

## 2018-05-10 ENCOUNTER — Encounter (HOSPITAL_COMMUNITY): Payer: Self-pay

## 2018-05-10 ENCOUNTER — Encounter: Payer: Self-pay | Admitting: Physical Therapy

## 2018-05-10 DIAGNOSIS — Z483 Aftercare following surgery for neoplasm: Secondary | ICD-10-CM | POA: Diagnosis not present

## 2018-05-10 DIAGNOSIS — M25511 Pain in right shoulder: Secondary | ICD-10-CM | POA: Insufficient documentation

## 2018-05-10 DIAGNOSIS — C50511 Malignant neoplasm of lower-outer quadrant of right female breast: Secondary | ICD-10-CM | POA: Diagnosis not present

## 2018-05-10 DIAGNOSIS — M25611 Stiffness of right shoulder, not elsewhere classified: Secondary | ICD-10-CM | POA: Diagnosis not present

## 2018-05-10 DIAGNOSIS — G8929 Other chronic pain: Secondary | ICD-10-CM | POA: Insufficient documentation

## 2018-05-10 DIAGNOSIS — R293 Abnormal posture: Secondary | ICD-10-CM | POA: Insufficient documentation

## 2018-05-10 DIAGNOSIS — Z171 Estrogen receptor negative status [ER-]: Secondary | ICD-10-CM | POA: Diagnosis not present

## 2018-05-10 DIAGNOSIS — Z01812 Encounter for preprocedural laboratory examination: Secondary | ICD-10-CM | POA: Insufficient documentation

## 2018-05-10 NOTE — Progress Notes (Signed)
Kristina Rivera            05/09/2018                          [facility] [place], [place] - [address] AT [address] [place] [address] Phone: [phone] Fax: [phone]              Your procedure is scheduled on March 2nd.            Report to [facility] Entrance "A" Admitting at 5:30 AM.            Call this number if you have problems the morning of surgery:            [phone]             Remember:            Do not eat or drink after midnight.            You may drink clear liquids until 4:30 .  Clear liquids allowed are: Water, Juice (non-citric and without pulp), Carbonated beverages, Clear Tea, Black Coffee only, Plain Jell-O only and Gatorade                        Take these medicines the morning of surgery with A SIP OF WATER             Tylenol - if needed            Albuterol Inhaler - if needed (bring with you on day of surgery)            Prochlorperazine (Compazine)   7 days prior to surgery STOP taking any Aspirin (unless otherwise instructed by your surgeon), Aleve, Naproxen, Ibuprofen, Motrin, Advil, Goody's, BC's, all herbal medications, fish oil, and all vitamins                       Do not wear jewelry, make-up or nail polish.            Do not wear lotions, powders, or perfumes, or deodorant.            Do not shave 48 hours prior to surgery.              Do not bring valuables to the hospital.            [facility] is not responsible for any belongings or valuables.   [place]- Preparing For Surgery  Before surgery, you can play an important role. Because skin is not sterile, your skin needs to be as free of germs as possible. You can reduce the number of germs on your skin by washing with CHG (chlorahexidine gluconate) Soap before surgery.  CHG is an antiseptic cleaner which kills germs and bonds with the skin to continue killing germs even after  washing.    Oral Hygiene is also important to reduce your risk of infection.  Remember - BRUSH YOUR TEETH THE MORNING OF SURGERY WITH YOUR REGULAR TOOTHPASTE  Please do not use if you have an allergy to CHG or antibacterial soaps. If your skin becomes reddened/irritated stop using the CHG.  Do not shave (including legs and underarms) for at least 48 hours prior to first CHG shower. It is OK to shave your face.  Please follow  these instructions carefully.                                                                                                                     1. Shower the NIGHT BEFORE SURGERY and the MORNING OF SURGERY with CHG.   2. If you chose to wash your hair, wash your hair first as usual with your normal shampoo.  3. After you shampoo, rinse your hair and body thoroughly to remove the shampoo.  4. Use CHG as you would any other liquid soap. You can apply CHG directly to the skin and wash gently with a scrungie or a clean washcloth.   5. Apply the CHG Soap to your body ONLY FROM THE NECK DOWN.  Do not use on open wounds or open sores. Avoid contact with your eyes, ears, mouth and genitals (private parts). Wash Face and genitals (private parts)  with your normal soap.  6. Wash thoroughly, paying special attention to the area where your surgery will be performed.  7. Thoroughly rinse your body with warm water from the neck down.  8. DO NOT shower/wash with your normal soap after using and rinsing off the CHG Soap.  9. Pat yourself dry with a CLEAN TOWEL.  10. Wear CLEAN PAJAMAS to bed the night before surgery, wear comfortable clothes the morning of surgery  11. Place CLEAN SHEETS on your bed the night of your first shower and DO NOT SLEEP WITH PETS.   Day of Surgery:  Do not apply any deodorants/lotions.  Please wear clean clothes to the hospital/surgery center.   Remember to brush your teeth WITH YOUR REGULAR TOOTHPASTE.   Contacts, dentures or  bridgework may not be worn into surgery.  Leave your suitcase in the car.  After surgery it may be brought to your room.  For patients admitted to the hospital, discharge time will be determined by your treatment team.  Patients discharged the day of surgery will not be allowed to drive home.   Please read over the following fact sheets that you were given.

## 2018-05-10 NOTE — Therapy (Signed)
[place], [place], [address] Phone: [phone]   Fax:  [phone]  Physical Therapy Treatment  Patient Details  Name: Kristina Rivera MRN: [id number] Date of Birth: May 31, 1960 Referring Provider (PT): Dr. Stark Klein   Encounter Date: 05/10/2018  PT End of Session - 05/10/18 1123    Visit Number  2    Number of Visits  2    PT Start Time  0848    PT Stop Time  0928    PT Time Calculation (min)  40 min    Activity Tolerance  Treatment limited secondary to medical complications (Comment)   Limited by symptoms from electromagnetic sensitivity   Behavior During Therapy  Anxious       Past Medical History:  Diagnosis Date  . Anxiety   . Arthritis    "back; knees; shoulders" (04/19/2013)  . Asthma   . Chronic lower back pain   . Daily headache    "last couple months" (04/19/2013)  . Depression   . Family history of breast cancer   . Family history of lung cancer     Past Surgical History:  Procedure Laterality Date  . BREAST LUMPECTOMY WITH AXILLARY LYMPH NODE BIOPSY Right 03/28/2018   Procedure: RIGHT BREAST LUMPECTOMY WITH RIGHT SENTINEL LYMPH NODE BIOPSY;  Surgeon: Stark Klein, MD;  Location: [facility];  Service: General;  Laterality: Right;  . CARPAL TUNNEL RELEASE Left 1999?  . TUBAL LIGATION  1993    There were no vitals filed for this visit.  Subjective Assessment - 05/10/18 0854    Subjective  Patient reports she underwent a right lumpectomy and sentinel node biopsy (0/5 nodes positive) on 03/28/2018. She will have a port placed on 05/14/2018 and begin chemotherapy on 05/15/2018. She will have radiation following that. Patient reports having a difficult time being in our clinic due to her senisitivyt to electromagnetic fields. PT had to get computer and watch out of the room and turn lights out.    Patient is accompained by:  Family member    Pertinent History  Patient was diagnosed on 02/23/18  with right triple negative grade III invasive ductal carcinoma breast cancer. Ki67 was 80%. Patient reports she underwent a right lumpectomy and sentinel node biopsy (0/5 nodes positive) on 03/28/2018.    Patient Stated Goals  See if my arm is ok    Currently in Pain?  Yes    Pain Score  8     Pain Location  Axilla    Pain Orientation  Right    Pain Descriptors / Indicators  Heaviness    Pain Type  Surgical pain    Pain Onset  In the past 7 days    Pain Frequency  Intermittent    Aggravating Factors   Using arm    Pain Relieving Factors  Rest         [facility] PT Assessment - 05/10/18 0001      Assessment   Medical Diagnosis  Right breast cancer    Referring Provider (PT)  Dr. Stark Klein    Onset Date/Surgical Date  03/28/18    Hand Dominance  Right    Prior Therapy  Baselines      Precautions   Precautions  Other (comment)    Precaution Comments  active cancer; has electromagnetic field sensitivity; right arm lymphedema risk      Restrictions   Weight Bearing Restrictions  No      Home Environment  Living Environment  Private residence    Living Arrangements  Spouse/significant other;Children   Husband and 36 y.o. son   Available Help at Discharge  Family      Prior Function   Level of [place]  On disability    Leisure  She does not exercise      Cognition   Overall Cognitive Status  Within Functional Limits for tasks assessed   Very anxious during session     Observation/Other Assessments   Observations  Incisions appear to be healing well; no c/o pain with palpation; no increased edema present      Posture/Postural Control   Posture/Postural Control  Postural limitations    Postural Limitations  Forward head;Rounded Shoulders      AROM   Overall AROM Comments  ROM limited by pain; ROM taken twice as pt became symptomatic with electromagnetic sensitivity    AROM Assessment Site  Shoulder    Right/Left Shoulder  Right    Right  Shoulder Extension  62 Degrees    Right Shoulder Flexion  101 Degrees    Right Shoulder ABduction  84 Degrees      Strength   Overall Strength  Within functional limits for tasks performed        LYMPHEDEMA/ONCOLOGY QUESTIONNAIRE - 05/10/18 0859      Type   Cancer Type  Right breast      Surgeries   Lumpectomy Date  03/28/18    Sentinel Lymph Node Biopsy Date  03/28/18    Number Lymph Nodes Removed  5      Treatment   Active Chemotherapy Treatment  No   Begins 05/15/2018   Past Chemotherapy Treatment  No    Active Radiation Treatment  No    Past Radiation Treatment  No    Current Hormone Treatment  No    Past Hormone Therapy  No      What other symptoms do you have   Are you Having Heaviness or Tightness  Yes    Are you having Pain  Yes    Are you having pitting edema  No    Is it Hard or Difficult finding clothes that fit  No    Do you have infections  Yes   Completed antibiotic course   Is there Decreased scar mobility  No      Lymphedema Assessments   Lymphedema Assessments  Upper extremities      Right Upper Extremity Lymphedema   10 cm Proximal to Olecranon Process  44 cm    Olecranon Process  31.2 cm    10 cm Proximal to Ulnar Styloid Process  28.7 cm    Just Proximal to Ulnar Styloid Process  18.3 cm    Across Hand at [employer]  20.7 cm    At [place] of 2nd Digit  6.8 cm      Left Upper Extremity Lymphedema   10 cm Proximal to Olecranon Process  43.1 cm    Olecranon Process  30.4 cm    10 cm Proximal to Ulnar Styloid Process  28.3 cm    Just Proximal to Ulnar Styloid Process  18 cm    Across Hand at [employer]  20.3 cm    At [place] of 2nd Digit  6.6 cm        Quick Dash - 05/10/18 0001    Open a tight or new jar  Moderate difficulty    Do heavy  household chores (wash walls, wash floors)  Moderate difficulty    Carry a shopping bag or briefcase  Severe difficulty    Wash your back  Moderate difficulty    Use a knife to cut food  Moderate  difficulty    Recreational activities in which you take some force or impact through your arm, shoulder, or hand (golf, hammering, tennis)  Moderate difficulty    During the past week, to what extent has your arm, shoulder or hand problem interfered with your normal social activities with family, friends, neighbors, or groups?  Modererately    During the past week, to what extent has your arm, shoulder or hand problem limited your work or other regular daily activities  Modererately    Arm, shoulder, or hand pain.  Severe    Tingling (pins and needles) in your arm, shoulder, or hand  Severe    Difficulty Sleeping  Moderate difficulty    DASH Score  56.82 %                     PT Education - 05/10/18 0934    Education Details  Reviewed HEP; issued information normally given at [facility] class as pt would not tolerate being here for that class    Person(s) Educated  Spouse    Methods  Explanation;Demonstration    Comprehension  Returned demonstration;Verbalized understanding          PT Long Term Goals - 05/10/18 1130      PT LONG TERM GOAL #1   Title  Patient will demonstrate she has regained full shoulder ROM and function s/p breast surgery when compared to baseline measurements.    Baseline  Shoulder flexion and abduction are limited    Time  8    Period  Weeks            Plan - 05/10/18 1125    Clinical Impression Statement  Patient appears to be physically doing fine following her right lumpectomy and SLNB. She had difficulty today in the physical therapy clinic due to her symptoms related to her electromagnetic field sensitivity. PT had to remove computer and watch from the room and dim the lights in order to complete her assessment. We also had to stop and perform deep breathing to reduce anxiety. She reported chest pain prior to removeal of the computer but reported immediate relief after computer was removed. She reported this happens frequently. Patient would  benefit from some PT to improve shoulder function and assist with her scar tightness/edema but she will not tolerate being treated in an area that uses Wifi per her report. PT offered to accommodate her in every way possible including doing some treatment in the parking lot but the patient requested to try exercising on her own and call if things do not improve. She was educated on lymphedema risk reduction and issued the After Breast Cancer class packet as she would not tolerate sitting through a class at our clinic. Her post op HEP program was reviewed and she demonstrated good understanding. We would be happy to try to accommodate her in therapy if she needs it in the future.     PT Treatment/Interventions  ADLs/Self Care Home Management;Patient/family education;Therapeutic exercise    PT Next Visit Plan  D/C    PT Home Exercise Plan  Post op shoulder ROM HEP and lymphedema risk reduction    Consulted and Agree with Plan of Care  Patient;Family member/caregiver    Family Member  Consulted  Husband       Patient will benefit from skilled therapeutic intervention in order to improve the following deficits and impairments:  Decreased range of motion, Impaired UE functional use, Pain, Decreased knowledge of precautions, Postural dysfunction  Visit Diagnosis: Malignant neoplasm of lower-outer quadrant of right breast of female, estrogen receptor negative (HCC)  Abnormal posture  Chronic right shoulder pain  Stiffness of right shoulder, not elsewhere classified  Aftercare following surgery for neoplasm     Problem List Patient Active Problem List   Diagnosis Date Noted  . Costochondritis 05/08/2018  . Genetic testing 03/30/2018  . Family history of breast cancer   . Family history of lung cancer   . Morbid obesity with BMI of 45.0-49.9, adult ([place]) 03/21/2018  . Malignant neoplasm of lower-outer quadrant of right breast of female, estrogen receptor negative ([place]) 03/15/2018  . Chest  pain 04/19/2013  . Post-menopausal bleeding 04/30/2012  . Back pain 03/26/2012  . Depression 03/09/2012  . Right knee pain 02/10/2011  . PANIC ATTACK 06/22/2009  . RHINITIS, ALLERGIC 05/11/2006  . ASTHMA, INTERMITTENT 05/11/2006   PHYSICAL THERAPY DISCHARGE SUMMARY  Visits from Start of Care: 2  Current functional level related to goals / functional outcomes: See above for objective findings. Shoulder ROM is limited but pt unable to tolerate attending therapy due to electromagnetic field sensitivity.   Remaining deficits: ROM and function deficits right upper extremity.   Education / Equipment: HEP and lymphedema risk reduction.  Plan: Patient agrees to discharge.  Patient goals were not met. Patient is being discharged due to the patient's request.  ?????         Annia Friendly, [place] 05/10/18 11:32 AM  [place], [place], [address] Phone: [phone]   Fax:  [phone]  Name: Kristina Rivera MRN: [id number] Date of Birth: December 27, 1960

## 2018-05-10 NOTE — Progress Notes (Addendum)
PCP - Jonathon Jordan, MD Cardiologist - pt denies  Chest x-ray - pt denies, no recent respiratory infections/complications EKG - 04/11/2079  Stress Test - pt denies ECHO - 04/04/2018  Cardiac Cath - pt denies  Sleep Study - pt denies CPAP - denies  Fasting Blood Sugar - n/a Checks Blood Sugar _____ times a day-n/a  Blood Thinner Instructions: n/a Aspirin Instructions: n/a  Anesthesia review: Labs from 05/08/2018, EKG  Patient denies shortness of breath, fever, cough and chest pain at PAT appointment  Patient verbalized understanding of instructions that were given to them at the PAT appointment. Patient was also instructed that they will need to review over the PAT instructions again at home before surgery.

## 2018-05-10 NOTE — Telephone Encounter (Signed)
[place] CSW Progress Notes  Patient called w questions about [employer] and [employer] [facility] [place] applications, information given.  She hopes to bring these applications in on 5/86 to get help w completing and submitting these.  Edwyna Shell, LCSW Clinical Social Worker Phone:  [phone]

## 2018-05-11 MED ORDER — DEXTROSE 5 % IV SOLN
3.0000 g | INTRAVENOUS | Status: AC
  Administered 2018-05-14: 3 g via INTRAVENOUS
  Filled 2018-05-11: qty 3

## 2018-05-11 NOTE — Anesthesia Preprocedure Evaluation (Addendum)
Anesthesia Evaluation  Patient identified by MRN, date of birth, ID band Patient awake    Reviewed: Allergy & Precautions, NPO status , Patient's Chart, lab work & pertinent test results  History of Anesthesia Complications Negative for: history of anesthetic complications  Airway Mallampati: II  TM Distance: >3 FB Neck ROM: Full    Dental  (+) Partial Lower, Partial Upper   Pulmonary asthma ,    Pulmonary exam normal breath sounds clear to auscultation       Cardiovascular negative cardio ROS Normal cardiovascular exam Rhythm:Regular Rate:Normal     Neuro/Psych negative neurological ROS  negative psych ROS   GI/Hepatic negative GI ROS, Neg liver ROS,   Endo/Other  Morbid obesity  Renal/GU negative Renal ROS  negative genitourinary   Musculoskeletal negative musculoskeletal ROS (+)   Abdominal   Peds  Hematology negative hematology ROS (+)   Anesthesia Other Findings 58 yo F for port placement - PMH: asthma, breast cancer, BMI 55 - TTE 04/04/18: EF 50-55%, grade 1 dd, mod LAE  Reproductive/Obstetrics                            Anesthesia Physical Anesthesia Plan  ASA: III  Anesthesia Plan: General   Post-op Pain Management:    Induction: Intravenous  PONV Risk Score and Plan: 3 and Ondansetron, Dexamethasone, Midazolam and Treatment may vary due to age or medical condition  Airway Management Planned: LMA and Oral ETT  Additional Equipment: None  Intra-op Plan:   Post-operative Plan: Extubation in OR  Informed Consent: I have reviewed the patients History and Physical, chart, labs and discussed the procedure including the risks, benefits and alternatives for the proposed anesthesia with the patient or authorized representative who has indicated his/her understanding and acceptance.     Dental advisory given  Plan Discussed with:   Anesthesia Plan Comments: (TTE 04/04/18  shows LVEF is low normal at 50-55%. No significant valvular abnormalities. Abnormal global longitudinal strain: -14.7%.)      Anesthesia Quick Evaluation

## 2018-05-14 ENCOUNTER — Ambulatory Visit (HOSPITAL_COMMUNITY): Payer: Medicare HMO

## 2018-05-14 ENCOUNTER — Encounter (HOSPITAL_COMMUNITY): Payer: Self-pay

## 2018-05-14 ENCOUNTER — Encounter (HOSPITAL_COMMUNITY)
Admission: RE | Disposition: A | Discharge status: Home / Self Care | Payer: Self-pay | Source: Home / Self Care | Attending: General Surgery

## 2018-05-14 ENCOUNTER — Ambulatory Visit (HOSPITAL_COMMUNITY): Payer: Medicare HMO | Admitting: Anesthesiology

## 2018-05-14 ENCOUNTER — Ambulatory Visit (HOSPITAL_COMMUNITY): Payer: Medicare HMO | Admitting: Physician Assistant

## 2018-05-14 ENCOUNTER — Other Ambulatory Visit: Payer: Self-pay

## 2018-05-14 ENCOUNTER — Ambulatory Visit (HOSPITAL_COMMUNITY)
Admission: RE | Admit: 2018-05-14 | Discharge: 2018-05-14 | Disposition: A | Discharge status: Home / Self Care | Payer: Medicare HMO | Attending: General Surgery | Admitting: General Surgery

## 2018-05-14 DIAGNOSIS — Z79899 Other long term (current) drug therapy: Secondary | ICD-10-CM | POA: Insufficient documentation

## 2018-05-14 DIAGNOSIS — C50511 Malignant neoplasm of lower-outer quadrant of right female breast: Secondary | ICD-10-CM | POA: Diagnosis not present

## 2018-05-14 DIAGNOSIS — M479 Spondylosis, unspecified: Secondary | ICD-10-CM | POA: Diagnosis not present

## 2018-05-14 DIAGNOSIS — Z886 Allergy status to analgesic agent status: Secondary | ICD-10-CM | POA: Diagnosis not present

## 2018-05-14 DIAGNOSIS — Z6841 Body Mass Index (BMI) 40.0 and over, adult: Secondary | ICD-10-CM | POA: Insufficient documentation

## 2018-05-14 DIAGNOSIS — M19011 Primary osteoarthritis, right shoulder: Secondary | ICD-10-CM | POA: Diagnosis not present

## 2018-05-14 DIAGNOSIS — Z95828 Presence of other vascular implants and grafts: Secondary | ICD-10-CM

## 2018-05-14 DIAGNOSIS — Z803 Family history of malignant neoplasm of breast: Secondary | ICD-10-CM | POA: Diagnosis not present

## 2018-05-14 DIAGNOSIS — J45909 Unspecified asthma, uncomplicated: Secondary | ICD-10-CM | POA: Diagnosis not present

## 2018-05-14 DIAGNOSIS — M170 Bilateral primary osteoarthritis of knee: Secondary | ICD-10-CM | POA: Diagnosis not present

## 2018-05-14 DIAGNOSIS — M19012 Primary osteoarthritis, left shoulder: Secondary | ICD-10-CM | POA: Insufficient documentation

## 2018-05-14 DIAGNOSIS — C50911 Malignant neoplasm of unspecified site of right female breast: Secondary | ICD-10-CM | POA: Diagnosis not present

## 2018-05-14 DIAGNOSIS — Z452 Encounter for adjustment and management of vascular access device: Secondary | ICD-10-CM | POA: Diagnosis not present

## 2018-05-14 HISTORY — PX: PORTACATH PLACEMENT: SHX2246

## 2018-05-14 SURGERY — INSERTION, TUNNELED CENTRAL VENOUS DEVICE, WITH PORT
Anesthesia: General | Site: Chest | Laterality: Left

## 2018-05-14 MED ORDER — GABAPENTIN 300 MG PO CAPS
ORAL_CAPSULE | ORAL | Status: AC
  Administered 2018-05-14: 300 mg via ORAL
  Filled 2018-05-14: qty 1

## 2018-05-14 MED ORDER — ACETAMINOPHEN 500 MG PO TABS
ORAL_TABLET | ORAL | Status: AC
  Administered 2018-05-14: 1000 mg via ORAL
  Filled 2018-05-14: qty 2

## 2018-05-14 MED ORDER — ONDANSETRON HCL 4 MG/2ML IJ SOLN
INTRAMUSCULAR | Status: AC
  Filled 2018-05-14: qty 2

## 2018-05-14 MED ORDER — LIDOCAINE-EPINEPHRINE 1 %-1:100000 IJ SOLN
INTRAMUSCULAR | Status: DC | PRN
  Administered 2018-05-14: 12.5 mL

## 2018-05-14 MED ORDER — ACETAMINOPHEN 500 MG PO TABS
1000.0000 mg | ORAL_TABLET | ORAL | Status: AC
  Administered 2018-05-14: 1000 mg via ORAL

## 2018-05-14 MED ORDER — CHLORHEXIDINE GLUCONATE CLOTH 2 % EX PADS: 6.0000 | MEDICATED_PAD | Freq: Once | CUTANEOUS | Status: DC

## 2018-05-14 MED ORDER — DEXAMETHASONE SODIUM PHOSPHATE 10 MG/ML IJ SOLN
INTRAMUSCULAR | Status: AC
  Filled 2018-05-14: qty 1

## 2018-05-14 MED ORDER — ALBUTEROL SULFATE HFA 108 (90 BASE) MCG/ACT IN AERS
INHALATION_SPRAY | RESPIRATORY_TRACT | Status: DC | PRN
  Administered 2018-05-14: 4 via RESPIRATORY_TRACT

## 2018-05-14 MED ORDER — LIDOCAINE HCL (CARDIAC) PF 100 MG/5ML IV SOSY
PREFILLED_SYRINGE | INTRAVENOUS | Status: DC | PRN
  Administered 2018-05-14: 100 mg via INTRATRACHEAL

## 2018-05-14 MED ORDER — FENTANYL CITRATE (PF) 100 MCG/2ML IJ SOLN: 25.0000 ug | INTRAMUSCULAR | Status: DC | PRN

## 2018-05-14 MED ORDER — HEPARIN SOD (PORK) LOCK FLUSH 100 UNIT/ML IV SOLN
INTRAVENOUS | Status: DC | PRN
  Administered 2018-05-14: 500 [IU]

## 2018-05-14 MED ORDER — LACTATED RINGERS IV SOLN
INTRAVENOUS | Status: DC | PRN
  Administered 2018-05-14: 08:00:00 via INTRAVENOUS

## 2018-05-14 MED ORDER — BUPIVACAINE HCL (PF) 0.25 % IJ SOLN
INTRAMUSCULAR | Status: AC
  Filled 2018-05-14: qty 30

## 2018-05-14 MED ORDER — ROCURONIUM BROMIDE 50 MG/5ML IV SOSY
PREFILLED_SYRINGE | INTRAVENOUS | Status: AC
  Filled 2018-05-14: qty 5

## 2018-05-14 MED ORDER — MIDAZOLAM HCL 2 MG/2ML IJ SOLN
INTRAMUSCULAR | Status: DC | PRN
  Administered 2018-05-14: 2 mg via INTRAVENOUS

## 2018-05-14 MED ORDER — DEXAMETHASONE SODIUM PHOSPHATE 10 MG/ML IJ SOLN
INTRAMUSCULAR | Status: DC | PRN
  Administered 2018-05-14: 5 mg via INTRAVENOUS

## 2018-05-14 MED ORDER — SUGAMMADEX SODIUM 200 MG/2ML IV SOLN
INTRAVENOUS | Status: DC | PRN
  Administered 2018-05-14: 274 mg via INTRAVENOUS

## 2018-05-14 MED ORDER — FENTANYL CITRATE (PF) 250 MCG/5ML IJ SOLN
INTRAMUSCULAR | Status: DC | PRN
  Administered 2018-05-14: 50 ug via INTRAVENOUS

## 2018-05-14 MED ORDER — ONDANSETRON HCL 4 MG/2ML IJ SOLN: 4.0000 mg | Freq: Once | INTRAMUSCULAR | Status: DC | PRN

## 2018-05-14 MED ORDER — SODIUM CHLORIDE 0.9 % IV SOLN
INTRAVENOUS | Status: AC
  Filled 2018-05-14: qty 1.2

## 2018-05-14 MED ORDER — FENTANYL CITRATE (PF) 250 MCG/5ML IJ SOLN
INTRAMUSCULAR | Status: AC
  Filled 2018-05-14: qty 5

## 2018-05-14 MED ORDER — ONDANSETRON HCL 4 MG/2ML IJ SOLN
INTRAMUSCULAR | Status: DC | PRN
  Administered 2018-05-14: 4 mg via INTRAVENOUS

## 2018-05-14 MED ORDER — 0.9 % SODIUM CHLORIDE (POUR BTL) OPTIME
TOPICAL | Status: DC | PRN
  Administered 2018-05-14: 1000 mL

## 2018-05-14 MED ORDER — LIDOCAINE HCL (PF) 1 % IJ SOLN
INTRAMUSCULAR | Status: AC
  Filled 2018-05-14: qty 30

## 2018-05-14 MED ORDER — MIDAZOLAM HCL 2 MG/2ML IJ SOLN
INTRAMUSCULAR | Status: AC
  Filled 2018-05-14: qty 2

## 2018-05-14 MED ORDER — PROPOFOL 10 MG/ML IV BOLUS
INTRAVENOUS | Status: AC
  Filled 2018-05-14: qty 40

## 2018-05-14 MED ORDER — OXYCODONE HCL 5 MG PO TABS
5.0000 mg | ORAL_TABLET | Freq: Once | ORAL | Status: AC | PRN
  Administered 2018-05-14: 5 mg via ORAL

## 2018-05-14 MED ORDER — OXYCODONE HCL 5 MG/5ML PO SOLN: 5.0000 mg | Freq: Once | ORAL | Status: AC | PRN

## 2018-05-14 MED ORDER — LIDOCAINE-EPINEPHRINE 1 %-1:100000 IJ SOLN
INTRAMUSCULAR | Status: AC
  Filled 2018-05-14: qty 1

## 2018-05-14 MED ORDER — PROPOFOL 10 MG/ML IV BOLUS
INTRAVENOUS | Status: DC | PRN
  Administered 2018-05-14: 200 mg via INTRAVENOUS

## 2018-05-14 MED ORDER — GABAPENTIN 300 MG PO CAPS
300.0000 mg | ORAL_CAPSULE | ORAL | Status: AC
  Administered 2018-05-14: 300 mg via ORAL

## 2018-05-14 MED ORDER — SODIUM CHLORIDE 0.9 % IV SOLN
INTRAVENOUS | Status: DC | PRN
  Administered 2018-05-14: 08:00:00

## 2018-05-14 MED ORDER — BUPIVACAINE HCL (PF) 0.25 % IJ SOLN
INTRAMUSCULAR | Status: DC | PRN
  Administered 2018-05-14: 12.5 mL

## 2018-05-14 MED ORDER — HEPARIN SOD (PORK) LOCK FLUSH 100 UNIT/ML IV SOLN
INTRAVENOUS | Status: AC
  Filled 2018-05-14: qty 5

## 2018-05-14 MED ORDER — OXYCODONE HCL 5 MG PO TABS
ORAL_TABLET | ORAL | Status: AC
  Filled 2018-05-14: qty 1

## 2018-05-14 SURGICAL SUPPLY — 43 items
ADH SKN CLS APL DERMABOND .7 (GAUZE/BANDAGES/DRESSINGS) ×1
BAG DECANTER FOR FLEXI CONT (MISCELLANEOUS) ×3 IMPLANT
BLADE HEX COATED 2.75 (ELECTRODE) ×3 IMPLANT
CANISTER SUCT 3000ML PPV (MISCELLANEOUS) IMPLANT
CHLORAPREP W/TINT 10.5 ML (MISCELLANEOUS) ×3 IMPLANT
COVER SURGICAL LIGHT HANDLE (MISCELLANEOUS) ×3 IMPLANT
COVER TRANSDUCER ULTRASND GEL (DRAPE) IMPLANT
COVER WAND RF STERILE (DRAPES) ×3 IMPLANT
CRADLE DONUT ADULT HEAD (MISCELLANEOUS) ×3 IMPLANT
DECANTER SPIKE VIAL GLASS SM (MISCELLANEOUS) ×6 IMPLANT
DERMABOND ADVANCED (GAUZE/BANDAGES/DRESSINGS) ×2
DERMABOND ADVANCED .7 DNX12 (GAUZE/BANDAGES/DRESSINGS) ×1 IMPLANT
DRAPE C-ARM 42X72 X-RAY (DRAPES) ×3 IMPLANT
DRAPE CHEST BREAST 15X10 FENES (DRAPES) ×3 IMPLANT
DRAPE WARM FLUID 44X44 (DRAPE) IMPLANT
DRSG TEGADERM 4X4.75 (GAUZE/BANDAGES/DRESSINGS) ×4 IMPLANT
ELECT REM PT RETURN 9FT ADLT (ELECTROSURGICAL) ×3
ELECTRODE REM PT RTRN 9FT ADLT (ELECTROSURGICAL) ×1 IMPLANT
GAUZE 4X4 16PLY RFD (DISPOSABLE) ×3 IMPLANT
GAUZE SPONGE 4X4 12PLY STRL (GAUZE/BANDAGES/DRESSINGS) ×2 IMPLANT
GEL ULTRASOUND 20GR AQUASONIC (MISCELLANEOUS) IMPLANT
GLOVE BIO SURGEON STRL SZ 6 (GLOVE) ×3 IMPLANT
GLOVE INDICATOR 6.5 STRL GRN (GLOVE) ×3 IMPLANT
GOWN STRL REUS W/ TWL LRG LVL3 (GOWN DISPOSABLE) ×1 IMPLANT
GOWN STRL REUS W/TWL 2XL LVL3 (GOWN DISPOSABLE) ×3 IMPLANT
GOWN STRL REUS W/TWL LRG LVL3 (GOWN DISPOSABLE) ×3
KIT BASIN OR (CUSTOM PROCEDURE TRAY) ×3 IMPLANT
KIT PORT POWER 8FR ISP CVUE (Port) ×2 IMPLANT
KIT TURNOVER KIT B (KITS) ×3 IMPLANT
NS IRRIG 1000ML POUR BTL (IV SOLUTION) ×3 IMPLANT
PAD ARMBOARD 7.5X6 YLW CONV (MISCELLANEOUS) ×3 IMPLANT
PENCIL BUTTON HOLSTER BLD 10FT (ELECTRODE) ×3 IMPLANT
SUT MON AB 4-0 PC3 18 (SUTURE) ×3 IMPLANT
SUT PROLENE 2 0 SH DA (SUTURE) ×6 IMPLANT
SUT VIC AB 3-0 SH 27 (SUTURE) ×3
SUT VIC AB 3-0 SH 27X BRD (SUTURE) ×1 IMPLANT
SYR 5ML LUER SLIP (SYRINGE) ×3 IMPLANT
TOWEL OR 17X24 6PK STRL BLUE (TOWEL DISPOSABLE) ×3 IMPLANT
TOWEL OR 17X26 10 PK STRL BLUE (TOWEL DISPOSABLE) ×3 IMPLANT
TRAY LAPAROSCOPIC MC (CUSTOM PROCEDURE TRAY) ×3 IMPLANT
TUBE CONNECTING 12'X1/4 (SUCTIONS)
TUBE CONNECTING 12X1/4 (SUCTIONS) IMPLANT
YANKAUER SUCT BULB TIP NO VENT (SUCTIONS) IMPLANT

## 2018-05-14 NOTE — Anesthesia Postprocedure Evaluation (Signed)
Anesthesia Post Note  Patient: MELANNY WIRE  Procedure(s) Performed: INSERTION PORT-A-CATH (Left Chest)     Patient location during evaluation: PACU Anesthesia Type: General Level of consciousness: awake and alert Pain management: pain level controlled Vital Signs Assessment: post-procedure vital signs reviewed and stable Respiratory status: spontaneous breathing, nonlabored ventilation and respiratory function stable Cardiovascular status: blood pressure returned to baseline and stable Postop Assessment: no apparent nausea or vomiting Anesthetic complications: no    Last Vitals:  Vitals:   05/14/18 0611 05/14/18 0854  BP: (!) 169/98 (!) 158/87  Pulse: 82 80  Resp: 18 15  Temp: 37.1 C 36.5 C  SpO2: 97% 100%    Last Pain:  Vitals:   05/14/18 0854  TempSrc:   PainSc: 0-No pain                 Lidia Collum

## 2018-05-14 NOTE — Transfer of Care (Signed)
Immediate Anesthesia Transfer of Care Note  Patient: Kristina Rivera  Procedure(s) Performed: INSERTION PORT-A-CATH (Left Chest)  Patient Location: PACU  Anesthesia Type:General  Level of Consciousness: awake and patient cooperative  Airway & Oxygen Therapy: Patient Spontanous Breathing and Patient connected to face mask oxygen  Post-op Assessment: Report given to RN and Post -op Vital signs reviewed and stable  Post vital signs: Reviewed and stable  Last Vitals:  Vitals Value Taken Time  BP    Temp    Pulse 80 05/14/2018  8:54 AM  Resp    SpO2 100 % 05/14/2018  8:54 AM  Vitals shown include unvalidated device data.  Last Pain:  Vitals:   05/14/18 0611  TempSrc: Oral         Complications: No apparent anesthesia complications

## 2018-05-14 NOTE — Op Note (Signed)
PREOPERATIVE DIAGNOSIS:  Right breast cancer     POSTOPERATIVE DIAGNOSIS:  Same     PROCEDURE: Left subclavian port placement, Bard ClearVue  Power Port, MRI safe, 8-French.      SURGEON:  Stark Klein, MD      ANESTHESIA:  General   FINDINGS:  Good venous return, easy flush, and tip of the catheter and   SVC 24 cm.      SPECIMEN:  None.      ESTIMATED BLOOD LOSS:  Minimal.      COMPLICATIONS:  None known.      PROCEDURE:  Pt was identified in the holding area and taken to   the operating room, where patient was placed supine on the operating room   table.  General anesthesia was induced.  Patient's arms were tucked and the upper   chest and neck were prepped and draped in sterile fashion.  Time-out was   performed according to the surgical safety check list.  When all was   correct, we continued.   Local anesthetic was administered over this   area at the angle of the clavicle.  The vein was accessed with 1 pass(es) of the needle. There was good venous return and the wire passed easily with no ectopy.   Fluoroscopy was used to confirm that the wire was in the vena cava.      The patient was placed back level and the area for the pocket was anethetized   with local anesthetic.  A 3-cm transverse incision was made with a #15   blade.  Cautery was used to divide the subcutaneous tissues down to the   pectoralis muscle.  An [employer] retractor was used to elevate the skin   while a pocket was created on top of the pectoralis fascia.  The port   was placed into the pocket to confirm that it was of adequate size.  The   catheter was preattached to the port.  The port was then secured to the   pectoralis fascia with four 2-0 Prolene sutures.  These were clamped and   not tied down yet.    The catheter was tunneled through to the wire exit   site.  The catheter was placed along the wire to determine what length it should be to be in the SVC.  The catheter was cut at 24 cm.  The  tunneler sheath and dilator were passed over the wire and the dilator and wire were removed.  The catheter was advanced through the tunneler sheath and the tunneler sheath was pulled away.  Care was taken to keep the catheter in the tunneler sheath as this occurred. This was advanced and the tunneler sheath was removed.  There was good venous   return and easy flush of the catheter.  The Prolene sutures were tied   down to the pectoral fascia.  The skin was reapproximated using 3-0   Vicryl interrupted deep dermal sutures.    Fluoroscopy was used to re-confirm good position of the catheter.  The skin   was then closed using 4-0 Monocryl in a subcuticular fashion.  The port was flushed with concentrated heparin flush as well.  The wounds were then cleaned, dried, and dressed with Dermabond.  The patient was awakened from anesthesia and taken to the PACU in stable condition.  Needle, sponge, and instrument counts were correct.               Stark Klein, MD

## 2018-05-14 NOTE — H&P (Signed)
Kristina Rivera is an 58 y.o. female.   Chief Complaint: breast cancer HPI:  Pt presents after lumpectomy and SLN bx and has had discussions regarding port placement.  She will need chemotherapy.    Past Medical History:  Diagnosis Date  . Anxiety   . Arthritis    "back; knees; shoulders" (04/19/2013)  . Asthma   . Chronic lower back pain   . Daily headache    "last couple months" (04/19/2013)  . Depression   . Family history of breast cancer   . Family history of lung cancer     Past Surgical History:  Procedure Laterality Date  . BREAST LUMPECTOMY WITH AXILLARY LYMPH NODE BIOPSY Right 03/28/2018   Procedure: RIGHT BREAST LUMPECTOMY WITH RIGHT SENTINEL LYMPH NODE BIOPSY;  Surgeon: Stark Klein, MD;  Location: [facility];  Service: General;  Laterality: Right;  . CARPAL TUNNEL RELEASE Left 1999?  . TUBAL LIGATION  1993    Family History  Problem Relation Age of Onset  . Breast cancer Maternal Aunt 37  . Pneumonia Mother   . Lung cancer Mother 58  . Stroke Father   . Head & neck cancer Sister 30       neck  . Prostate cancer Neg Hx    Social History:  reports that she has never smoked. She has never used smokeless tobacco. She reports that she does not drink alcohol or use drugs.  Allergies:  Allergies  Allergen Reactions  . Aspirin Anaphylaxis and Hives  . Nsaids Anaphylaxis    Medications Prior to Admission  Medication Sig Dispense Refill  . acetaminophen (TYLENOL) 650 MG CR tablet Take 1,300 mg by mouth every 8 (eight) hours as needed for pain.    Marland Kitchen albuterol (VENTOLIN HFA) 108 (90 BASE) MCG/ACT inhaler Inhale 2 puffs into the lungs as needed. (Patient taking differently: Inhale 2 puffs into the lungs every 6 (six) hours as needed for wheezing or shortness of breath. ) 1 Inhaler 6  . Liniments (PAIN RELIEF EX) Apply 1 application topically 4 (four) times daily as needed (pain.). Amish Origins Deep Penetrating Pain Relief Ointment    . Multiple Vitamin (MULTIVITAMIN) LIQD  Take 5 mLs by mouth daily. Nutraburst multi vitamin liquid    . OVER THE COUNTER MEDICATION Take 1 capsule by mouth daily with lunch. Siberian Chaga Extract    . prochlorperazine (COMPAZINE) 10 MG tablet Take 1 tablet (10 mg total) by mouth 3 (three) times daily before meals. 30 tablet 1  . lidocaine-prilocaine (EMLA) cream Apply to affected area once 30 g 3  . oxyCODONE (OXY IR/ROXICODONE) 5 MG immediate release tablet Take 1 tablet (5 mg total) by mouth every 6 (six) hours as needed for severe pain. (Patient not taking: Reported on 05/04/2018) 20 tablet 0    No results found for this or any previous visit (from the past 48 hour(s)). No results found.  Review of Systems  All other systems reviewed and are negative.   Blood pressure (!) 169/98, pulse 82, temperature 98.8 F (37.1 C), temperature source Oral, resp. rate 18, height 5\' 2"  (1.575 m), weight (!) 137 kg, SpO2 97 %. Physical Exam  Constitutional: She is oriented to person, place, and time. She appears well-developed and well-nourished. No distress.  HENT:  Head: Normocephalic and atraumatic.  Eyes: Pupils are equal, round, and reactive to light. Conjunctivae are normal. No scleral icterus.  Neck: Neck supple.  Cardiovascular: Normal rate.  Respiratory: Effort normal.  GI: Soft.  Neurological: She  is alert and oriented to person, place, and time. Coordination normal.  Skin: Skin is warm and dry. No rash noted. She is not diaphoretic. No erythema. No pallor.  Psychiatric: She has a normal mood and affect. Her behavior is normal. Judgment and thought content normal.     Assessment/Plan Right breast cancer Needs chemo Reviewed port placement.   Pt in agreement.   Stark Klein, MD 05/14/2018, 7:26 AM

## 2018-05-14 NOTE — Anesthesia Procedure Notes (Signed)
Procedure Name: Intubation Date/Time: 05/14/2018 7:56 AM Performed by: Kathryne Hitch, CRNA Pre-anesthesia Checklist: Patient identified, Emergency Drugs available, Suction available, Patient being monitored and Timeout performed Patient Re-evaluated:Patient Re-evaluated prior to induction Oxygen Delivery Method: Circle system utilized Preoxygenation: Pre-oxygenation with 100% oxygen Induction Type: IV induction Ventilation: Mask ventilation without difficulty and Oral airway inserted - appropriate to patient size Laryngoscope Size: Sabra Heck and 2 Grade View: Grade III Tube type: Oral Tube size: 7.0 mm Number of attempts: 1 Airway Equipment and Method: Stylet and Oral airway Placement Confirmation: ETT inserted through vocal cords under direct vision,  positive ETCO2 and breath sounds checked- equal and bilateral Secured at: 23 cm Tube secured with: Tape Dental Injury: Teeth and Oropharynx as per pre-operative assessment

## 2018-05-15 ENCOUNTER — Other Ambulatory Visit: Payer: Self-pay | Admitting: *Deleted

## 2018-05-15 ENCOUNTER — Telehealth: Payer: Self-pay | Admitting: *Deleted

## 2018-05-15 ENCOUNTER — Other Ambulatory Visit: Payer: Self-pay

## 2018-05-15 ENCOUNTER — Other Ambulatory Visit: Payer: Self-pay | Admitting: Oncology

## 2018-05-15 ENCOUNTER — Encounter (HOSPITAL_COMMUNITY): Payer: Self-pay | Admitting: General Surgery

## 2018-05-15 DIAGNOSIS — Z171 Estrogen receptor negative status [ER-]: Principal | ICD-10-CM

## 2018-05-15 DIAGNOSIS — C50511 Malignant neoplasm of lower-outer quadrant of right female breast: Secondary | ICD-10-CM

## 2018-05-15 NOTE — Progress Notes (Signed)
[name]  [phone] [phone]    ID: Kristina Rivera DOB: [birth year]  MR#: [id number]  JYN#:[id number]  Patient Care Team: Jonathon Jordan, MD as PCP - General (Family Medicine) Alida Greiner, [name], MD as Consulting Physician (Oncology) Stark Klein, MD as Consulting Physician (General Surgery) Thresea Doble, [name], MD as Consulting Physician (Oncology) Gery Pray, MD as Consulting Physician (Radiation Oncology) Chauncey Cruel, MD OTHER MD:   CHIEF COMPLAINT: Triple negative breast cancer  CURRENT TREATMENT: Adjuvant chemotherapy (CMF)   HISTORY OF CURRENT ILLNESS: The original intake note:  Kristina Rivera had palpated a lump in her left breast. She then underwent mammography on 02/23/2018 at [facility] showing a possible abnormality in both the right and left breasts. She underwent bilateral diagnostic mammography with tomography and bilateral breast ultrasonography on 03/01/2018 showing: 3 cm mass in the 8 o'clock position of the right breast highly suspicious for breast malignancy; Two borderline abnormal right axillary lymph nodes with cortical thicknesses of 4 mm; indeterminate mass or lesion in the 2 o'clock retroareolar region of the left breast measuring 6 mm and no left axillary adenopathy.   Accordingly on 03/09/2018 she proceeded to biopsy of the breast areas in question. The pathology from this procedure ([id number]) showed: high grade carcinoma with necrosis in the right breast at 8 o'clock; benign right axilla lymph node; benign breast parenchyma in the left breast at 2 o'clock retroareolar. Prognostic indicators significant for: estrogen receptor, 0% negative and progesterone receptor, 0% negative. Proliferation marker Ki67 at 80%. HER2 equivocal (2+) by immunohistochemistry, but negative by fluorescent in situ hybridization with an initial analysis result with a signals ratio 1.22 and number per cell 4.4. A repeat analysis  was completed showing a signals ratio 1.13 and number per cell 4.43.   She underwent genetic screening on 03/20/2018 with results pending.  The patient's subsequent history is as detailed below.   INTERVAL HISTORY: Kristina Rivera returns today for follow-up and treatment of her triple negative breast cancer. She is accompanied by her husband. She is seen in the treatment area.   She will begin adjuvant chemotherapy will consist of cyclophosphamide, methotrexate and fluorouracil (CMF) given every 21 days x 8. She has nausea medicine at home and is aware on how to take them.   Since her last visit here, she had a port placed on 05/14/2018.     REVIEW OF SYSTEMS: Kristina Rivera notes that she believes her surgery went fairly well. She is having some difficulty sleeping, with the help of oxycodone; she has some constipation from the oxycodone. She has been taking some tylenol for pain in her throat as well. She notes some swelling and the need or new bras. The patient denies unusual headaches, visual changes, nausea, vomiting, or dizziness. There has been no unusual cough, phlegm production, or pleurisy. This been no change in bowel or bladder habits. The patient denies unexplained fatigue or unexplained weight loss, bleeding, rash, or fever. A detailed review of systems was otherwise noncontributory.    PAST MEDICAL HISTORY: Past Medical History:  Diagnosis Date  . Anxiety   . Arthritis    "back; knees; shoulders" (04/19/2013)  . Asthma   . Chronic lower back pain   . Daily headache    "last couple months" (04/19/2013)  . Depression   . Family history of breast cancer   . Family history of lung cancer     PAST SURGICAL HISTORY: Past Surgical History:  Procedure Laterality Date  . BREAST  LUMPECTOMY WITH AXILLARY LYMPH NODE BIOPSY Right 03/28/2018   Procedure: RIGHT BREAST LUMPECTOMY WITH RIGHT SENTINEL LYMPH NODE BIOPSY;  Surgeon: Stark Klein, MD;  Location: [facility];  Service: General;  Laterality: Right;   . CARPAL TUNNEL RELEASE Left 1999?  Marland Kitchen PORTACATH PLACEMENT Left 05/14/2018   Procedure: INSERTION PORT-A-CATH;  Surgeon: Stark Klein, MD;  Location: [facility];  Service: General;  Laterality: Left;  . TUBAL LIGATION  1993    FAMILY HISTORY: Family History  Problem Relation Age of Onset  . Breast cancer Maternal Aunt 94  . Pneumonia Mother   . Lung cancer Mother 11  . Stroke Father   . Head & neck cancer Sister 30       neck  . Prostate cancer Neg Hx    She underwent commercial genetic testing and was told she is 25% of European Jewish ancestry. Patient father was 10 years old when he died from stroke. Patient mother died from pneumonia at age 47.  The patient notes a family hx of breast cancer in a maternal aunt diagnosed with breast cancer at age 41.  The patient has 9 siblings, 4 brothers and 5 sisters.  1 sister was diagnosed with neck cancer at age 11, and patient's mom with lung cancer at age 12.   GYNECOLOGIC HISTORY:  No LMP recorded. Patient is postmenopausal. Menarche: 58 years old Age at first live birth: 57 years old Miller P 5 LMP around age 76 Contraceptive: never HRT: no  Hysterectomy? no So? no   SOCIAL HISTORY: She is on disability due to a back injury. Prior to that, she was a Quarry manager at [employer]. Her husband, Mariea Clonts, is retired. He was a Animal nutritionist. They have 5 children. Pascal Lux, age 59, is a Community education officer in [place], [place]. Orvis Brill, age 33, is a Education officer, museum here in [place]. Wendy Poet, age 70, is a school Engineer, water in [place], [place]. Silverio Decamp., age 46, is a Customer service manager here in [place]. [place], age 68, is a nanny in [place], [place]. She and her husband have two grandchildren, and her husband has 7 grandchildren and 7 great-grandchildren from 2 children from a prior marriage. They attend the [employer].  They tell me their religious affiliation does not  involve any medical restrictions  ADVANCED DIRECTIVES: The patient and her husband are each others healthcare powers of attorney   HEALTH MAINTENANCE: Social History   Tobacco Use  . Smoking status: Never Smoker  . Smokeless tobacco: Never Used  Substance Use Topics  . Alcohol use: No  . Drug use: No     Colonoscopy: 2010, Dr. Vella Kohler?  PAP: 07/2016  Bone density: more than 3 years ago   Allergies  Allergen Reactions  . Aspirin Anaphylaxis and Hives  . Nsaids Anaphylaxis    Current Outpatient Medications  Medication Sig Dispense Refill  . acetaminophen (TYLENOL) 650 MG CR tablet Take 1,300 mg by mouth every 8 (eight) hours as needed for pain.    Marland Kitchen albuterol (VENTOLIN HFA) 108 (90 BASE) MCG/ACT inhaler Inhale 2 puffs into the lungs as needed. (Patient taking differently: Inhale 2 puffs into the lungs every 6 (six) hours as needed for wheezing or shortness of breath. ) 1 Inhaler 6  . lidocaine-prilocaine (EMLA) cream Apply to affected area once 30 g 3  . Liniments (PAIN RELIEF EX) Apply 1 application topically 4 (four) times daily as needed (pain.). Amish Origins Deep  Penetrating Pain Relief Ointment    . Multiple Vitamin (MULTIVITAMIN) LIQD Take 5 mLs by mouth daily. Nutraburst multi vitamin liquid    . OVER THE COUNTER MEDICATION Take 1 capsule by mouth daily with lunch. Siberian Chaga Extract    . oxyCODONE (OXY IR/ROXICODONE) 5 MG immediate release tablet Take 1 tablet (5 mg total) by mouth every 6 (six) hours as needed for severe pain. (Patient not taking: Reported on 05/04/2018) 20 tablet 0  . prochlorperazine (COMPAZINE) 10 MG tablet Take 1 tablet (10 mg total) by mouth 3 (three) times daily before meals. 30 tablet 1   No current facility-administered medications for this visit.     OBJECTIVE: Morbidly obese African-American woman examined in bed For vitals today please review the infusion area flowsheet  There were no vitals filed for this visit.   There is no  height or weight on file to calculate BMI.   Wt Readings from Last 3 Encounters:  05/14/18 (!) 302 lb (137 kg)  05/10/18 (!) 305 lb 14.4 oz (138.8 kg)  05/08/18 (!) 303 lb 11.2 oz (137.8 kg)      ECOG FS:1 - Symptomatic but completely ambulatory  Sclerae unicteric, EOMs intact No cervical or supraclavicular adenopathy Lungs no rales or rhonchi Heart regular rate and rhythm Abd soft, nontender, positive bowel sounds Neuro: nonfocal, well oriented, appropriate affect Breasts: The right breast incision is healing nicely, with minimal induration, no erythema, no dehiscence, and no swelling.  LAB RESULTS:  CMP     Component Value Date/Time   NA 142 05/08/2018 1118   K 4.3 05/08/2018 1118   CL 102 05/08/2018 1118   CO2 30 05/08/2018 1118   GLUCOSE 132 (H) 05/08/2018 1118   BUN 12 05/08/2018 1118   CREATININE 0.94 05/08/2018 1118   CREATININE 0.72 11/23/2012 1616   CALCIUM 9.7 05/08/2018 1118   PROT 8.3 (H) 05/08/2018 1118   ALBUMIN 3.7 05/08/2018 1118   AST 25 05/08/2018 1118   ALT 18 05/08/2018 1118   ALKPHOS 76 05/08/2018 1118   BILITOT 0.7 05/08/2018 1118   GFRNONAA >60 05/08/2018 1118   GFRAA >60 05/08/2018 1118    No results found for: TOTALPROTELP, ALBUMINELP, A1GS, A2GS, BETS, BETA2SER, GAMS, MSPIKE, SPEI  No results found for: KPAFRELGTCHN, LAMBDASER, KAPLAMBRATIO  Lab Results  Component Value Date   WBC 12.1 (H) 05/16/2018   NEUTROABS PENDING 05/16/2018   HGB 12.0 05/16/2018   HCT 40.0 05/16/2018   MCV 73.0 (L) 05/16/2018   PLT 304 05/16/2018    _0 @  No results found for: LABCA2  No components found for: IWPYKD983  No results for input(s): INR in the last 168 hours.  No results found for: LABCA2  No results found for: JAS505  No results found for: LZJ673  No results found for: ALP379  No results found for: CA2729  No components found for: HGQUANT  No results found for: CEA1 / No results found for: CEA1   No results found for:  AFPTUMOR  No results found for: CHROMOGRNA  No results found for: PSA1  Appointment on 05/16/2018  Component Date Value Ref Range Status  . WBC Count 05/16/2018 12.1* 4.0 - 10.5 K/uL Final  . RBC 05/16/2018 5.48* 3.87 - 5.11 MIL/uL Final  . Hemoglobin 05/16/2018 12.0  12.0 - 15.0 g/dL Final  . HCT 05/16/2018 40.0  36.0 - 46.0 % Final  . MCV 05/16/2018 73.0* 80.0 - 100.0 fL Final  . MCH 05/16/2018 21.9* 26.0 - 34.0 pg Final  .  MCHC 05/16/2018 30.0  30.0 - 36.0 g/dL Final  . RDW 05/16/2018 15.5  11.5 - 15.5 % Final  . Platelet Count 05/16/2018 304  150 - 400 K/uL Final  . nRBC 05/16/2018 0.0  0.0 - 0.2 % Final   Performed at [facility], [place] [address], [place], [place] [address]  . Neutrophils Relative % 05/16/2018 PENDING  % Incomplete  . Neutro Abs 05/16/2018 PENDING  1.7 - 7.7 K/uL Incomplete  . Band Neutrophils 05/16/2018 PENDING  % Incomplete  . Lymphocytes Relative 05/16/2018 PENDING  % Incomplete  . Lymphs Abs 05/16/2018 PENDING  0.7 - 4.0 K/uL Incomplete  . Monocytes Relative 05/16/2018 PENDING  % Incomplete  . Monocytes Absolute 05/16/2018 PENDING  0.1 - 1.0 K/uL Incomplete  . Eosinophils Relative 05/16/2018 PENDING  % Incomplete  . Eosinophils Absolute 05/16/2018 PENDING  0.0 - 0.5 K/uL Incomplete  . Basophils Relative 05/16/2018 PENDING  % Incomplete  . Basophils Absolute 05/16/2018 PENDING  0.0 - 0.1 K/uL Incomplete  . WBC Morphology 05/16/2018 PENDING   Incomplete  . RBC Morphology 05/16/2018 PENDING   Incomplete  . Smear Review 05/16/2018 PENDING   Incomplete  . Other 05/16/2018 PENDING  % Incomplete  . nRBC 05/16/2018 PENDING  0 /100 WBC Incomplete  . Metamyelocytes Relative 05/16/2018 PENDING  % Incomplete  . Myelocytes 05/16/2018 PENDING  % Incomplete  . Promyelocytes Relative 05/16/2018 PENDING  % Incomplete  . Blasts 05/16/2018 PENDING  % Incomplete    (this displays the last labs from the last 3 days)  No results found for:  TOTALPROTELP, ALBUMINELP, A1GS, A2GS, BETS, BETA2SER, GAMS, MSPIKE, SPEI (this displays SPEP labs)  No results found for: KPAFRELGTCHN, LAMBDASER, KAPLAMBRATIO (kappa/lambda light chains)  No results found for: HGBA, HGBA2QUANT, HGBFQUANT, HGBSQUAN (Hemoglobinopathy evaluation)   No results found for: LDH  No results found for: IRON, TIBC, IRONPCTSAT (Iron and TIBC)  No results found for: FERRITIN  Urinalysis No results found for: COLORURINE, APPEARANCEUR, LABSPEC, PHURINE, GLUCOSEU, HGBUR, BILIRUBINUR, KETONESUR, PROTEINUR, UROBILINOGEN, NITRITE, LEUKOCYTESUR   STUDIES: Dg Chest Port 1 View  Result Date: 05/14/2018 CLINICAL DATA:  Status post Port-A-Cath placement. EXAM: PORTABLE CHEST 1 VIEW COMPARISON:  04/19/2013 FINDINGS: Left subclavian Port-A-Cath has been placed. Catheter tip in the SVC region. Negative for a pneumothorax. Heart size is enlarged but may be accentuated by the portable AP technique. No large areas of airspace disease or consolidation. Probable atelectasis in the left hilar region. Bone structures are unremarkable. IMPRESSION: Placement of left subclavian Port-A-Cath. Catheter tip in the SVC region. Negative for a pneumothorax. Question cardiomegaly. Electronically Signed   By: Markus Daft M.D.   On: 05/14/2018 09:14   Dg Fluoro Guide Cv Line-no Report  Result Date: 05/14/2018 Fluoroscopy was utilized by the requesting physician.  No radiographic interpretation.     ELIGIBLE FOR AVAILABLE RESEARCH PROTOCOL: no   ASSESSMENT: 58 y.o. Door woman status post right breast lower outer quadrant biopsy 03/09/2018 for a clinical T2N0, stage IIB invasive ductal carcinoma, triple negative, with an MIB-1 of 80%  (1) Genetic testing performed through [facility] Common Hereditary Cancers Panel + EGFR on 03/21/2018 showed no pathogenic mutations in APC, ATM, AXIN2, BARD1, BMPR1A, BRCA1, BRCA2, BRIP1, BUB1B, CDH1, CDK4, CDKN2A, CHEK2, CTNNA1, DICER1, ENG, EPCAM, GALNT12,  GREM1, HOXB13, KIT, MEN1, MLH1, MLH3, MSH2, MSH3, MSH6, MUTYH, NBN, NF1, NTHL1, PALB2, PDGFRA, PMS2, POLD1, POLE, PTEN, RAD50, RAD51C, RAD51D, RNF43, RPS20, SDHA, SDHB, SDHC, SDHD, SMAD4, SMARCA4, STK11, TP53, TSC1, TSC2, VHL.   (a) A  variant of uncertain significance (VUS) in a gene called ATM was also noted. [id number] (p.[id number]).  (2) status post right lumpectomy and sentinel lymph node sampling 03/28/2018 for a pT2 pN0, stage IIB invasive ductal carcinoma, with medullary features, negative margins.  (3) adjuvant chemotherapy will consist of cyclophosphamide, methotrexate and fluorouracil given every 21 days x 8   (3) adjuvant radiation to follow (may be done concurrently with cycles 4 through 6)   PLAN: Kristina Rivera did well with her surgery and is beginning her adjuvant chemotherapy today.  We have been able to minimize any admissions from computers or phones and she is being treated in a separate bedroom, pretty much covered head to toe with a metallic sheet, which will be protecting her.  She has an instruction sheet on how to take her antiestrogen and other supportive medicines.  I have asked her and her husband to write down any unusual symptoms or any problems that may develop over the next week so when she returns to see my 33 assistant on 05/22/2018 she will be able to troubleshoot those problems.  I am going to see her again on day 1 cycle 2 and thereafter we will see her on each treatment cycle until she completes the 8 planned treatments  I reassured her that the sore throat she is having is likely due to her recent surgery and not to an infection.  She knows to call for any other issue that may develop before the next visit.  Rody Keadle, [name], MD  05/16/18 9:09 AM Medical Oncology and Hematology [facility] [address] [place], [place] [address] Tel. [phone]    Fax. [phone]  I, Jacqualyn Posey am acting as a Education administrator for Chauncey Cruel,  MD.   I, Lurline Del MD, have reviewed the above documentation for accuracy and completeness, and I agree with the above.

## 2018-05-15 NOTE — Telephone Encounter (Signed)
This RN spoke with pt her VM wanting to discuss her treatment tomorrow and " any prep I need to do "  Kristina Rivera has sensitivity to radioactive ions - " like an allergy - if I am around too much activity I get headaches and nauseated "  This RN discussed possible use of a bedroom - this would allow only 1 computer and pump in her vicinity. This RN also explained need to use computer for administrating chemo therapy and hopefully can cut it off when not  needed.   She will bring a special blanket and headwear ( deflects ions ) that she will use while in our facility.  She uses [place] salt lamps at home for air purification and will bring one with her that we may be able to use in her environment.  It may be beneficial for the nurse caring for the patient to not have her cell phone in her pocket during her care.  Per end of conversation - Kristina Rivera felt reassured that this office is willing to understand her request " it really helps because so many people just don't want to understand how this can affect me "  This RN informed pt our goal is to provide her the needed care in the best of our ability for her breast cancer diagnosis.    No further needs at this time.  This note will be forwarded to appropriate staff for communication and assistance with above.

## 2018-05-16 ENCOUNTER — Encounter: Payer: Self-pay | Admitting: *Deleted

## 2018-05-16 ENCOUNTER — Inpatient Hospital Stay: Payer: Medicare HMO | Attending: Oncology

## 2018-05-16 ENCOUNTER — Inpatient Hospital Stay: Payer: Medicare HMO

## 2018-05-16 ENCOUNTER — Inpatient Hospital Stay (HOSPITAL_BASED_OUTPATIENT_CLINIC_OR_DEPARTMENT_OTHER): Payer: Medicare HMO | Admitting: Oncology

## 2018-05-16 VITALS — BP 139/98 | HR 74 | Temp 98.0°F | Resp 20

## 2018-05-16 DIAGNOSIS — C50511 Malignant neoplasm of lower-outer quadrant of right female breast: Secondary | ICD-10-CM | POA: Diagnosis not present

## 2018-05-16 DIAGNOSIS — Z6841 Body Mass Index (BMI) 40.0 and over, adult: Secondary | ICD-10-CM

## 2018-05-16 DIAGNOSIS — Z5111 Encounter for antineoplastic chemotherapy: Secondary | ICD-10-CM | POA: Insufficient documentation

## 2018-05-16 DIAGNOSIS — Z171 Estrogen receptor negative status [ER-]: Secondary | ICD-10-CM

## 2018-05-16 LAB — CBC WITH DIFFERENTIAL (CANCER CENTER ONLY)
ABS IMMATURE GRANULOCYTES: 0.05 10*3/uL (ref 0.00–0.07)
BASOS PCT: 1 %
Basophils Absolute: 0.1 10*3/uL (ref 0.0–0.1)
Eosinophils Absolute: 0.3 10*3/uL (ref 0.0–0.5)
Eosinophils Relative: 3 %
HCT: 40 % (ref 36.0–46.0)
Hemoglobin: 12 g/dL (ref 12.0–15.0)
Immature Granulocytes: 0 %
Lymphocytes Relative: 50 %
Lymphs Abs: 6 10*3/uL — ABNORMAL HIGH (ref 0.7–4.0)
MCH: 21.9 pg — ABNORMAL LOW (ref 26.0–34.0)
MCHC: 30 g/dL (ref 30.0–36.0)
MCV: 73 fL — ABNORMAL LOW (ref 80.0–100.0)
Monocytes Absolute: 0.6 10*3/uL (ref 0.1–1.0)
Monocytes Relative: 5 %
NEUTROS ABS: 5 10*3/uL (ref 1.7–7.7)
Neutrophils Relative %: 41 %
Platelet Count: 304 10*3/uL (ref 150–400)
RBC: 5.48 MIL/uL — ABNORMAL HIGH (ref 3.87–5.11)
RDW: 15.5 % (ref 11.5–15.5)
WBC Count: 12.1 10*3/uL — ABNORMAL HIGH (ref 4.0–10.5)
nRBC: 0 % (ref 0.0–0.2)

## 2018-05-16 LAB — CMP (CANCER CENTER ONLY)
ALK PHOS: 78 U/L (ref 38–126)
ALT: 15 U/L (ref 0–44)
AST: 18 U/L (ref 15–41)
Albumin: 3.4 g/dL — ABNORMAL LOW (ref 3.5–5.0)
Anion gap: 8 (ref 5–15)
BILIRUBIN TOTAL: 0.4 mg/dL (ref 0.3–1.2)
BUN: 9 mg/dL (ref 6–20)
CO2: 30 mmol/L (ref 22–32)
Calcium: 9.2 mg/dL (ref 8.9–10.3)
Chloride: 105 mmol/L (ref 98–111)
Creatinine: 0.84 mg/dL (ref 0.44–1.00)
GFR, Est AFR Am: >60 mL/min (ref >60)
GFR, Estimated: >60 mL/min (ref >60)
Glucose, Bld: 96 mg/dL (ref 70–99)
Potassium: 4.2 mmol/L (ref 3.5–5.1)
Sodium: 143 mmol/L (ref 135–145)
Total Protein: 8 g/dL (ref 6.5–8.1)

## 2018-05-16 MED ORDER — HEPARIN SOD (PORK) LOCK FLUSH 100 UNIT/ML IV SOLN
500.0000 [IU] | Freq: Once | INTRAVENOUS | Status: AC | PRN
  Administered 2018-05-16: 500 [IU]
  Filled 2018-05-16: qty 5

## 2018-05-16 MED ORDER — PALONOSETRON HCL INJECTION 0.25 MG/5ML
0.2500 mg | Freq: Once | INTRAVENOUS | Status: AC
  Administered 2018-05-16: 0.25 mg via INTRAVENOUS

## 2018-05-16 MED ORDER — SODIUM CHLORIDE 0.9 % IV SOLN
600.0000 mg/m2 | Freq: Once | INTRAVENOUS | Status: AC
  Administered 2018-05-16: 1480 mg via INTRAVENOUS
  Filled 2018-05-16: qty 74

## 2018-05-16 MED ORDER — DEXAMETHASONE SODIUM PHOSPHATE 10 MG/ML IJ SOLN
10.0000 mg | Freq: Once | INTRAMUSCULAR | Status: AC
  Administered 2018-05-16: 10 mg via INTRAVENOUS

## 2018-05-16 MED ORDER — LORAZEPAM 2 MG/ML IJ SOLN
INTRAMUSCULAR | Status: AC
  Filled 2018-05-16: qty 1

## 2018-05-16 MED ORDER — SODIUM CHLORIDE 0.9% FLUSH
10.0000 mL | INTRAVENOUS | Status: DC | PRN
  Administered 2018-05-16: 10 mL
  Filled 2018-05-16: qty 10

## 2018-05-16 MED ORDER — PALONOSETRON HCL INJECTION 0.25 MG/5ML
INTRAVENOUS | Status: AC
  Filled 2018-05-16: qty 5

## 2018-05-16 MED ORDER — METHOTREXATE SODIUM (PF) CHEMO INJECTION 250 MG/10ML
40.6000 mg/m2 | Freq: Once | INTRAMUSCULAR | Status: AC
  Administered 2018-05-16: 100 mg via INTRAVENOUS
  Filled 2018-05-16: qty 4

## 2018-05-16 MED ORDER — LORAZEPAM 2 MG/ML IJ SOLN
0.5000 mg | Freq: Once | INTRAMUSCULAR | Status: AC
  Administered 2018-05-16: 0.5 mg via INTRAVENOUS

## 2018-05-16 MED ORDER — FLUOROURACIL CHEMO INJECTION 2.5 GM/50ML
600.0000 mg/m2 | Freq: Once | INTRAVENOUS | Status: AC
  Administered 2018-05-16: 1500 mg via INTRAVENOUS
  Filled 2018-05-16: qty 30

## 2018-05-16 MED ORDER — SODIUM CHLORIDE 0.9 % IV SOLN
Freq: Once | INTRAVENOUS | Status: AC
  Administered 2018-05-16: 09:00:00 via INTRAVENOUS
  Filled 2018-05-16: qty 250

## 2018-05-16 MED ORDER — DEXAMETHASONE SODIUM PHOSPHATE 10 MG/ML IJ SOLN
INTRAMUSCULAR | Status: AC
  Filled 2018-05-16: qty 1

## 2018-05-16 NOTE — Patient Instructions (Addendum)
[facility] Discharge Instructions for Patients Receiving Chemotherapy  Today you received the following chemotherapy agents: cyclophosphamide (Cytoxan), methotrexate, 5-fluorouracil.   To help prevent nausea and vomiting after your treatment, we encourage you to take your nausea medication as directed by your physician.   If you develop nausea and vomiting that is not controlled by your nausea medication, call the clinic.   BELOW ARE SYMPTOMS THAT SHOULD BE REPORTED IMMEDIATELY:  *FEVER GREATER THAN 100.5 F  *CHILLS WITH OR WITHOUT FEVER  NAUSEA AND VOMITING THAT IS NOT CONTROLLED WITH YOUR NAUSEA MEDICATION  *UNUSUAL SHORTNESS OF BREATH  *UNUSUAL BRUISING OR BLEEDING  TENDERNESS IN MOUTH AND THROAT WITH OR WITHOUT PRESENCE OF ULCERS  *URINARY PROBLEMS  *BOWEL PROBLEMS  UNUSUAL RASH Items with * indicate a potential emergency and should be followed up as soon as possible.  Feel free to call the clinic should you have any questions or concerns. The clinic phone number is [phone].  Please show the CHEMO ALERT CARD at check-in to the Emergency Department and triage nurse.  Cyclophosphamide injection What is this medicine? CYCLOPHOSPHAMIDE (sye kloe FOSS fa mide) is a chemotherapy drug. It slows the growth of cancer cells. This medicine is used to treat many types of cancer like lymphoma, myeloma, leukemia, breast cancer, and ovarian cancer, to name a few. This medicine may be used for other purposes; ask your health care provider or pharmacist if you have questions. COMMON BRAND NAME(S): Cytoxan, Neosar What should I tell my health care provider before I take this medicine? They need to know if you have any of these conditions: -blood disorders -history of other chemotherapy -infection -kidney disease -liver disease -recent or ongoing radiation therapy -tumors in the bone marrow -an unusual or allergic reaction to cyclophosphamide, other  chemotherapy, other medicines, foods, dyes, or preservatives -pregnant or trying to get pregnant -breast-feeding How should I use this medicine? This drug is usually given as an injection into a vein or muscle or by infusion into a vein. It is administered in a hospital or clinic by a specially trained health care professional. Talk to your pediatrician regarding the use of this medicine in children. Special care may be needed. Overdosage: If you think you have taken too much of this medicine contact a poison control center or emergency room at once. NOTE: This medicine is only for you. Do not share this medicine with others. What if I miss a dose? It is important not to miss your dose. Call your doctor or health care professional if you are unable to keep an appointment. What may interact with this medicine? This medicine may interact with the following medications: -amiodarone -amphotericin B -azathioprine -certain antiviral medicines for HIV or AIDS such as protease inhibitors (e.g., indinavir, ritonavir) and zidovudine -certain blood pressure medications such as benazepril, captopril, enalapril, fosinopril, lisinopril, moexipril, monopril, perindopril, quinapril, ramipril, trandolapril -certain cancer medications such as anthracyclines (e.g., daunorubicin, doxorubicin), busulfan, cytarabine, paclitaxel, pentostatin, tamoxifen, trastuzumab -certain diuretics such as chlorothiazide, chlorthalidone, hydrochlorothiazide, indapamide, metolazone -certain medicines that treat or prevent blood clots like warfarin -certain muscle relaxants such as succinylcholine -cyclosporine -etanercept -indomethacin -medicines to increase blood counts like filgrastim, pegfilgrastim, sargramostim -medicines used as general anesthesia -metronidazole -natalizumab This list may not describe all possible interactions. Give your health care provider a list of all the medicines, herbs, non-prescription drugs, or  dietary supplements you use. Also tell them if you smoke, drink alcohol, or use illegal drugs. Some items may interact with your   medicine. What should I watch for while using this medicine? Visit your doctor for checks on your progress. This drug may make you feel generally unwell. This is not uncommon, as chemotherapy can affect healthy cells as well as cancer cells. Report any side effects. Continue your course of treatment even though you feel ill unless your doctor tells you to stop. Drink water or other fluids as directed. Urinate often, even at night. In some cases, you may be given additional medicines to help with side effects. Follow all directions for their use. Call your doctor or health care professional for advice if you get a fever, chills or sore throat, or other symptoms of a cold or flu. Do not treat yourself. This drug decreases your body's ability to fight infections. Try to avoid being around people who are sick. This medicine may increase your risk to bruise or bleed. Call your doctor or health care professional if you notice any unusual bleeding. Be careful brushing and flossing your teeth or using a toothpick because you may get an infection or bleed more easily. If you have any dental work done, tell your dentist you are receiving this medicine. You may get drowsy or dizzy. Do not drive, use machinery, or do anything that needs mental alertness until you know how this medicine affects you. Do not become pregnant while taking this medicine or for 1 year after stopping it. Women should inform their doctor if they wish to become pregnant or think they might be pregnant. Men should not father a child while taking this medicine and for 4 months after stopping it. There is a potential for serious side effects to an unborn child. Talk to your health care professional or pharmacist for more information. Do not breast-feed an infant while taking this medicine. This medicine may interfere  with the ability to have a child. This medicine has caused ovarian failure in some women. This medicine has caused reduced sperm counts in some men. You should talk with your doctor or health care professional if you are concerned about your fertility. If you are going to have surgery, tell your doctor or health care professional that you have taken this medicine. What side effects may I notice from receiving this medicine? Side effects that you should report to your doctor or health care professional as soon as possible: -allergic reactions like skin rash, itching or hives, swelling of the face, lips, or tongue -low blood counts - this medicine may decrease the number of white blood cells, red blood cells and platelets. You may be at increased risk for infections and bleeding. -signs of infection - fever or chills, cough, sore throat, pain or difficulty passing urine -signs of decreased platelets or bleeding - bruising, pinpoint red spots on the skin, black, tarry stools, blood in the urine -signs of decreased red blood cells - unusually weak or tired, fainting spells, lightheadedness -breathing problems -dark urine -dizziness -palpitations -swelling of the ankles, feet, hands -trouble passing urine or change in the amount of urine -weight gain -yellowing of the eyes or skin Side effects that usually do not require medical attention (report to your doctor or health care professional if they continue or are bothersome): -changes in nail or skin color -hair loss -missed menstrual periods -mouth sores -nausea, vomiting This list may not describe all possible side effects. Call your doctor for medical advice about side effects. You may report side effects to FDA at 1-800-FDA-1088. Where should I keep my medicine? This drug   is given in a hospital or clinic and will not be stored at home. NOTE: This sheet is a summary. It may not cover all possible information. If you have questions about this  medicine, talk to your doctor, pharmacist, or health care provider.  2019 Elsevier/Gold Standard (2012-01-13 16:22:58)  Methotrexate injection What is this medicine? METHOTREXATE (METH oh TREX ate) is a chemotherapy drug used to treat cancer including breast cancer, leukemia, and lymphoma. This medicine can also be used to treat psoriasis and certain kinds of arthritis. This medicine may be used for other purposes; ask your health care provider or pharmacist if you have questions. What should I tell my health care provider before I take this medicine? They need to know if you have any of these conditions: -fluid in the stomach area or lungs -if you often drink alcohol -infection or immune system problems -kidney disease -liver disease -low blood counts, like low white cell, platelet, or red cell counts -lung disease -radiation therapy -stomach ulcers -ulcerative colitis -an unusual or allergic reaction to methotrexate, other medicines, foods, dyes, or preservatives -pregnant or trying to get pregnant -breast-feeding How should I use this medicine? This medicine is for infusion into a vein or for injection into muscle or into the spinal fluid (whichever applies). It is usually given by a health care professional in a hospital or clinic setting. In rare cases, you might get this medicine at home. You will be taught how to give this medicine. Use exactly as directed. Take your medicine at regular intervals. Do not take your medicine more often than directed. If this medicine is used for arthritis or psoriasis, it should be taken weekly, NOT daily. It is important that you put your used needles and syringes in a special sharps container. Do not put them in a trash can. If you do not have a sharps container, call your pharmacist or healthcare provider to get one. Talk to your pediatrician regarding the use of this medicine in children. While this drug may be prescribed for children as young as  2 years for selected conditions, precautions do apply. Overdosage: If you think you have taken too much of this medicine contact a poison control center or emergency room at once. NOTE: This medicine is only for you. Do not share this medicine with others. What if I miss a dose? It is important not to miss your dose. Call your doctor or health care professional if you are unable to keep an appointment. If you give yourself the medicine and you miss a dose, talk with your doctor or health care professional. Do not take double or extra doses. What may interact with this medicine? This medicine may interact with the following medications: -acitretin -aspirin or aspirin-like medicines including salicylates -azathioprine -certain antibiotics like chloramphenicol, penicillin, tetracycline -certain medicines for stomach problems like esomeprazole, omeprazole, pantoprazole -cyclosporine -gold -hydroxychloroquine -live virus vaccines -mercaptopurine -NSAIDs, medicines for pain and inflammation, like ibuprofen or naproxen -other cytotoxic agents -penicillamine -phenylbutazone -phenytoin -probenacid -retinoids such as isotretinoin and tretinoin -steroid medicines like prednisone or cortisone -sulfonamides like sulfasalazine and trimethoprim/sulfamethoxazole -theophylline This list may not describe all possible interactions. Give your health care provider a list of all the medicines, herbs, non-prescription drugs, or dietary supplements you use. Also tell them if you smoke, drink alcohol, or use illegal drugs. Some items may interact with your medicine. What should I watch for while using this medicine? Avoid alcoholic drinks. In some cases, you may be given additional medicines to   help with side effects. Follow all directions for their use. This medicine can make you more sensitive to the sun. Keep out of the sun. If you cannot avoid being in the sun, wear protective clothing and use sunscreen.  Do not use sun lamps or tanning beds/booths. You may get drowsy or dizzy. Do not drive, use machinery, or do anything that needs mental alertness until you know how this medicine affects you. Do not stand or sit up quickly, especially if you are an older patient. This reduces the risk of dizzy or fainting spells. You may need blood work done while you are taking this medicine. Call your doctor or health care professional for advice if you get a fever, chills or sore throat, or other symptoms of a cold or flu. Do not treat yourself. This drug decreases your body's ability to fight infections. Try to avoid being around people who are sick. This medicine may increase your risk to bruise or bleed. Call your doctor or health care professional if you notice any unusual bleeding. Check with your doctor or health care professional if you get an attack of severe diarrhea, nausea and vomiting, or if you sweat a lot. The loss of too much body fluid can make it dangerous for you to take this medicine. Talk to your doctor about your risk of cancer. You may be more at risk for certain types of cancers if you take this medicine. Both men and women must use effective birth control with this medicine. Do not become pregnant while taking this medicine or until at least 1 normal menstrual cycle has occurred after stopping it. Women should inform their doctor if they wish to become pregnant or think they might be pregnant. Men should not father a child while taking this medicine and for 3 months after stopping it. There is a potential for serious side effects to an unborn child. Talk to your health care professional or pharmacist for more information. Do not breast-feed an infant while taking this medicine. What side effects may I notice from receiving this medicine? Side effects that you should report to your doctor or health care professional as soon as possible: -allergic reactions like skin rash, itching or hives,  swelling of the face, lips, or tongue -back pain -breathing problems or shortness of breath -confusion -diarrhea -dry, nonproductive cough -low blood counts - this medicine may decrease the number of white blood cells, red blood cells and platelets. You may be at increased risk of infections and bleeding -mouth sores -redness, blistering, peeling or loosening of the skin, including inside the mouth -seizures -severe headaches -signs of infection - fever or chills, cough, sore throat, pain or difficulty passing urine -signs and symptoms of bleeding such as bloody or black, tarry stools; red or dark-[place]champs urine; spitting up blood or [place] material that looks like coffee grounds; red spots on the skin; unusual bruising or bleeding from the eye, gums, or nose -signs and symptoms of kidney injury like trouble passing urine or change in the amount of urine -signs and symptoms of liver injury like dark yellow or Balash urine; general ill feeling or flu-like symptoms; light-colored stools; loss of appetite; nausea; right upper belly pain; unusually weak or tired; yellowing of the eyes or skin -stiff neck -vomiting Side effects that usually do not require medical attention (report to your doctor or health care professional if they continue or are bothersome): -dizziness -hair loss -headache -stomach pain -upset stomach This list may not describe all   possible side effects. Call your doctor for medical advice about side effects. You may report side effects to FDA at 1-800-FDA-1088. Where should I keep my medicine? If you are using this medicine at home, you will be instructed on how to store this medicine. Throw away any unused medicine after the expiration date on the label. NOTE: This sheet is a summary. It may not cover all possible information. If you have questions about this medicine, talk to your doctor, pharmacist, or health care provider.  2019 Elsevier/Gold Standard (2016-10-20  13:31:42)  Fluorouracil, 5-FU injection What is this medicine? FLUOROURACIL, 5-FU (flure oh YOOR a sil) is a chemotherapy drug. It slows the growth of cancer cells. This medicine is used to treat many types of cancer like breast cancer, colon or rectal cancer, pancreatic cancer, and stomach cancer. This medicine may be used for other purposes; ask your health care provider or pharmacist if you have questions. COMMON BRAND NAME(S): Adrucil What should I tell my health care provider before I take this medicine? They need to know if you have any of these conditions: -blood disorders -dihydropyrimidine dehydrogenase (DPD) deficiency -infection (especially a virus infection such as chickenpox, cold sores, or herpes) -kidney disease -liver disease -malnourished, poor nutrition -recent or ongoing radiation therapy -an unusual or allergic reaction to fluorouracil, other chemotherapy, other medicines, foods, dyes, or preservatives -pregnant or trying to get pregnant -breast-feeding How should I use this medicine? This drug is given as an infusion or injection into a vein. It is administered in a hospital or clinic by a specially trained health care professional. Talk to your pediatrician regarding the use of this medicine in children. Special care may be needed. Overdosage: If you think you have taken too much of this medicine contact a poison control center or emergency room at once. NOTE: This medicine is only for you. Do not share this medicine with others. What if I miss a dose? It is important not to miss your dose. Call your doctor or health care professional if you are unable to keep an appointment. What may interact with this medicine? -allopurinol -cimetidine -dapsone -digoxin -hydroxyurea -leucovorin -levamisole -medicines for seizures like ethotoin, fosphenytoin, phenytoin -medicines to increase blood counts like filgrastim, pegfilgrastim, sargramostim -medicines that treat or  prevent blood clots like warfarin, enoxaparin, and dalteparin -methotrexate -metronidazole -pyrimethamine -some other chemotherapy drugs like busulfan, cisplatin, estramustine, vinblastine -trimethoprim -trimetrexate -vaccines Talk to your doctor or health care professional before taking any of these medicines: -acetaminophen -aspirin -ibuprofen -ketoprofen -naproxen This list may not describe all possible interactions. Give your health care provider a list of all the medicines, herbs, non-prescription drugs, or dietary supplements you use. Also tell them if you smoke, drink alcohol, or use illegal drugs. Some items may interact with your medicine. What should I watch for while using this medicine? Visit your doctor for checks on your progress. This drug may make you feel generally unwell. This is not uncommon, as chemotherapy can affect healthy cells as well as cancer cells. Report any side effects. Continue your course of treatment even though you feel ill unless your doctor tells you to stop. In some cases, you may be given additional medicines to help with side effects. Follow all directions for their use. Call your doctor or health care professional for advice if you get a fever, chills or sore throat, or other symptoms of a cold or flu. Do not treat yourself. This drug decreases your body's ability to fight infections. Try to avoid   being around people who are sick. This medicine may increase your risk to bruise or bleed. Call your doctor or health care professional if you notice any unusual bleeding. Be careful brushing and flossing your teeth or using a toothpick because you may get an infection or bleed more easily. If you have any dental work done, tell your dentist you are receiving this medicine. Avoid taking products that contain aspirin, acetaminophen, ibuprofen, naproxen, or ketoprofen unless instructed by your doctor. These medicines may hide a fever. Do not become pregnant while  taking this medicine. Women should inform their doctor if they wish to become pregnant or think they might be pregnant. There is a potential for serious side effects to an unborn child. Talk to your health care professional or pharmacist for more information. Do not breast-feed an infant while taking this medicine. Men should inform their doctor if they wish to father a child. This medicine may lower sperm counts. Do not treat diarrhea with over the counter products. Contact your doctor if you have diarrhea that lasts more than 2 days or if it is severe and watery. This medicine can make you more sensitive to the sun. Keep out of the sun. If you cannot avoid being in the sun, wear protective clothing and use sunscreen. Do not use sun lamps or tanning beds/booths. What side effects may I notice from receiving this medicine? Side effects that you should report to your doctor or health care professional as soon as possible: -allergic reactions like skin rash, itching or hives, swelling of the face, lips, or tongue -low blood counts - this medicine may decrease the number of white blood cells, red blood cells and platelets. You may be at increased risk for infections and bleeding. -signs of infection - fever or chills, cough, sore throat, pain or difficulty passing urine -signs of decreased platelets or bleeding - bruising, pinpoint red spots on the skin, black, tarry stools, blood in the urine -signs of decreased red blood cells - unusually weak or tired, fainting spells, lightheadedness -breathing problems -changes in vision -chest pain -mouth sores -nausea and vomiting -pain, swelling, redness at site where injected -pain, tingling, numbness in the hands or feet -redness, swelling, or sores on hands or feet -stomach pain -unusual bleeding Side effects that usually do not require medical attention (report to your doctor or health care professional if they continue or are bothersome): -changes in  finger or toe nails -diarrhea -dry or itchy skin -hair loss -headache -loss of appetite -sensitivity of eyes to the light -stomach upset -unusually teary eyes This list may not describe all possible side effects. Call your doctor for medical advice about side effects. You may report side effects to FDA at 1-800-FDA-1088. Where should I keep my medicine? This drug is given in a hospital or clinic and will not be stored at home. NOTE: This sheet is a summary. It may not cover all possible information. If you have questions about this medicine, talk to your doctor, pharmacist, or health care provider.  2019 Elsevier/Gold Standard (2007-07-04 13:53:16)   

## 2018-05-16 NOTE — Addendum Note (Signed)
Addended by: Chauncey Cruel on: 05/16/2018 09:32 AM   Modules accepted: Orders

## 2018-05-17 ENCOUNTER — Telehealth: Payer: Self-pay | Admitting: *Deleted

## 2018-05-17 ENCOUNTER — Other Ambulatory Visit: Payer: Self-pay | Admitting: *Deleted

## 2018-05-17 NOTE — Telephone Encounter (Signed)
This RN spoke with pt per call stating she is having incontinence with urination.  She states " I feel the urge to go and I start leaking with urine running down my leg "  Kristina Rivera states she has baseline mild incontinence with sneezing or coughing .  She is asking if this is from the medication.  This RN discussed above including pt states she does not have pain or burning with urination.  She states steroids ( as with use of her inhaler ) tend to increase her incontinence.  This RN reviewed medications and verified with pt above is likely related to some of the medications as well as the large volume of fluids she received in a short time.  Above symptom should self alleviate.  Recommendation given for pt to do " planned urination " every 1.5 hours and then if beneficial lengthen the time between.  This RN did request pt to call if she develops any pain, burning with urination or a fever.  Note Kristina Rivera states overall she felt she did very well with the therapy yesterday including issues of being exposed to negative ions. She greatly appreciates being put in a bedroom and feels she can tolerate planned therapy with current strategy.

## 2018-05-18 ENCOUNTER — Telehealth: Payer: Self-pay | Admitting: *Deleted

## 2018-05-18 DIAGNOSIS — C50911 Malignant neoplasm of unspecified site of right female breast: Secondary | ICD-10-CM | POA: Diagnosis not present

## 2018-05-18 NOTE — Telephone Encounter (Signed)
-----   Message from Teodoro Spray, RN sent at 05/16/2018  1:09 PM EST ----- Regarding: Dr. Reinaldo Meeker time chemo followup Dr. Jana Hakim, first time chemo patient. Tolerated treatment well.

## 2018-05-18 NOTE — Telephone Encounter (Signed)
Roy; [phone] number(s).  Message left requesting a return call for chemotherapy follow up.  Awaiting return call from patient.   See 05-17-2018 telephone encounter.  Sharyne Peach spoke with collaborative nurse.

## 2018-05-21 ENCOUNTER — Other Ambulatory Visit: Payer: Self-pay | Admitting: *Deleted

## 2018-05-21 DIAGNOSIS — Z171 Estrogen receptor negative status [ER-]: Principal | ICD-10-CM

## 2018-05-21 DIAGNOSIS — C50511 Malignant neoplasm of lower-outer quadrant of right female breast: Secondary | ICD-10-CM

## 2018-05-22 ENCOUNTER — Encounter: Payer: Self-pay | Admitting: Adult Health

## 2018-05-22 ENCOUNTER — Inpatient Hospital Stay (HOSPITAL_BASED_OUTPATIENT_CLINIC_OR_DEPARTMENT_OTHER): Payer: Medicare HMO | Admitting: Adult Health

## 2018-05-22 ENCOUNTER — Inpatient Hospital Stay: Payer: Medicare HMO

## 2018-05-22 VITALS — BP 144/96 | HR 74 | Temp 98.8°F | Resp 19 | Ht 62.0 in | Wt 299.5 lb

## 2018-05-22 DIAGNOSIS — Z171 Estrogen receptor negative status [ER-]: Secondary | ICD-10-CM | POA: Diagnosis not present

## 2018-05-22 DIAGNOSIS — C50511 Malignant neoplasm of lower-outer quadrant of right female breast: Secondary | ICD-10-CM

## 2018-05-22 DIAGNOSIS — Z5111 Encounter for antineoplastic chemotherapy: Secondary | ICD-10-CM | POA: Diagnosis not present

## 2018-05-22 LAB — CMP (CANCER CENTER ONLY)
ALT: 13 U/L (ref 0–44)
AST: 17 U/L (ref 15–41)
Albumin: 3.4 g/dL — ABNORMAL LOW (ref 3.5–5.0)
Alkaline Phosphatase: 81 U/L (ref 38–126)
Anion gap: 8 (ref 5–15)
BUN: 9 mg/dL (ref 6–20)
CO2: 32 mmol/L (ref 22–32)
Calcium: 9.4 mg/dL (ref 8.9–10.3)
Chloride: 100 mmol/L (ref 98–111)
Creatinine: 0.8 mg/dL (ref 0.44–1.00)
GFR, Est AFR Am: >60 mL/min (ref >60)
GFR, Estimated: >60 mL/min (ref >60)
Glucose, Bld: 89 mg/dL (ref 70–99)
Potassium: 4.6 mmol/L (ref 3.5–5.1)
Sodium: 140 mmol/L (ref 135–145)
Total Bilirubin: 0.4 mg/dL (ref 0.3–1.2)
Total Protein: 7.9 g/dL (ref 6.5–8.1)

## 2018-05-22 LAB — CBC WITH DIFFERENTIAL (CANCER CENTER ONLY)
Abs Immature Granulocytes: 0.06 10*3/uL (ref 0.00–0.07)
Basophils Absolute: 0.1 10*3/uL (ref 0.0–0.1)
Basophils Relative: 1 %
EOS PCT: 10 %
Eosinophils Absolute: 1 10*3/uL — ABNORMAL HIGH (ref 0.0–0.5)
HCT: 37.5 % (ref 36.0–46.0)
Hemoglobin: 11.2 g/dL — ABNORMAL LOW (ref 12.0–15.0)
Immature Granulocytes: 1 %
Lymphocytes Relative: 29 %
Lymphs Abs: 2.7 10*3/uL (ref 0.7–4.0)
MCH: 21.9 pg — ABNORMAL LOW (ref 26.0–34.0)
MCHC: 29.9 g/dL — AB (ref 30.0–36.0)
MCV: 73.2 fL — ABNORMAL LOW (ref 80.0–100.0)
Monocytes Absolute: 0.5 10*3/uL (ref 0.1–1.0)
Monocytes Relative: 5 %
Neutro Abs: 5 10*3/uL (ref 1.7–7.7)
Neutrophils Relative %: 54 %
Platelet Count: 300 10*3/uL (ref 150–400)
RBC: 5.12 MIL/uL — ABNORMAL HIGH (ref 3.87–5.11)
RDW: 15.3 % (ref 11.5–15.5)
WBC: 9.2 10*3/uL (ref 4.0–10.5)
nRBC: 0 % (ref 0.0–0.2)

## 2018-05-22 NOTE — Progress Notes (Signed)
[place]  [phone] [phone]    ID: AMIRE GOSSEN DOB: [birth year]  MR#: [id number]  LTJ#:[id number]  Patient Care Team: Jonathon Jordan, MD as PCP - General (Family Medicine) Magrinat, [name], MD as Consulting Physician (Oncology) Stark Klein, MD as Consulting Physician (General Surgery) Magrinat, [name], MD as Consulting Physician (Oncology) Gery Pray, MD as Consulting Physician (Radiation Oncology) Scot Dock, NP OTHER MD:   CHIEF COMPLAINT: Triple negative breast cancer  CURRENT TREATMENT: Adjuvant chemotherapy (CMF)   HISTORY OF CURRENT ILLNESS: The original intake note:  Kristina Rivera had palpated a lump in her left breast. She then underwent mammography on 02/23/2018 at the [facility] showing a possible abnormality in both the right and left breasts. She underwent bilateral diagnostic mammography with tomography and bilateral breast ultrasonography on 03/01/2018 showing: 3 cm mass in the 8 o'clock position of the right breast highly suspicious for breast malignancy; Two borderline abnormal right axillary lymph nodes with cortical thicknesses of 4 mm; indeterminate mass or lesion in the 2 o'clock retroareolar region of the left breast measuring 6 mm and no left axillary adenopathy.   Accordingly on 03/09/2018 she proceeded to biopsy of the breast areas in question. The pathology from this procedure ([id number]) showed: high grade carcinoma with necrosis in the right breast at 8 o'clock; benign right axilla lymph node; benign breast parenchyma in the left breast at 2 o'clock retroareolar. Prognostic indicators significant for: estrogen receptor, 0% negative and progesterone receptor, 0% negative. Proliferation marker Ki67 at 80%. HER2 equivocal (2+) by immunohistochemistry, but negative by fluorescent in situ hybridization with an initial analysis result with a signals ratio 1.22 and number per cell 4.4. A repeat analysis  was completed showing a signals ratio 1.13 and number per cell 4.43.   She underwent genetic screening on 03/20/2018 with results pending.  The patient's subsequent history is as detailed below.   INTERVAL HISTORY: Kristina Rivera returns today for follow-up and treatment of her triple negative breast cancer. She is accompanied by her husband. She is seen in the treatment area.   She will begin adjuvant chemotherapy will consist of cyclophosphamide, methotrexate and fluorouracil (CMF) given every 21 days x 8. She started this on 05/16/2018.     REVIEW OF SYSTEMS: Milli is tearful today.  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  She has periods of shortness of breath due to this and had some difficulty sleeping.  She denies any bowel changes.  She did have some urinary frequency with the dexamethasone that has since resolved.  She denies any current chest pain or palpitations.  She does have intermittent headaches that she gets with the electro sensitivity.  She is fatigued.  She denies nausea or vomiting.  A detailed ROS Was otherwise non contributory.    PAST MEDICAL HISTORY: Past Medical History:  Diagnosis Date  . Anxiety   . Arthritis    "back; knees; shoulders" (04/19/2013)  . Asthma   . Chronic lower back pain   . Daily headache    "last couple months" (04/19/2013)  . Depression   . Family history of breast cancer   . Family history of lung cancer     PAST SURGICAL HISTORY: Past Surgical History:  Procedure Laterality Date  . BREAST LUMPECTOMY WITH AXILLARY LYMPH NODE BIOPSY Right 03/28/2018   Procedure: RIGHT BREAST LUMPECTOMY WITH RIGHT SENTINEL LYMPH NODE BIOPSY;  Surgeon: Stark Klein, MD;  Location: [facility] OR;  Service: General;  Laterality: Right;  . CARPAL TUNNEL RELEASE Left 1999?  Marland Kitchen PORTACATH PLACEMENT Left 05/14/2018   Procedure: INSERTION PORT-A-CATH;  Surgeon: Stark Klein, MD;  Location: [place];  Service: General;   Laterality: Left;  . TUBAL LIGATION  1993    FAMILY HISTORY: Family History  Problem Relation Age of Onset  . Breast cancer Maternal Aunt 64  . Pneumonia Mother   . Lung cancer Mother 63  . Stroke Father   . Head & neck cancer Sister 30       neck  . Prostate cancer Neg Hx    She underwent commercial genetic testing and was told she is 25% of European Jewish ancestry. Patient father was 2 years old when he died from stroke. Patient mother died from pneumonia at age 59.  The patient notes a family hx of breast cancer in a maternal aunt diagnosed with breast cancer at age 59.  The patient has 9 siblings, 4 brothers and 5 sisters.  1 sister was diagnosed with neck cancer at age 92, and patient's mom with lung cancer at age 51.   GYNECOLOGIC HISTORY:  No LMP recorded. Patient is postmenopausal. Menarche: 58 years old Age at first live birth: 58 years old [place] P 5 LMP around age 19 Contraceptive: never HRT: no  Hysterectomy? no So? no   SOCIAL HISTORY: She is on disability due to a back injury. Prior to that, she was a Quarry manager at [employer]. Her husband, Kristina Rivera, is retired. He was a Animal nutritionist. They have 5 children. Kristina Rivera, age 75, is a Community education officer in [place], [place]. Kristina Rivera, age 31, is a Education officer, museum here in [place]. Kristina Rivera, age 65, is a school Engineer, water in [place], [place]. Kristina Decamp., age 29, is a Customer service manager here in [place]. Kristina Rivera, age 10, is a nanny in [place], [place]. She and her husband have two grandchildren, and her husband has 7 grandchildren and 7 great-grandchildren from 2 children from a prior marriage. They attend the [employer].  They tell me their religious affiliation does not involve any medical restrictions  ADVANCED DIRECTIVES: The patient and her husband are each others healthcare powers of attorney   HEALTH MAINTENANCE: Social History   Tobacco  Use  . Smoking status: Never Smoker  . Smokeless tobacco: Never Used  Substance Use Topics  . Alcohol use: No  . Drug use: No     Colonoscopy: 2010, Dr. Vella Kohler?  PAP: 07/2016  Bone density: more than 3 years ago   Allergies  Allergen Reactions  . Aspirin Anaphylaxis and Hives  . Nsaids Anaphylaxis    Current Outpatient Medications  Medication Sig Dispense Refill  . acetaminophen (TYLENOL) 650 MG CR tablet Take 1,300 mg by mouth every 8 (eight) hours as needed for pain.    Marland Kitchen albuterol (VENTOLIN HFA) 108 (90 BASE) MCG/ACT inhaler Inhale 2 puffs into the lungs as needed. (Patient taking differently: Inhale 2 puffs into the lungs every 6 (six) hours as needed for wheezing or shortness of breath. ) 1 Inhaler 6  . lidocaine-prilocaine (EMLA) cream Apply to affected area once 30 g 3  . Liniments (PAIN RELIEF EX) Apply 1 application topically 4 (four) times daily as needed (pain.). [employer]ins Deep Penetrating Pain Relief Ointment    . Multiple Vitamin (MULTIVITAMIN) LIQD Take 5 mLs by mouth daily. Nutraburst multi vitamin liquid    .  OVER THE COUNTER MEDICATION Take 1 capsule by mouth daily with lunch. Siberian Chaga Extract    . oxyCODONE (OXY IR/ROXICODONE) 5 MG immediate release tablet Take 1 tablet (5 mg total) by mouth every 6 (six) hours as needed for severe pain. 20 tablet 0  . prochlorperazine (COMPAZINE) 10 MG tablet Take 1 tablet (10 mg total) by mouth 3 (three) times daily before meals. 30 tablet 1   No current facility-administered medications for this visit.     OBJECTIVE:   Vitals:   05/22/18 1505  BP: (!) 144/96  Pulse: 74  Resp: 19  Temp: 98.8 F (37.1 C)  SpO2: 100%     Body mass index is 54.78 kg/m.   Wt Readings from Last 3 Encounters:  05/22/18 299 lb 8 oz (135.9 kg)  05/14/18 (!) 302 lb (137 kg)  05/10/18 (!) 305 lb 14.4 oz (138.8 kg)  ECOG FS:1 - Symptomatic but completely ambulatory GENERAL: Patient is a well appearing female in no acute  distress HEENT:  Sclerae anicteric.  Oropharynx clear and moist. No ulcerations or evidence of oropharyngeal candidiasis. Neck is supple.  NODES:  No cervical, supraclavicular, or axillary lymphadenopathy palpated.  BREAST EXAM:  Deferred. LUNGS:  Clear to auscultation bilaterally.  No wheezes or rhonchi. HEART:  Regular rate and rhythm. No murmur appreciated. ABDOMEN:  Soft, nontender.  Positive, normoactive bowel sounds. No organomegaly palpated. MSK:  No focal spinal tenderness to palpation. Full range of motion bilaterally in the upper extremities. EXTREMITIES:  No peripheral edema.   SKIN:  Clear with no obvious rashes or skin changes. No nail dyscrasia. NEURO:  Nonfocal. Well oriented.  Appropriate affect.    LAB RESULTS:  CMP     Component Value Date/Time   NA 143 05/16/2018 0835   K 4.2 05/16/2018 0835   CL 105 05/16/2018 0835   CO2 30 05/16/2018 0835   GLUCOSE 96 05/16/2018 0835   BUN 9 05/16/2018 0835   CREATININE 0.84 05/16/2018 0835   CREATININE 0.72 11/23/2012 1616   CALCIUM 9.2 05/16/2018 0835   PROT 8.0 05/16/2018 0835   ALBUMIN 3.4 (L) 05/16/2018 0835   AST 18 05/16/2018 0835   ALT 15 05/16/2018 0835   ALKPHOS 78 05/16/2018 0835   BILITOT 0.4 05/16/2018 0835   GFRNONAA >60 05/16/2018 0835   GFRAA >60 05/16/2018 0835    No results found for: TOTALPROTELP, ALBUMINELP, A1GS, A2GS, BETS, BETA2SER, GAMS, MSPIKE, SPEI  No results found for: KPAFRELGTCHN, LAMBDASER, KAPLAMBRATIO  Lab Results  Component Value Date   WBC 9.2 05/22/2018   NEUTROABS 5.0 05/22/2018   HGB 11.2 (L) 05/22/2018   HCT 37.5 05/22/2018   MCV 73.2 (L) 05/22/2018   PLT 300 05/22/2018    _0 @  No results found for: LABCA2  No components found for: [id number]  No results for input(s): INR in the last 168 hours.  No results found for: LABCA2  No results found for: YQI347  No results found for: QQV956  No results found for: LOV564  No results found for:  CA2729  No components found for: HGQUANT  No results found for: CEA1 / No results found for: CEA1   No results found for: AFPTUMOR  No results found for: CHROMOGRNA  No results found for: PSA1  Appointment on 05/22/2018  Component Date Value Ref Range Status  . WBC Count 05/22/2018 9.2  4.0 - 10.5 K/uL Final  . RBC 05/22/2018 5.12* 3.87 - 5.11 MIL/uL Final  . Hemoglobin 05/22/2018 11.2* 12.0 - 15.0  g/dL Final  . HCT 05/22/2018 37.5  36.0 - 46.0 % Final  . MCV 05/22/2018 73.2* 80.0 - 100.0 fL Final  . MCH 05/22/2018 21.9* 26.0 - 34.0 pg Final  . MCHC 05/22/2018 29.9* 30.0 - 36.0 g/dL Final  . RDW 05/22/2018 15.3  11.5 - 15.5 % Final  . Platelet Count 05/22/2018 300  150 - 400 K/uL Final  . nRBC 05/22/2018 0.0  0.0 - 0.2 % Final  . Neutrophils Relative % 05/22/2018 54  % Final  . Neutro Abs 05/22/2018 5.0  1.7 - 7.7 K/uL Final  . Lymphocytes Relative 05/22/2018 29  % Final  . Lymphs Abs 05/22/2018 2.7  0.7 - 4.0 K/uL Final  . Monocytes Relative 05/22/2018 5  % Final  . Monocytes Absolute 05/22/2018 0.5  0.1 - 1.0 K/uL Final  . Eosinophils Relative 05/22/2018 10  % Final  . Eosinophils Absolute 05/22/2018 1.0* 0.0 - 0.5 K/uL Final  . Basophils Relative 05/22/2018 1  % Final  . Basophils Absolute 05/22/2018 0.1  0.0 - 0.1 K/uL Final  . Immature Granulocytes 05/22/2018 1  % Final  . Abs Immature Granulocytes 05/22/2018 0.06  0.00 - 0.07 K/uL Final   Performed at [facility], [place] [address] [place], [place] [address]    (this displays the last labs from the last 3 days)  No results found for: TOTALPROTELP, ALBUMINELP, A1GS, A2GS, BETS, BETA2SER, GAMS, MSPIKE, SPEI (this displays SPEP labs)  No results found for: KPAFRELGTCHN, LAMBDASER, KAPLAMBRATIO (kappa/lambda light chains)  No results found for: HGBA, HGBA2QUANT, HGBFQUANT, HGBSQUAN (Hemoglobinopathy evaluation)   No results found for: LDH  No results found for: IRON, TIBC, IRONPCTSAT (Iron  and TIBC)  No results found for: FERRITIN  Urinalysis No results found for: COLORURINE, APPEARANCEUR, LABSPEC, PHURINE, GLUCOSEU, HGBUR, BILIRUBINUR, KETONESUR, PROTEINUR, UROBILINOGEN, NITRITE, LEUKOCYTESUR   STUDIES: Dg Chest Port 1 View  Result Date: 05/14/2018 CLINICAL DATA:  Status post Port-A-Cath placement. EXAM: PORTABLE CHEST 1 VIEW COMPARISON:  04/19/2013 FINDINGS: Left subclavian Port-A-Cath has been placed. Catheter tip in the SVC region. Negative for a pneumothorax. Heart size is enlarged but may be accentuated by the portable AP technique. No large areas of airspace disease or consolidation. Probable atelectasis in the left hilar region. Bone structures are unremarkable. IMPRESSION: Placement of left subclavian Port-A-Cath. Catheter tip in the SVC region. Negative for a pneumothorax. Question cardiomegaly. Electronically Signed   By: Markus Daft M.D.   On: 05/14/2018 09:14   Dg Fluoro Guide Cv Line-no Report  Result Date: 05/14/2018 Fluoroscopy was utilized by the requesting physician.  No radiographic interpretation.     ELIGIBLE FOR AVAILABLE RESEARCH PROTOCOL: no   ASSESSMENT: 58 y.o. [place] woman status post right breast lower outer quadrant biopsy 03/09/2018 for a clinical T2N0, stage IIB invasive ductal carcinoma, triple negative, with an MIB-1 of 80%  (1) Genetic testing performed through [facility] Common Hereditary Cancers Panel + EGFR on 03/21/2018 showed no pathogenic mutations in APC, ATM, AXIN2, BARD1, BMPR1A, BRCA1, BRCA2, BRIP1, BUB1B, CDH1, CDK4, CDKN2A, CHEK2, CTNNA1, DICER1, ENG, EPCAM, GALNT12, GREM1, HOXB13, KIT, MEN1, MLH1, MLH3, MSH2, MSH3, MSH6, MUTYH, NBN, NF1, NTHL1, PALB2, PDGFRA, PMS2, POLD1, POLE, PTEN, RAD50, RAD51C, RAD51D, RNF43, RPS20, SDHA, SDHB, SDHC, SDHD, SMAD4, SMARCA4, STK11, TP53, TSC1, TSC2, VHL.   (a) A variant of uncertain significance (VUS) in a gene called ATM was also noted. [id number]>G (p.[id number]).  (2) status post right  lumpectomy and sentinel lymph node sampling 03/28/2018 for a pT2 pN0, stage IIB  invasive ductal carcinoma, with medullary features, negative margins.  (3) adjuvant chemotherapy will consist of cyclophosphamide, methotrexate and fluorouracil given every 21 days x 8   (3) adjuvant radiation to follow (may be done concurrently with cycles 4 through 6)   PLAN: Joda is doing moderately well.  She is tearful due to feeling tired, and her husband being overwhelmed, and her in turn being overwhelmed due to that. She tolerated her chemotherapy moderately well.  Her labs are stable and I reviewed them with her in detail.    I reassured Caralee that her fatigue is normal for chemotherapy, and that she will likely start to get her energy back in a few more days.    We spent a great deal of her appointment reviewing her electrosensitivity.  I reviewed the types of electricity she is able to be around.  We reviewed her tests and treatments she has received thus far and how they were tolerated.  She notes she can only have an open MRI, and the echocardiogram was very challenging for her.    After I confirmed her appointments, I shut down the computer in the exam room and this helped her to feel much better.  Florrie will return in 2 weeks for labs, f/u with Dr. Jana Hakim, and her next treatment.  She knows to call for any other issue that may develop before the next visit.  A total of (20) minutes of face-to-face time was spent with this patient with greater than 50% of that time in counseling and care-coordination.   Wilber Bihari, NP  05/22/18 3:17 PM Medical Oncology and Hematology [facility] [address] [place], [place] [address] Tel. [phone]    Fax. [phone]

## 2018-06-04 ENCOUNTER — Other Ambulatory Visit: Payer: Self-pay | Admitting: *Deleted

## 2018-06-04 DIAGNOSIS — C50511 Malignant neoplasm of lower-outer quadrant of right female breast: Secondary | ICD-10-CM

## 2018-06-04 DIAGNOSIS — Z171 Estrogen receptor negative status [ER-]: Principal | ICD-10-CM

## 2018-06-05 ENCOUNTER — Inpatient Hospital Stay: Payer: Medicare HMO

## 2018-06-05 ENCOUNTER — Inpatient Hospital Stay: Payer: Medicare HMO | Admitting: Oncology

## 2018-06-06 ENCOUNTER — Telehealth: Payer: Self-pay | Admitting: *Deleted

## 2018-06-06 NOTE — Telephone Encounter (Signed)
Called pt to assess needs and inquire on no show for CMF cycle 2. Pt informed "it's within my best interest to not come in right now". Informed pt of all precautions we are taking to ensure the safety of our patients. Pt still declined to come in for treatment until next month. Informed pt that I would schedule her to see Dr. Jana Hakim or Mendel Ryder with CMF in April. Received verbal understanding.  Dr. Jana Hakim notified.

## 2018-06-07 ENCOUNTER — Encounter: Payer: Self-pay | Admitting: Oncology

## 2018-06-07 ENCOUNTER — Other Ambulatory Visit: Payer: Self-pay | Admitting: Oncology

## 2018-06-07 NOTE — Progress Notes (Signed)
The patient called to cancel her second cycle of chemotherapy because of coronavirus concerns.  I have written her a letter encouraging her to come for treatment oh 4/14 2020.

## 2018-06-14 ENCOUNTER — Encounter: Payer: Self-pay | Admitting: Oncology

## 2018-06-14 NOTE — Progress Notes (Signed)
Called patient to go over remaining balance and expenses for the grant. She verbalized understanding.

## 2018-06-25 ENCOUNTER — Telehealth: Payer: Self-pay | Admitting: *Deleted

## 2018-06-25 NOTE — Progress Notes (Signed)
[place]  Telephone:([phone]) [phone] Fax:(336) [phone]    ID: Kristina Rivera DOB: 04/07/1960  MR#: [id number]  JOA#:[id number]  Patient Care Team: Jonathon Jordan, MD as PCP - General (Family Medicine) Veeda Virgo, [name], MD as Consulting Physician (Oncology) Stark Klein, MD as Consulting Physician (General Surgery) Tanyiah Laurich, [name], MD as Consulting Physician (Oncology) Gery Pray, MD as Consulting Physician (Radiation Oncology) Chauncey Cruel, MD OTHER MD:   CHIEF COMPLAINT: Triple negative breast cancer  CURRENT TREATMENT: Adjuvant chemotherapy (CMF)   HISTORY OF CURRENT ILLNESS: The original intake note:  Kristina Rivera had palpated a lump in her left breast. She then underwent mammography on 02/23/2018 at the [facility] showing a possible abnormality in both the right and left breasts. She underwent bilateral diagnostic mammography with tomography and bilateral breast ultrasonography on 03/01/2018 showing: 3 cm mass in the 8 o'clock position of the right breast highly suspicious for breast malignancy; Two borderline abnormal right axillary lymph nodes with cortical thicknesses of 4 mm; indeterminate mass or lesion in the 2 o'clock retroareolar region of the left breast measuring 6 mm and no left axillary adenopathy.   Accordingly on 03/09/2018 she proceeded to biopsy of the breast areas in question. The pathology from this procedure ([id number]) showed: high grade carcinoma with necrosis in the right breast at 8 o'clock; benign right axilla lymph node; benign breast parenchyma in the left breast at 2 o'clock retroareolar. Prognostic indicators significant for: estrogen receptor, 0% negative and progesterone receptor, 0% negative. Proliferation marker Ki67 at 80%. HER2 equivocal (2+) by immunohistochemistry, but negative by fluorescent in situ hybridization with an initial analysis result with a signals ratio 1.22 and number per cell 4.4. A repeat analysis  was completed showing a signals ratio 1.13 and number per cell 4.43.   She underwent genetic screening on 03/20/2018 with results pending.  The patient's subsequent history is as detailed below.   INTERVAL HISTORY: Kristina Rivera returns today for follow-up and treatment of her triple negative breast cancer.   She started on adjuvant chemotherapy 05/16/2018 consisting of cyclophosphamide, methotrexate and fluorouracil given every 21 days x 8. Kristina Rivera then skipped her next scheduled dose at her request due to COVID-19 concerns. Today then is day 1 cycle 2.   She was weak, with a decreased appetite, fatigue, and a general "blah" feeling after her first cycle. Her sense of taste was decreased. She has had no nausea.  Overall she tolerated it well with no unusual side effects  Since her last visit here, she has not undergone any additional studies.    REVIEW OF SYSTEMS: Kristina Rivera notes some tingling in her right breast around the nipple area in the last few days. She has been trying to stick to a general routine despite COVID-19. Her husband does the grocery shopping for the household and he is taking appropriate precautions. She has had some allergies when she is around flowers, that makes her feel short of breath, but those symptoms resolve when she is back inside and away from pollen. The patient denies unusual headaches, visual changes, nausea, vomiting, or dizziness. There has been no unusual cough, phlegm production, or pleurisy. This been no change in bowel or bladder habits. The patient denies unexplained fatigue or unexplained weight loss, bleeding, rash, or fever. A detailed review of systems was otherwise noncontributory.    PAST MEDICAL HISTORY: Past Medical History:  Diagnosis Date  . Anxiety   . Arthritis    "back; knees; shoulders" (04/19/2013)  . Asthma   .  Chronic lower back pain   . Daily headache    "last couple months" (04/19/2013)  . Depression   . Family history of breast cancer   .  Family history of lung cancer     PAST SURGICAL HISTORY: Past Surgical History:  Procedure Laterality Date  . BREAST LUMPECTOMY WITH AXILLARY LYMPH NODE BIOPSY Right 03/28/2018   Procedure: RIGHT BREAST LUMPECTOMY WITH RIGHT SENTINEL LYMPH NODE BIOPSY;  Surgeon: Stark Klein, MD;  Location: [facility];  Service: General;  Laterality: Right;  . CARPAL TUNNEL RELEASE Left 1999?  Marland Kitchen PORTACATH PLACEMENT Left 05/14/2018   Procedure: INSERTION PORT-A-CATH;  Surgeon: Stark Klein, MD;  Location: [facility];  Service: General;  Laterality: Left;  . TUBAL LIGATION  1993    FAMILY HISTORY: Family History  Problem Relation Age of Onset  . Breast cancer Maternal Aunt 21  . Pneumonia Mother   . Lung cancer Mother 57  . Stroke Father   . Head & neck cancer Sister 30       neck  . Prostate cancer Neg Hx    She underwent commercial genetic testing and was told she is 25% of European Jewish ancestry. Patient father was 55 years old when he died from stroke. Patient mother died from pneumonia at age 55.  The patient notes a family hx of breast cancer in a maternal aunt diagnosed with breast cancer at age 35.  The patient has 9 siblings, 4 brothers and 5 sisters.  1 sister was diagnosed with neck cancer at age 78, and patient's mom with lung cancer at age 39.   GYNECOLOGIC HISTORY:  No LMP recorded. Patient is postmenopausal. Menarche: 58 years old Age at first live birth: 58 years old [place] P 5 LMP around age 1 Contraceptive: never HRT: no  Hysterectomy? no So? no   SOCIAL HISTORY: She is on disability due to a back injury. Prior to that, she was a Quarry manager at [employer]. Her husband, Mariea Clonts, is retired. He was a Animal nutritionist. They have 5 children. Pascal Lux, age 31, is a Community education officer in [place], [place]. Orvis Brill, age 54, is a Education officer, museum here in [place]. Wendy Poet, age 18, is a school Engineer, water in [place], [place]. Silverio Decamp., age 41, is a Customer service manager  here in [place]. [place], age 42, is a nanny in [place], [place]. She and her husband have two grandchildren, and her husband has 7 grandchildren and 7 great-grandchildren from 2 children from a prior marriage. They attend the [employer].  They tell me their religious affiliation does not involve any medical restrictions  ADVANCED DIRECTIVES: The patient and her husband are each others healthcare powers of attorney   HEALTH MAINTENANCE: Social History   Tobacco Use  . Smoking status: Never Smoker  . Smokeless tobacco: Never Used  Substance Use Topics  . Alcohol use: No  . Drug use: No     Colonoscopy: 2010, Dr. Vella Kohler?  PAP: 07/2016  Bone density: more than 3 years ago   Allergies  Allergen Reactions  . Aspirin Anaphylaxis and Hives  . Nsaids Anaphylaxis    Current Outpatient Medications  Medication Sig Dispense Refill  . acetaminophen (TYLENOL) 650 MG CR tablet Take 1,300 mg by mouth every 8 (eight) hours as needed for pain.    Marland Kitchen albuterol (VENTOLIN HFA) 108 (90 BASE) MCG/ACT inhaler Inhale 2 puffs into the lungs as needed. (Patient taking differently: Inhale 2 puffs  into the lungs every 6 (six) hours as needed for wheezing or shortness of breath. ) 1 Inhaler 6  . lidocaine-prilocaine (EMLA) cream Apply to affected area once 30 g 3  . Liniments (PAIN RELIEF EX) Apply 1 application topically 4 (four) times daily as needed (pain.). [employer] Deep Penetrating Pain Relief Ointment    . Multiple Vitamin (MULTIVITAMIN) LIQD Take 5 mLs by mouth daily. Nut[place]urst multi vitamin liquid    . OVER THE COUNTER MEDICATION Take 1 capsule by mouth daily with lunch. Siberian Chaga Extract    . oxyCODONE (OXY IR/ROXICODONE) 5 MG immediate release tablet Take 1 tablet (5 mg total) by mouth every 6 (six) hours as needed for severe pain. 20 tablet 0  . prochlorperazine (COMPAZINE) 10 MG tablet Take 1 tablet (10 mg total) by mouth 3 (three)  times daily before meals. 30 tablet 1   No current facility-administered medications for this visit.     OBJECTIVE: Morbidly obese African-American woman in no acute distress  Vitals:   06/26/18 1421  BP: (!) 163/99  Pulse: 69  Resp: 18  Temp: 98.6 F (37 C)  SpO2: 98%     Body mass index is 55.73 kg/m.   Wt Readings from Last 3 Encounters:  06/26/18 (!) 304 lb 11.2 oz (138.2 kg)  05/22/18 299 lb 8 oz (135.9 kg)  05/14/18 (!) 302 lb (137 kg)  ECOG FS:1 - Symptomatic but completely ambulatory  Sclerae unicteric, EOMs intact No cervical or supraclavicular adenopathy Lungs no rales or rhonchi Heart regular rate and rhythm Abd soft, nontender, positive bowel sounds MSK no focal spinal tenderness, no right upper extremity lymphedema Neuro: nonfocal, well oriented, appropriate affect Breasts: She is having some tenderness and tingling around the right nipple but by exam it is unremarkable.  She is having sensitivity and soreness associated with the right axillary incision but again by exam this area is benign.  The left breast is unremarkable.  Both axillae are benign.   LAB RESULTS:  CMP     Component Value Date/Time   NA 142 06/26/2018 1401   K 4.4 06/26/2018 1401   CL 103 06/26/2018 1401   CO2 29 06/26/2018 1401   GLUCOSE 94 06/26/2018 1401   BUN 9 06/26/2018 1401   CREATININE 0.83 06/26/2018 1401   CREATININE 0.72 11/23/2012 1616   CALCIUM 9.4 06/26/2018 1401   PROT 7.9 06/26/2018 1401   ALBUMIN 3.6 06/26/2018 1401   AST 25 06/26/2018 1401   ALT 19 06/26/2018 1401   ALKPHOS 77 06/26/2018 1401   BILITOT 0.4 06/26/2018 1401   GFRNONAA >60 06/26/2018 1401   GFRAA >60 06/26/2018 1401    No results found for: TOTALPROTELP, ALBUMINELP, A1GS, A2GS, BETS, BETA2SER, GAMS, MSPIKE, SPEI  No results found for: KPAFRELGTCHN, LAMBDASER, KAPLAMBRATIO  Lab Results  Component Value Date   WBC 8.4 06/26/2018   NEUTROABS 3.5 06/26/2018   HGB 11.8 (L) 06/26/2018   HCT 39.7  06/26/2018   MCV 74.3 (L) 06/26/2018   PLT 269 06/26/2018    '@LASTCHEMISTRY'$ @  No results found for: LABCA2  No components found for: GQBVQX450  No results for input(s): INR in the last 168 hours.  No results found for: LABCA2  No results found for: TUU828  No results found for: MKL491  No results found for: PHX505  No results found for: CA2729  No components found for: HGQUANT  No results found for: CEA1 / No results found for: CEA1   No results found for: [facility]  No results found for: CHROMOGRNA  No results found for: PSA1  Appointment on 06/26/2018  Component Date Value Ref Range Status  . Sodium 06/26/2018 142  135 - 145 mmol/L Final  . Potassium 06/26/2018 4.4  3.5 - 5.1 mmol/L Final  . Chloride 06/26/2018 103  98 - 111 mmol/L Final  . CO2 06/26/2018 29  22 - 32 mmol/L Final  . Glucose, Bld 06/26/2018 94  70 - 99 mg/dL Final  . BUN 06/26/2018 9  6 - 20 mg/dL Final  . Creatinine 06/26/2018 0.83  0.44 - 1.00 mg/dL Final  . Calcium 06/26/2018 9.4  8.9 - 10.3 mg/dL Final  . Total Protein 06/26/2018 7.9  6.5 - 8.1 g/dL Final  . Albumin 06/26/2018 3.6  3.5 - 5.0 g/dL Final  . AST 06/26/2018 25  15 - 41 U/L Final  . ALT 06/26/2018 19  0 - 44 U/L Final  . Alkaline Phosphatase 06/26/2018 77  38 - 126 U/L Final  . Total Bilirubin 06/26/2018 0.4  0.3 - 1.2 mg/dL Final  . GFR, Est Non Af Am 06/26/2018 >60  >60 mL/min Final  . GFR, Est AFR Am 06/26/2018 >60  >60 mL/min Final  . Anion gap 06/26/2018 10  5 - 15 Final   Performed at [facility], [place] [address], [place], [place] [address]  . WBC Count 06/26/2018 8.4  4.0 - 10.5 K/uL Final  . RBC 06/26/2018 5.34* 3.87 - 5.11 MIL/uL Final  . Hemoglobin 06/26/2018 11.8* 12.0 - 15.0 g/dL Final  . HCT 06/26/2018 39.7  36.0 - 46.0 % Final  . MCV 06/26/2018 74.3* 80.0 - 100.0 fL Final  . MCH 06/26/2018 22.1* 26.0 - 34.0 pg Final  . MCHC 06/26/2018 29.7* 30.0 - 36.0 g/dL Final  . RDW 06/26/2018 16.6*  11.5 - 15.5 % Final  . Platelet Count 06/26/2018 269  150 - 400 K/uL Final  . nRBC 06/26/2018 0.0  0.0 - 0.2 % Final  . Neutrophils Relative % 06/26/2018 42  % Final  . Neutro Abs 06/26/2018 3.5  1.7 - 7.7 K/uL Final  . Lymphocytes Relative 06/26/2018 36  % Final  . Lymphs Abs 06/26/2018 3.0  0.7 - 4.0 K/uL Final  . Monocytes Relative 06/26/2018 10  % Final  . Monocytes Absolute 06/26/2018 0.8  0.1 - 1.0 K/uL Final  . Eosinophils Relative 06/26/2018 11  % Final  . Eosinophils Absolute 06/26/2018 1.0* 0.0 - 0.5 K/uL Final  . Basophils Relative 06/26/2018 1  % Final  . Basophils Absolute 06/26/2018 0.1  0.0 - 0.1 K/uL Final  . Immature Granulocytes 06/26/2018 0  % Final  . Abs Immature Granulocytes 06/26/2018 0.03  0.00 - 0.07 K/uL Final   Performed at [facility], [place] [address] [place], [place] [address]    (this displays the last labs from the last 3 days)  No results found for: TOTALPROTELP, ALBUMINELP, A1GS, A2GS, BETS, BETA2SER, GAMS, MSPIKE, SPEI (this displays SPEP labs)  No results found for: KPAFRELGTCHN, LAMBDASER, KAPLAMBRATIO (kappa/lambda light chains)  No results found for: HGBA, HGBA2QUANT, HGBFQUANT, HGBSQUAN (Hemoglobinopathy evaluation)   No results found for: LDH  No results found for: IRON, TIBC, IRONPCTSAT (Iron and TIBC)  No results found for: FERRITIN  Urinalysis No results found for: COLORURINE, APPEARANCEUR, LABSPEC, PHURINE, GLUCOSEU, HGBUR, BILIRUBINUR, KETONESUR, PROTEINUR, UROBILINOGEN, NITRITE, LEUKOCYTESUR   STUDIES: No results found.   ELIGIBLE FOR AVAILABLE RESEARCH PROTOCOL: no   ASSESSMENT: 58 y.o. [place] woman status post  right breast lower outer quadrant biopsy 03/09/2018 for a clinical T2N0, stage IIB invasive ductal carcinoma, triple negative, with an MIB-1 of 80%  (1) Genetic testing performed through [facility] Common Hereditary Cancers Panel + EGFR on 03/21/2018 showed no pathogenic mutations in  APC, ATM, AXIN2, BARD1, BMPR1A, BRCA1, BRCA2, BRIP1, BUB1B, CDH1, CDK4, CDKN2A, CHEK2, CTNNA1, DICER1, ENG, EPCAM, GALNT12, GREM1, HOXB13, KIT, MEN1, MLH1, MLH3, MSH2, MSH3, MSH6, MUTYH, NBN, NF1, NTHL1, PALB2, PDGFRA, PMS2, POLD1, POLE, PTEN, RAD50, RAD51C, RAD51D, RNF43, RPS20, SDHA, SDHB, SDHC, SDHD, SMAD4, SMARCA4, STK11, TP53, TSC1, TSC2, VHL.   (a) A variant of uncertain significance (VUS) in a gene called ATM was also noted. [id number] ([id number]).  (2) status post right lumpectomy and sentinel lymph node sampling 03/28/2018 for a pT2 pN0, stage IIB invasive ductal carcinoma, with medullary features, negative margins.  (3) adjuvant chemotherapy will consist of cyclophosphamide, methotrexate and fluorouracil given every 21 days x 8   (3) adjuvant radiation to follow (may be done concurrently with cycles 4 through 6)   PLAN: Kristina Rivera tolerated her first cycle of CMF generally well and is ready to proceed to cycle #2 today, with a delay secondary to concerns that she had about the current pandemic.  We are doing our best to accommodate her radiation sensitivity.  She is being treated in her room by herself and we are turning off all the electricity in that room except as absolutely necessary to treat her.  I encouraged her to remain as active as possible between treatments.  She is following appropriate pandemic precautions  She will return to see me 07/17/2018, with labs and treatment same day.  Hopefully we can get her through the entire 8 cycles without any further delays.    At the next visit we will discuss concurrent chemoradiation  Leslee Suire, [name], MD  06/26/18 2:46 PM Medical Oncology and Hematology [facility] [address] [place], [place] [address] Tel. [phone]    Fax. [phone]  I, Jacqualyn Posey am acting as a Education administrator for Chauncey Cruel, MD.   I, Lurline Del MD, have reviewed the above documentation for accuracy and completeness, and I  agree with the above.

## 2018-06-25 NOTE — Telephone Encounter (Signed)
Pt called with concerns of her husband being unable to be with her during tx. Discussed with pt policy in place to keep her the pt, her husband and staff safe and exceptions were not being made at this time, but that I would discuss her situation with AD.  [employer] and discussed pt request and was informed that exception was not being made, but that she will have a nurse tech sit with her during her treatment and provide support and take care of her needs during tx. I called pt and discussed policy and that a nurse tech would be able to be her to provide support. Pt wishes to think things over and will call with her decision. Denies further needs or questions at this time. Care team notified.

## 2018-06-26 ENCOUNTER — Inpatient Hospital Stay (HOSPITAL_BASED_OUTPATIENT_CLINIC_OR_DEPARTMENT_OTHER): Payer: Medicare HMO | Admitting: Oncology

## 2018-06-26 ENCOUNTER — Inpatient Hospital Stay: Payer: Medicare HMO

## 2018-06-26 ENCOUNTER — Other Ambulatory Visit: Payer: Self-pay

## 2018-06-26 ENCOUNTER — Telehealth: Payer: Self-pay | Admitting: *Deleted

## 2018-06-26 ENCOUNTER — Inpatient Hospital Stay: Payer: Medicare HMO | Attending: Oncology

## 2018-06-26 VITALS — BP 142/80

## 2018-06-26 VITALS — BP 163/99 | HR 69 | Temp 98.6°F | Resp 18 | Ht 62.0 in | Wt 304.7 lb

## 2018-06-26 DIAGNOSIS — Z171 Estrogen receptor negative status [ER-]: Secondary | ICD-10-CM | POA: Diagnosis not present

## 2018-06-26 DIAGNOSIS — Z5111 Encounter for antineoplastic chemotherapy: Secondary | ICD-10-CM | POA: Insufficient documentation

## 2018-06-26 DIAGNOSIS — Z6841 Body Mass Index (BMI) 40.0 and over, adult: Secondary | ICD-10-CM

## 2018-06-26 DIAGNOSIS — C50511 Malignant neoplasm of lower-outer quadrant of right female breast: Secondary | ICD-10-CM

## 2018-06-26 DIAGNOSIS — T66XXXD Radiation sickness, unspecified, subsequent encounter: Secondary | ICD-10-CM

## 2018-06-26 DIAGNOSIS — T66XXXA Radiation sickness, unspecified, initial encounter: Secondary | ICD-10-CM | POA: Insufficient documentation

## 2018-06-26 LAB — CBC WITH DIFFERENTIAL (CANCER CENTER ONLY)
Abs Immature Granulocytes: 0.03 10*3/uL (ref 0.00–0.07)
Basophils Absolute: 0.1 10*3/uL (ref 0.0–0.1)
Basophils Relative: 1 %
Eosinophils Absolute: 1 10*3/uL — ABNORMAL HIGH (ref 0.0–0.5)
Eosinophils Relative: 11 %
HCT: 39.7 % (ref 36.0–46.0)
Hemoglobin: 11.8 g/dL — ABNORMAL LOW (ref 12.0–15.0)
Immature Granulocytes: 0 %
Lymphocytes Relative: 36 %
Lymphs Abs: 3 10*3/uL (ref 0.7–4.0)
MCH: 22.1 pg — ABNORMAL LOW (ref 26.0–34.0)
MCHC: 29.7 g/dL — ABNORMAL LOW (ref 30.0–36.0)
MCV: 74.3 fL — ABNORMAL LOW (ref 80.0–100.0)
Monocytes Absolute: 0.8 10*3/uL (ref 0.1–1.0)
Monocytes Relative: 10 %
Neutro Abs: 3.5 10*3/uL (ref 1.7–7.7)
Neutrophils Relative %: 42 %
Platelet Count: 269 10*3/uL (ref 150–400)
RBC: 5.34 MIL/uL — ABNORMAL HIGH (ref 3.87–5.11)
RDW: 16.6 % — ABNORMAL HIGH (ref 11.5–15.5)
WBC Count: 8.4 10*3/uL (ref 4.0–10.5)
nRBC: 0 % (ref 0.0–0.2)

## 2018-06-26 LAB — CMP (CANCER CENTER ONLY)
ALT: 19 U/L (ref 0–44)
AST: 25 U/L (ref 15–41)
Albumin: 3.6 g/dL (ref 3.5–5.0)
Alkaline Phosphatase: 77 U/L (ref 38–126)
Anion gap: 10 (ref 5–15)
BUN: 9 mg/dL (ref 6–20)
CO2: 29 mmol/L (ref 22–32)
Calcium: 9.4 mg/dL (ref 8.9–10.3)
Chloride: 103 mmol/L (ref 98–111)
Creatinine: 0.83 mg/dL (ref 0.44–1.00)
GFR, Est AFR Am: >60 mL/min (ref >60)
GFR, Estimated: >60 mL/min (ref >60)
Glucose, Bld: 94 mg/dL (ref 70–99)
Potassium: 4.4 mmol/L (ref 3.5–5.1)
Sodium: 142 mmol/L (ref 135–145)
Total Bilirubin: 0.4 mg/dL (ref 0.3–1.2)
Total Protein: 7.9 g/dL (ref 6.5–8.1)

## 2018-06-26 MED ORDER — PALONOSETRON HCL INJECTION 0.25 MG/5ML
INTRAVENOUS | Status: AC
  Filled 2018-06-26: qty 5

## 2018-06-26 MED ORDER — LORAZEPAM 2 MG/ML IJ SOLN
INTRAMUSCULAR | Status: AC
  Filled 2018-06-26: qty 1

## 2018-06-26 MED ORDER — METHOTREXATE SODIUM (PF) CHEMO INJECTION 250 MG/10ML
40.6000 mg/m2 | Freq: Once | INTRAMUSCULAR | Status: AC
  Administered 2018-06-26: 100 mg via INTRAVENOUS
  Filled 2018-06-26: qty 4

## 2018-06-26 MED ORDER — PALONOSETRON HCL INJECTION 0.25 MG/5ML
0.2500 mg | Freq: Once | INTRAVENOUS | Status: AC
  Administered 2018-06-26: 0.25 mg via INTRAVENOUS

## 2018-06-26 MED ORDER — DEXAMETHASONE SODIUM PHOSPHATE 10 MG/ML IJ SOLN
10.0000 mg | Freq: Once | INTRAMUSCULAR | Status: AC
  Administered 2018-06-26: 10 mg via INTRAVENOUS

## 2018-06-26 MED ORDER — DEXAMETHASONE SODIUM PHOSPHATE 10 MG/ML IJ SOLN
INTRAMUSCULAR | Status: AC
  Filled 2018-06-26: qty 1

## 2018-06-26 MED ORDER — LORAZEPAM 2 MG/ML IJ SOLN
0.5000 mg | Freq: Once | INTRAMUSCULAR | Status: AC
  Administered 2018-06-26: 0.5 mg via INTRAVENOUS

## 2018-06-26 MED ORDER — SODIUM CHLORIDE 0.9 % IV SOLN
Freq: Once | INTRAVENOUS | Status: AC
  Administered 2018-06-26: 15:00:00 via INTRAVENOUS
  Filled 2018-06-26: qty 250

## 2018-06-26 MED ORDER — HEPARIN SOD (PORK) LOCK FLUSH 100 UNIT/ML IV SOLN
500.0000 [IU] | Freq: Once | INTRAVENOUS | Status: AC | PRN
  Administered 2018-06-26: 500 [IU]
  Filled 2018-06-26: qty 5

## 2018-06-26 MED ORDER — SODIUM CHLORIDE 0.9% FLUSH
10.0000 mL | INTRAVENOUS | Status: DC | PRN
  Administered 2018-06-26: 10 mL
  Filled 2018-06-26: qty 10

## 2018-06-26 MED ORDER — FLUOROURACIL CHEMO INJECTION 2.5 GM/50ML
600.0000 mg/m2 | Freq: Once | INTRAVENOUS | Status: AC
  Administered 2018-06-26: 1500 mg via INTRAVENOUS
  Filled 2018-06-26: qty 30

## 2018-06-26 MED ORDER — SODIUM CHLORIDE 0.9 % IV SOLN
600.0000 mg/m2 | Freq: Once | INTRAVENOUS | Status: AC
  Administered 2018-06-26: 1480 mg via INTRAVENOUS
  Filled 2018-06-26: qty 74

## 2018-06-26 NOTE — Patient Instructions (Signed)
[facility] Discharge Instructions for Patients Receiving Chemotherapy  Today you received the following chemotherapy agents: cyclophosphamide (Cytoxan), methotrexate, 5-fluorouracil.   To help prevent nausea and vomiting after your treatment, we encourage you to take your nausea medication as directed by your physician.   If you develop nausea and vomiting that is not controlled by your nausea medication, call the clinic.   BELOW ARE SYMPTOMS THAT SHOULD BE REPORTED IMMEDIATELY:  *FEVER GREATER THAN 100.5 F  *CHILLS WITH OR WITHOUT FEVER  NAUSEA AND VOMITING THAT IS NOT CONTROLLED WITH YOUR NAUSEA MEDICATION  *UNUSUAL SHORTNESS OF BREATH  *UNUSUAL BRUISING OR BLEEDING  TENDERNESS IN MOUTH AND THROAT WITH OR WITHOUT PRESENCE OF ULCERS  *URINARY PROBLEMS  *BOWEL PROBLEMS  UNUSUAL RASH Items with * indicate a potential emergency and should be followed up as soon as possible.  Feel free to call the clinic should you have any questions or concerns. The clinic phone number is [phone].  Please show the [facility] at check-in to the Emergency Department and triage nurse.  Cyclophosphamide injection What is this medicine? CYCLOPHOSPHAMIDE (sye kloe FOSS fa mide) is a chemotherapy drug. It slows the growth of cancer cells. This medicine is used to treat many types of cancer like lymphoma, myeloma, leukemia, breast cancer, and ovarian cancer, to name a few. This medicine may be used for other purposes; ask your health care provider or pharmacist if you have questions. COMMON BRAND NAME(S): Cytoxan, Neosar What should I tell my health care provider before I take this medicine? They need to know if you have any of these conditions: -blood disorders -history of other chemotherapy -infection -kidney disease -liver disease -recent or ongoing radiation therapy -tumors in the bone marrow -an unusual or allergic reaction to cyclophosphamide, other  chemotherapy, other medicines, foods, dyes, or preservatives -pregnant or trying to get pregnant -breast-feeding How should I use this medicine? This drug is usually given as an injection into a vein or muscle or by infusion into a vein. It is administered in a hospital or clinic by a specially trained health care professional. Talk to your pediatrician regarding the use of this medicine in children. Special care may be needed. Overdosage: If you think you have taken too much of this medicine contact a poison control center or emergency room at once. NOTE: This medicine is only for you. Do not share this medicine with others. What if I miss a dose? It is important not to miss your dose. Call your doctor or health care professional if you are unable to keep an appointment. What may interact with this medicine? This medicine may interact with the following medications: -amiodarone -amphotericin B -azathioprine -certain antiviral medicines for HIV or AIDS such as protease inhibitors (e.g., indinavir, ritonavir) and zidovudine -certain blood pressure medications such as benazepril, captopril, enalapril, fosinopril, lisinopril, moexipril, monopril, perindopril, quinapril, ramipril, trandolapril -certain cancer medications such as anthracyclines (e.g., daunorubicin, doxorubicin), busulfan, cytarabine, paclitaxel, pentostatin, tamoxifen, trastuzumab -certain diuretics such as chlorothiazide, chlorthalidone, hydrochlorothiazide, indapamide, metolazone -certain medicines that treat or prevent blood clots like warfarin -certain muscle relaxants such as succinylcholine -cyclosporine -etanercept -indomethacin -medicines to increase blood counts like filgrastim, pegfilgrastim, sargramostim -medicines used as general anesthesia -metronidazole -natalizumab This list may not describe all possible interactions. Give your health care provider a list of all the medicines, herbs, non-prescription drugs, or  dietary supplements you use. Also tell them if you smoke, drink alcohol, or use illegal drugs. Some items may interact with your  medicine. What should I watch for while using this medicine? Visit your doctor for checks on your progress. This drug may make you feel generally unwell. This is not uncommon, as chemotherapy can affect healthy cells as well as cancer cells. Report any side effects. Continue your course of treatment even though you feel ill unless your doctor tells you to stop. Drink water or other fluids as directed. Urinate often, even at night. In some cases, you may be given additional medicines to help with side effects. Follow all directions for their use. Call your doctor or health care professional for advice if you get a fever, chills or sore throat, or other symptoms of a cold or flu. Do not treat yourself. This drug decreases your body's ability to fight infections. Try to avoid being around people who are sick. This medicine may increase your risk to bruise or bleed. Call your doctor or health care professional if you notice any unusual bleeding. Be careful brushing and flossing your teeth or using a toothpick because you may get an infection or bleed more easily. If you have any dental work done, tell your dentist you are receiving this medicine. You may get drowsy or dizzy. Do not drive, use machinery, or do anything that needs mental alertness until you know how this medicine affects you. Do not become pregnant while taking this medicine or for 1 year after stopping it. Women should inform their doctor if they wish to become pregnant or think they might be pregnant. Men should not father a child while taking this medicine and for 4 months after stopping it. There is a potential for serious side effects to an unborn child. Talk to your health care professional or pharmacist for more information. Do not breast-feed an infant while taking this medicine. This medicine may interfere  with the ability to have a child. This medicine has caused ovarian failure in some women. This medicine has caused reduced sperm counts in some men. You should talk with your doctor or health care professional if you are concerned about your fertility. If you are going to have surgery, tell your doctor or health care professional that you have taken this medicine. What side effects may I notice from receiving this medicine? Side effects that you should report to your doctor or health care professional as soon as possible: -allergic reactions like skin rash, itching or hives, swelling of the face, lips, or tongue -low blood counts - this medicine may decrease the number of white blood cells, red blood cells and platelets. You may be at increased risk for infections and bleeding. -signs of infection - fever or chills, cough, sore throat, pain or difficulty passing urine -signs of decreased platelets or bleeding - bruising, pinpoint red spots on the skin, black, tarry stools, blood in the urine -signs of decreased red blood cells - unusually weak or tired, fainting spells, lightheadedness -breathing problems -dark urine -dizziness -palpitations -swelling of the ankles, feet, hands -trouble passing urine or change in the amount of urine -weight gain -yellowing of the eyes or skin Side effects that usually do not require medical attention (report to your doctor or health care professional if they continue or are bothersome): -changes in nail or skin color -hair loss -missed menstrual periods -mouth sores -nausea, vomiting This list may not describe all possible side effects. Call your doctor for medical advice about side effects. You may report side effects to FDA at 1-800-FDA-1088. Where should I keep my medicine? This drug  is given in a hospital or clinic and will not be stored at home. NOTE: This sheet is a summary. It may not cover all possible information. If you have questions about this  medicine, talk to your doctor, pharmacist, or health care provider.  2019 Else[employer] Standard (2012-01-13 16:22:58)  Methotrexate injection What is this medicine? METHOTREXATE (METH oh TREX ate) is a chemotherapy drug used to treat cancer including breast cancer, leukemia, and lymphoma. This medicine can also be used to treat psoriasis and certain kinds of arthritis. This medicine may be used for other purposes; ask your health care provider or pharmacist if you have questions. What should I tell my health care provider before I take this medicine? They need to know if you have any of these conditions: -fluid in the stomach area or lungs -if you often drink alcohol -infection or immune system problems -kidney disease -liver disease -low blood counts, like low white cell, platelet, or red cell counts -lung disease -radiation therapy -stomach ulcers -ulcerative colitis -an unusual or allergic reaction to methotrexate, other medicines, foods, dyes, or preservatives -pregnant or trying to get pregnant -breast-feeding How should I use this medicine? This medicine is for infusion into a vein or for injection into muscle or into the spinal fluid (whichever applies). It is usually given by a health care professional in a hospital or clinic setting. In rare cases, you might get this medicine at home. You will be taught how to give this medicine. Use exactly as directed. Take your medicine at regular intervals. Do not take your medicine more often than directed. If this medicine is used for arthritis or psoriasis, it should be taken weekly, NOT daily. It is important that you put your used needles and syringes in a special sharps container. Do not put them in a trash can. If you do not have a sharps container, call your pharmacist or healthcare provider to get one. Talk to your pediatrician regarding the use of this medicine in children. While this drug may be prescribed for children as young as  2 years for selected conditions, precautions do apply. Overdosage: If you think you have taken too much of this medicine contact a poison control center or emergency room at once. NOTE: This medicine is only for you. Do not share this medicine with others. What if I miss a dose? It is important not to miss your dose. Call your doctor or health care professional if you are unable to keep an appointment. If you give yourself the medicine and you miss a dose, talk with your doctor or health care professional. Do not take double or extra doses. What may interact with this medicine? This medicine may interact with the following medications: -acitretin -aspirin or aspirin-like medicines including salicylates -azathioprine -certain antibiotics like chloramphenicol, penicillin, tetracycline -certain medicines for stomach problems like esomeprazole, omeprazole, pantoprazole -cyclosporine -gold -hydroxychloroquine -live virus vaccines -mercaptopurine -NSAIDs, medicines for pain and inflammation, like ibuprofen or naproxen -other cytotoxic agents -penicillamine -phenylbutazone -phenytoin -probenacid -retinoids such as isotretinoin and tretinoin -steroid medicines like prednisone or cortisone -sulfonamides like sulfasalazine and trimethoprim/sulfamethoxazole -theophylline This list may not describe all possible interactions. Give your health care provider a list of all the medicines, herbs, non-prescription drugs, or dietary supplements you use. Also tell them if you smoke, drink alcohol, or use illegal drugs. Some items may interact with your medicine. What should I watch for while using this medicine? Avoid alcoholic drinks. In some cases, you may be given additional medicines to  help with side effects. Follow all directions for their use. This medicine can make you more sensitive to the sun. Keep out of the sun. If you cannot avoid being in the sun, wear protective clothing and use sunscreen.  Do not use sun lamps or tanning beds/booths. You may get drowsy or dizzy. Do not drive, use machinery, or do anything that needs mental alertness until you know how this medicine affects you. Do not stand or sit up quickly, especially if you are an older patient. This reduces the risk of dizzy or fainting spells. You may need blood work done while you are taking this medicine. Call your doctor or health care professional for advice if you get a fever, chills or sore throat, or other symptoms of a cold or flu. Do not treat yourself. This drug decreases your body's ability to fight infections. Try to avoid being around people who are sick. This medicine may increase your risk to bruise or bleed. Call your doctor or health care professional if you notice any unusual bleeding. Check with your doctor or health care professional if you get an attack of severe diarrhea, nausea and vomiting, or if you sweat a lot. The loss of too much body fluid can make it dangerous for you to take this medicine. Talk to your doctor about your risk of cancer. You may be more at risk for certain types of cancers if you take this medicine. Both men and women must use effective birth control with this medicine. Do not become pregnant while taking this medicine or until at least 1 normal menstrual cycle has occurred after stopping it. Women should inform their doctor if they wish to become pregnant or think they might be pregnant. Men should not father a child while taking this medicine and for 3 months after stopping it. There is a potential for serious side effects to an unborn child. Talk to your health care professional or pharmacist for more information. Do not breast-feed an infant while taking this medicine. What side effects may I notice from receiving this medicine? Side effects that you should report to your doctor or health care professional as soon as possible: -allergic reactions like skin rash, itching or hives,  swelling of the face, lips, or tongue -back pain -breathing problems or shortness of breath -confusion -diarrhea -dry, nonproductive cough -low blood counts - this medicine may decrease the number of white blood cells, red blood cells and platelets. You may be at increased risk of infections and bleeding -mouth sores -redness, blistering, peeling or loosening of the skin, including inside the mouth -seizures -severe headaches -signs of infection - fever or chills, cough, sore throat, pain or difficulty passing urine -signs and symptoms of bleeding such as bloody or black, tarry stools; red or dark-[place] urine; spitting up blood or [place] material that looks like coffee grounds; red spots on the skin; unusual bruising or bleeding from the eye, gums, or nose -signs and symptoms of kidney injury like trouble passing urine or change in the amount of urine -signs and symptoms of liver injury like dark yellow or Cupples urine; general ill feeling or flu-like symptoms; light-colored stools; loss of appetite; nausea; right upper belly pain; unusually weak or tired; yellowing of the eyes or skin -stiff neck -vomiting Side effects that usually do not require medical attention (report to your doctor or health care professional if they continue or are bothersome): -dizziness -hair loss -headache -stomach pain -upset stomach This list may not describe all  possible side effects. Call your doctor for medical advice about side effects. You may report side effects to FDA at 1-800-FDA-1088. Where should I keep my medicine? If you are using this medicine at home, you will be instructed on how to store this medicine. Throw away any unused medicine after the expiration date on the label. NOTE: This sheet is a summary. It may not cover all possible information. If you have questions about this medicine, talk to your doctor, pharmacist, or health care provider.  2019 [employer] Standard (2016-10-20  13:31:42)  Fluorouracil, 5-FU injection What is this medicine? FLUOROURACIL, 5-FU (flure oh YOOR a sil) is a chemotherapy drug. It slows the growth of cancer cells. This medicine is used to treat many types of cancer like breast cancer, colon or rectal cancer, pancreatic cancer, and stomach cancer. This medicine may be used for other purposes; ask your health care provider or pharmacist if you have questions. COMMON BRAND NAME(S): Adrucil What should I tell my health care provider before I take this medicine? They need to know if you have any of these conditions: -blood disorders -dihydropyrimidine dehydrogenase (DPD) deficiency -infection (especially a virus infection such as chickenpox, cold sores, or herpes) -kidney disease -liver disease -malnourished, poor nutrition -recent or ongoing radiation therapy -an unusual or allergic reaction to fluorouracil, other chemotherapy, other medicines, foods, dyes, or preservatives -pregnant or trying to get pregnant -breast-feeding How should I use this medicine? This drug is given as an infusion or injection into a vein. It is administered in a hospital or clinic by a specially trained health care professional. Talk to your pediatrician regarding the use of this medicine in children. Special care may be needed. Overdosage: If you think you have taken too much of this medicine contact a poison control center or emergency room at once. NOTE: This medicine is only for you. Do not share this medicine with others. What if I miss a dose? It is important not to miss your dose. Call your doctor or health care professional if you are unable to keep an appointment. What may interact with this medicine? -allopurinol -cimetidine -dapsone -digoxin -hydroxyurea -leucovorin -levamisole -medicines for seizures like ethotoin, fosphenytoin, phenytoin -medicines to increase blood counts like filgrastim, pegfilgrastim, sargramostim -medicines that treat or  prevent blood clots like warfarin, enoxaparin, and dalteparin -methotrexate -metronidazole -pyrimethamine -some other chemotherapy drugs like busulfan, cisplatin, estramustine, vinblastine -trimethoprim -trimetrexate -vaccines Talk to your doctor or health care professional before taking any of these medicines: -acetaminophen -aspirin -ibuprofen -ketoprofen -naproxen This list may not describe all possible interactions. Give your health care provider a list of all the medicines, herbs, non-prescription drugs, or dietary supplements you use. Also tell them if you smoke, drink alcohol, or use illegal drugs. Some items may interact with your medicine. What should I watch for while using this medicine? Visit your doctor for checks on your progress. This drug may make you feel generally unwell. This is not uncommon, as chemotherapy can affect healthy cells as well as cancer cells. Report any side effects. Continue your course of treatment even though you feel ill unless your doctor tells you to stop. In some cases, you may be given additional medicines to help with side effects. Follow all directions for their use. Call your doctor or health care professional for advice if you get a fever, chills or sore throat, or other symptoms of a cold or flu. Do not treat yourself. This drug decreases your body's ability to fight infections. Try to avoid  being around people who are sick. This medicine may increase your risk to bruise or bleed. Call your doctor or health care professional if you notice any unusual bleeding. Be careful brushing and flossing your teeth or using a toothpick because you may get an infection or bleed more easily. If you have any dental work done, tell your dentist you are receiving this medicine. Avoid taking products that contain aspirin, acetaminophen, ibuprofen, naproxen, or ketoprofen unless instructed by your doctor. These medicines may hide a fever. Do not become pregnant while  taking this medicine. Women should inform their doctor if they wish to become pregnant or think they might be pregnant. There is a potential for serious side effects to an unborn child. Talk to your health care professional or pharmacist for more information. Do not breast-feed an infant while taking this medicine. Men should inform their doctor if they wish to father a child. This medicine may lower sperm counts. Do not treat diarrhea with over the counter products. Contact your doctor if you have diarrhea that lasts more than 2 days or if it is severe and watery. This medicine can make you more sensitive to the sun. Keep out of the sun. If you cannot avoid being in the sun, wear protective clothing and use sunscreen. Do not use sun lamps or tanning beds/booths. What side effects may I notice from receiving this medicine? Side effects that you should report to your doctor or health care professional as soon as possible: -allergic reactions like skin rash, itching or hives, swelling of the face, lips, or tongue -low blood counts - this medicine may decrease the number of white blood cells, red blood cells and platelets. You may be at increased risk for infections and bleeding. -signs of infection - fever or chills, cough, sore throat, pain or difficulty passing urine -signs of decreased platelets or bleeding - bruising, pinpoint red spots on the skin, black, tarry stools, blood in the urine -signs of decreased red blood cells - unusually weak or tired, fainting spells, lightheadedness -breathing problems -changes in vision -chest pain -mouth sores -nausea and vomiting -pain, swelling, redness at site where injected -pain, tingling, numbness in the hands or feet -redness, swelling, or sores on hands or feet -stomach pain -unusual bleeding Side effects that usually do not require medical attention (report to your doctor or health care professional if they continue or are bothersome): -changes in  finger or toe nails -diarrhea -dry or itchy skin -hair loss -headache -loss of appetite -sensitivity of eyes to the light -stomach upset -unusually teary eyes This list may not describe all possible side effects. Call your doctor for medical advice about side effects. You may report side effects to FDA at 1-800-FDA-1088. Where should I keep my medicine? This drug is given in a hospital or clinic and will not be stored at home. NOTE: This sheet is a summary. It may not cover all possible information. If you have questions about this medicine, talk to your doctor, pharmacist, or health care provider.  2019 Elsevier/[employer] Standard (2007-07-04 13:53:16)

## 2018-06-26 NOTE — Telephone Encounter (Signed)
Spoke to pt regarding appt for chemo today. Pt will come to her appt to see Dr. Hayden Rasmussen and chemo.

## 2018-06-27 ENCOUNTER — Telehealth: Payer: Self-pay | Admitting: Oncology

## 2018-06-27 NOTE — Telephone Encounter (Signed)
Called regarding 5/6

## 2018-07-18 ENCOUNTER — Telehealth: Payer: Self-pay

## 2018-07-18 ENCOUNTER — Other Ambulatory Visit: Payer: Medicare HMO

## 2018-07-18 ENCOUNTER — Inpatient Hospital Stay: Payer: Medicare HMO

## 2018-07-18 ENCOUNTER — Inpatient Hospital Stay: Payer: Medicare HMO | Admitting: Oncology

## 2018-07-18 ENCOUNTER — Ambulatory Visit (HOSPITAL_BASED_OUTPATIENT_CLINIC_OR_DEPARTMENT_OTHER): Payer: Medicare HMO | Admitting: Oncology

## 2018-07-18 ENCOUNTER — Other Ambulatory Visit: Payer: Self-pay | Admitting: Oncology

## 2018-07-18 DIAGNOSIS — Z171 Estrogen receptor negative status [ER-]: Secondary | ICD-10-CM | POA: Diagnosis not present

## 2018-07-18 DIAGNOSIS — C50511 Malignant neoplasm of lower-outer quadrant of right female breast: Secondary | ICD-10-CM

## 2018-07-18 NOTE — Progress Notes (Unsigned)
[facility]  [phone] [phone]) [phone]    ID: Kristina Rivera DOB: [birth year]  MR#: [id number]  TGP#:[id number]  Patient Care Team: Jonathon Jordan, MD as PCP - General (Family Medicine) Magrinat, [name], MD as Consulting Physician (Oncology) Stark Klein, MD as Consulting Physician (General Surgery) Magrinat, [name], MD as Consulting Physician (Oncology) Gery Pray, MD as Consulting Physician (Radiation Oncology) Chauncey Cruel, MD OTHER MD:   CHIEF COMPLAINT: Triple negative breast cancer  CURRENT TREATMENT: Observation  I connected with Kristina Rivera on 07/18/18 at  by telephone visit and verified that I am speaking with the correct person using two identifiers.   I discussed the limitations, risks, security and privacy concerns of performing an evaluation and management service by telemedicine and the availability of in-person appointments. I also discussed with the patient that there may be a patient responsible charge related to this service. The patient expressed understanding and agreed to proceed.   Other persons participating in the visit and their role in the encounter: none  Patient's location: home Provider's location: clinic  HISTORY OF CURRENT ILLNESS: The original intake note:  Kristina Rivera had palpated a lump in her left breast. She then underwent mammography on 02/23/2018 at the [facility] showing a possible abnormality in both the right and left breasts. She underwent bilateral diagnostic mammography with tomography and bilateral breast ultrasonography on 03/01/2018 showing: 3 cm mass in the 8 o'clock position of the right breast highly suspicious for breast malignancy; Two borderline abnormal right axillary lymph nodes with cortical thicknesses of 4 mm; indeterminate mass or lesion in the 2 o'clock retroareolar region of the left breast measuring 6 mm and no left axillary adenopathy.   Accordingly on 03/09/2018 she  proceeded to biopsy of the breast areas in question. The pathology from this procedure ([id number]) showed: high grade carcinoma with necrosis in the right breast at 8 o'clock; benign right axilla lymph node; benign breast parenchyma in the left breast at 2 o'clock retroareolar. Prognostic indicators significant for: estrogen receptor, 0% negative and progesterone receptor, 0% negative. Proliferation marker Ki67 at 80%. HER2 equivocal (2+) by immunohistochemistry, but negative by fluorescent in situ hybridization with an initial analysis result with a signals ratio 1.22 and number per cell 4.4. A repeat analysis was completed showing a signals ratio 1.13 and number per cell 4.43.   She underwent genetic screening on 03/20/2018 with results pending.  The patient's subsequent history is as detailed below.   INTERVAL HISTORY: Kristina Rivera was scheduled to return today for follow-up and treatment of her triple negative breast cancer.  However this morning she called and spoke to my nurse stating that she wanted to cancel all appointments here.  This is discussed further in the assessment section below  She started on adjuvant chemotherapy 05/16/2018 consisting of cyclophosphamide, methotrexate and fluorouracil given every 21 days x 8. Kristina Rivera then was followed her next scheduled dose due to her COVID-19 concerns.  She did receive a second dose on 06/26/2018.  She says that the treatment here caused her blood pressure to go up, made her feel weak, and that she had chills and fever and almost went to the emergency room.   REVIEW OF SYSTEMS: Kristina Rivera tells me she is feeling better.  [place] there is less radiation in the air in that area.  She cannot find any good information on radiation sensitivity although she is reading some [employer]  she says that speak of demyelination as a possible mechanism.  She has trouble using her phone or her computer  but she feels these are the only thing she absolutely needs to have because otherwise she could get no information on the concerns she is got.  She and her family are doing appropriate pandemic precautions.  A detailed review of systems today was otherwise stable  PAST MEDICAL HISTORY: Past Medical History:  Diagnosis Date  . Anxiety   . Arthritis    "back; knees; shoulders" (04/19/2013)  . Asthma   . Chronic lower back pain   . Daily headache    "last couple months" (04/19/2013)  . Depression   . Family history of breast cancer   . Family history of lung cancer     PAST SURGICAL HISTORY: Past Surgical History:  Procedure Laterality Date  . BREAST LUMPECTOMY WITH AXILLARY LYMPH NODE BIOPSY Right 03/28/2018   Procedure: RIGHT BREAST LUMPECTOMY WITH RIGHT SENTINEL LYMPH NODE BIOPSY;  Surgeon: Stark Klein, MD;  Location: [place];  Service: General;  Laterality: Right;  . CARPAL TUNNEL RELEASE Left 1999?  Marland Kitchen PORTACATH PLACEMENT Left 05/14/2018   Procedure: INSERTION PORT-A-CATH;  Surgeon: Stark Klein, MD;  Location: [place];  Service: General;  Laterality: Left;  . TUBAL LIGATION  1993    FAMILY HISTORY: Family History  Problem Relation Age of Onset  . Breast cancer Maternal Aunt 24  . Pneumonia Mother   . Lung cancer Mother 22  . Stroke Father   . Head & neck cancer Sister 30       neck  . Prostate cancer Neg Hx    She underwent commercial genetic testing and was told she is 25% of European Jewish ancestry. Patient father was 25 years old when he died from stroke. Patient mother died from pneumonia at age 72.  The patient notes a family hx of breast cancer in a maternal aunt diagnosed with breast cancer at age 90.  The patient has 9 siblings, 4 brothers and 5 sisters.  1 sister was diagnosed with neck cancer at age 35, and patient's mom with lung cancer at age 52.   GYNECOLOGIC HISTORY:  No LMP recorded. Patient is postmenopausal. Menarche: 58 years old Age at first live birth: 58  years old [place] P 5 LMP around age 69 Contraceptive: never HRT: no  Hysterectomy? no So? no   SOCIAL HISTORY: She is on disability due to a back injury. Prior to that, she was a Quarry manager at [employer]. Her husband, Mariea Clonts, is retired. He was a Animal nutritionist. They have 5 children. Pascal Lux, age 35, is a Community education officer in [place], [place]. Orvis Brill, age 65, is a Education officer, museum here in [place]. Wendy Poet, age 68, is a school Engineer, water in [place], [place]. Silverio Decamp., age 29, is a Customer service manager here in [place]. Jet, age 77, is a nanny in [place], [place]. She and her husband have two grandchildren, and her husband has 7 grandchildren and 7 great-grandchildren from 2 children from a prior marriage. They attend the [employer].  They tell me their religious affiliation does not involve any medical restrictions  ADVANCED DIRECTIVES: The patient and her husband are each others healthcare powers of attorney   HEALTH MAINTENANCE: Social History   Tobacco Use  . Smoking status: Never Smoker  . Smokeless tobacco: Never Used  Substance Use Topics  . Alcohol use: No  . Drug  use: No     Colonoscopy: 2010, Dr. Vella Kohler?  PAP: 07/2016  Bone density: more than 3 years ago   Allergies  Allergen Reactions  . Aspirin Anaphylaxis and Hives  . Nsaids Anaphylaxis    Current Outpatient Medications  Medication Sig Dispense Refill  . acetaminophen (TYLENOL) 650 MG CR tablet Take 1,300 mg by mouth every 8 (eight) hours as needed for pain.    Marland Kitchen albuterol (VENTOLIN HFA) 108 (90 BASE) MCG/ACT inhaler Inhale 2 puffs into the lungs as needed. (Patient taking differently: Inhale 2 puffs into the lungs every 6 (six) hours as needed for wheezing or shortness of breath. ) 1 Inhaler 6  . lidocaine-prilocaine (EMLA) cream Apply to affected area once 30 g 3  . Liniments (PAIN RELIEF EX) Apply 1 application topically  4 (four) times daily as needed (pain.). [employer] Deep Penetrating Pain Relief Ointment    . Multiple Vitamin (MULTIVITAMIN) LIQD Take 5 mLs by mouth daily. Nut[place]urst multi vitamin liquid    . OVER THE COUNTER MEDICATION Take 1 capsule by mouth daily with lunch. Siberian Chaga Extract    . oxyCODONE (OXY IR/ROXICODONE) 5 MG immediate release tablet Take 1 tablet (5 mg total) by mouth every 6 (six) hours as needed for severe pain. 20 tablet 0  . prochlorperazine (COMPAZINE) 10 MG tablet Take 1 tablet (10 mg total) by mouth 3 (three) times daily before meals. 30 tablet 1   No current facility-administered medications for this visit.     OBJECTIVE: Morbidly obese African-American woman   There were no vitals filed for this visit.   There is no height or weight on file to calculate BMI.   Wt Readings from Last 3 Encounters:  06/26/18 (!) 304 lb 11.2 oz (138.2 kg)  05/22/18 299 lb 8 oz (135.9 kg)  05/14/18 (!) 302 lb (137 kg)  ECOG FS:1 - Symptomatic but completely ambulatory  LAB RESULTS:  CMP     Component Value Date/Time   NA 142 06/26/2018 1401   K 4.4 06/26/2018 1401   CL 103 06/26/2018 1401   CO2 29 06/26/2018 1401   GLUCOSE 94 06/26/2018 1401   BUN 9 06/26/2018 1401   CREATININE 0.83 06/26/2018 1401   CREATININE 0.72 11/23/2012 1616   CALCIUM 9.4 06/26/2018 1401   PROT 7.9 06/26/2018 1401   ALBUMIN 3.6 06/26/2018 1401   AST 25 06/26/2018 1401   ALT 19 06/26/2018 1401   ALKPHOS 77 06/26/2018 1401   BILITOT 0.4 06/26/2018 1401   GFRNONAA >60 06/26/2018 1401   GFRAA >60 06/26/2018 1401    No results found for: TOTALPROTELP, ALBUMINELP, A1GS, A2GS, BETS, BETA2SER, GAMS, MSPIKE, SPEI  No results found for: KPAFRELGTCHN, LAMBDASER, KAPLAMBRATIO  Lab Results  Component Value Date   WBC 8.4 06/26/2018   NEUTROABS 3.5 06/26/2018   HGB 11.8 (L) 06/26/2018   HCT 39.7 06/26/2018   MCV 74.3 (L) 06/26/2018   PLT 269 06/26/2018    _0 @  No results found  for: LABCA2  No components found for: MVHQIO962  No results for input(s): INR in the last 168 hours.  No results found for: LABCA2  No results found for: XBM841  No results found for: LKG401  No results found for: UUV253  No results found for: CA2729  No components found for: HGQUANT  No results found for: CEA1 / No results found for: CEA1   No results found for: AFPTUMOR  No results found for: [place]  No results found for: PSA1  No visits with results within 3 Day(s) from this visit.  Latest known visit with results is:  Appointment on 06/26/2018  Component Date Value Ref Range Status  . Sodium 06/26/2018 142  135 - 145 mmol/L Final  . Potassium 06/26/2018 4.4  3.5 - 5.1 mmol/L Final  . Chloride 06/26/2018 103  98 - 111 mmol/L Final  . CO2 06/26/2018 29  22 - 32 mmol/L Final  . Glucose, Bld 06/26/2018 94  70 - 99 mg/dL Final  . BUN 06/26/2018 9  6 - 20 mg/dL Final  . Creatinine 06/26/2018 0.83  0.44 - 1.00 mg/dL Final  . Calcium 06/26/2018 9.4  8.9 - 10.3 mg/dL Final  . Total Protein 06/26/2018 7.9  6.5 - 8.1 g/dL Final  . Albumin 06/26/2018 3.6  3.5 - 5.0 g/dL Final  . AST 06/26/2018 25  15 - 41 U/L Final  . ALT 06/26/2018 19  0 - 44 U/L Final  . Alkaline Phosphatase 06/26/2018 77  38 - 126 U/L Final  . Total Bilirubin 06/26/2018 0.4  0.3 - 1.2 mg/dL Final  . GFR, Est Non Af Am 06/26/2018 >60  >60 mL/min Final  . GFR, Est AFR Am 06/26/2018 >60  >60 mL/min Final  . Anion gap 06/26/2018 10  5 - 15 Final   Performed at [facility], [place] [address] [place], [place] [address]  . WBC Count 06/26/2018 8.4  4.0 - 10.5 K/uL Final  . RBC 06/26/2018 5.34* 3.87 - 5.11 MIL/uL Final  . Hemoglobin 06/26/2018 11.8* 12.0 - 15.0 g/dL Final  . HCT 06/26/2018 39.7  36.0 - 46.0 % Final  . MCV 06/26/2018 74.3* 80.0 - 100.0 fL Final  . MCH 06/26/2018 22.1* 26.0 - 34.0 pg Final  . MCHC 06/26/2018 29.7* 30.0 - 36.0 g/dL Final  . RDW 06/26/2018 16.6* 11.5  - 15.5 % Final  . Platelet Count 06/26/2018 269  150 - 400 K/uL Final  . nRBC 06/26/2018 0.0  0.0 - 0.2 % Final  . Neutrophils Relative % 06/26/2018 42  % Final  . Neutro Abs 06/26/2018 3.5  1.7 - 7.7 K/uL Final  . Lymphocytes Relative 06/26/2018 36  % Final  . Lymphs Abs 06/26/2018 3.0  0.7 - 4.0 K/uL Final  . Monocytes Relative 06/26/2018 10  % Final  . Monocytes Absolute 06/26/2018 0.8  0.1 - 1.0 K/uL Final  . Eosinophils Relative 06/26/2018 11  % Final  . Eosinophils Absolute 06/26/2018 1.0* 0.0 - 0.5 K/uL Final  . Basophils Relative 06/26/2018 1  % Final  . Basophils Absolute 06/26/2018 0.1  0.0 - 0.1 K/uL Final  . Immature Granulocytes 06/26/2018 0  % Final  . Abs Immature Granulocytes 06/26/2018 0.03  0.00 - 0.07 K/uL Final   Performed at [facility], [place] [address] [place], [place] [address]    (this displays the last labs from the last 3 days)  No results found for: TOTALPROTELP, ALBUMINELP, A1GS, A2GS, BETS, BETA2SER, GAMS, MSPIKE, SPEI (this displays SPEP labs)  No results found for: KPAFRELGTCHN, LAMBDASER, KAPLAMBRATIO (kappa/lambda light chains)  No results found for: HGBA, HGBA2QUANT, HGBFQUANT, HGBSQUAN (Hemoglobinopathy evaluation)   No results found for: LDH  No results found for: IRON, TIBC, IRONPCTSAT (Iron and TIBC)  No results found for: FERRITIN  Urinalysis No results found for: COLORURINE, APPEARANCEUR, LABSPEC, PHURINE, GLUCOSEU, HGBUR, BILIRUBINUR, KETONESUR, PROTEINUR, UROBILINOGEN, NITRITE, LEUKOCYTESUR   STUDIES: No results found.   ELIGIBLE FOR AVAILABLE RESEARCH PROTOCOL: no   ASSESSMENT:  58 y.o.  woman status post right breast lower outer quadrant biopsy 03/09/2018 for a clinical T2N0, stage IIB invasive ductal carcinoma, triple negative, with an MIB-1 of 80%  (1) Genetic testing performed through [facility] Common Hereditary Cancers Panel + EGFR on 03/21/2018 showed no pathogenic mutations in APC,  ATM, AXIN2, BARD1, BMPR1A, BRCA1, BRCA2, BRIP1, BUB1B, CDH1, CDK4, CDKN2A, CHEK2, CTNNA1, DICER1, ENG, EPCAM, GALNT12, GREM1, HOXB13, KIT, MEN1, MLH1, MLH3, MSH2, MSH3, MSH6, MUTYH, NBN, NF1, NTHL1, PALB2, PDGFRA, PMS2, POLD1, POLE, PTEN, RAD50, RAD51C, RAD51D, RNF43, RPS20, SDHA, SDHB, SDHC, SDHD, SMAD4, SMARCA4, STK11, TP53, TSC1, TSC2, VHL.   (a) A variant of uncertain significance (VUS) in a gene called ATM was also noted. [id number] (p.[id number]).  (2) status post right lumpectomy and sentinel lymph node sampling 03/28/2018 for a pT2 pN0, stage IIB invasive ductal carcinoma, with medullary features, negative margins.  (3) adjuvant chemotherapy will consist of cyclophosphamide, methotrexate and fluorouracil given every 21 days x 8, started 05/16/2018, discontinued by the patient after a second treatment 06/26/2018  (3) adjuvant radiation recommended, but patient opted against   PLAN: I called Kristina Rivera today and had a long discussion with her regarding her situation.  She has a good understanding of the fact that chemotherapy is her only available systemic treatment and she is agreeable that "from a cancer point of view" the right decision is for her to continue.  The problem is her radiation sensitivity.  Every time she comes here she becomes very agitated, her blood pressure goes up, then her temperature goes up and she gets chills.  She thinks it is hurting her more than helping her to get exposed to radiation although she appreciates the fact that our nurses have bent over backwards to put her in rooms with a minimum of electronics.  At this point she has made a firm decision against chemotherapy now.  She understands that she will not be able to receive adjuvant chemotherapy more than a few months after her surgery and she is already 4 months out from her surgery.  Pretty much if she does not continue now she will not be able to receive adjuvant chemotherapy.  Of course she could receive  chemotherapy for stage IV disease.  That is what the adjuvant chemotherapy is designed to prevent.  I then discussed adjuvant radiation.  She is absolutely against that because of her radiation sensitivity issue.  She is doing some research regarding demyelinization.  [place] and she feels that her and she might end up moving to the mountains temporarily or permanently.  We elected that she wanted to cancel all current appointments here but she would like to see me sometime in August perhaps just to discuss how things are going.  I am certainly glad to accommodate that.  She understands that her very strong recommendation is that she complete her chemotherapy now to avoid more problems from her cancer later.     Magrinat, [name], MD  07/18/18 2:12 PM Medical Oncology and Hematology [facility] [address] [place], [place] [address] Tel. [phone]    Fax. [phone]  I, Jacqualyn Posey am acting as a Education administrator for Chauncey Cruel, MD.   I, Lurline Del MD, have reviewed the above documentation for accuracy and completeness, and I agree with the above.

## 2018-07-18 NOTE — Telephone Encounter (Signed)
Nurse returned patient's call regarding cancelling appointments.  Patient stated, "I have decided I do not want to continue with chemo any longer."  Nurse provided therapeutic communication and asked open-ended questions and patient voiced she just didn't feel this was the right path for her.      Will forward to MD for acknowledgment and review.

## 2018-07-20 DIAGNOSIS — C50911 Malignant neoplasm of unspecified site of right female breast: Secondary | ICD-10-CM | POA: Diagnosis not present

## 2018-07-27 DIAGNOSIS — C50911 Malignant neoplasm of unspecified site of right female breast: Secondary | ICD-10-CM | POA: Diagnosis not present

## 2018-07-27 DIAGNOSIS — E559 Vitamin D deficiency, unspecified: Secondary | ICD-10-CM | POA: Diagnosis not present

## 2018-07-27 DIAGNOSIS — E1169 Type 2 diabetes mellitus with other specified complication: Secondary | ICD-10-CM | POA: Diagnosis not present

## 2018-07-27 DIAGNOSIS — R718 Other abnormality of red blood cells: Secondary | ICD-10-CM | POA: Diagnosis not present

## 2018-07-27 DIAGNOSIS — Z79899 Other long term (current) drug therapy: Secondary | ICD-10-CM | POA: Diagnosis not present

## 2018-07-27 DIAGNOSIS — E119 Type 2 diabetes mellitus without complications: Secondary | ICD-10-CM | POA: Diagnosis not present

## 2018-07-27 DIAGNOSIS — Z0000 Encounter for general adult medical examination without abnormal findings: Secondary | ICD-10-CM | POA: Diagnosis not present

## 2018-08-01 ENCOUNTER — Other Ambulatory Visit: Payer: Self-pay | Admitting: Oncology

## 2018-08-01 ENCOUNTER — Telehealth: Payer: Self-pay | Admitting: Oncology

## 2018-08-01 NOTE — Telephone Encounter (Signed)
Cancelled all upcoming appt per sch msg. Scheduled appt for august. Called and spoke with patient. Informed of cancellations and new appt.

## 2018-08-07 ENCOUNTER — Ambulatory Visit: Payer: Self-pay | Admitting: Oncology

## 2018-08-07 ENCOUNTER — Other Ambulatory Visit: Payer: Self-pay

## 2018-08-07 ENCOUNTER — Other Ambulatory Visit: Payer: Medicare HMO

## 2018-08-07 ENCOUNTER — Ambulatory Visit: Payer: Self-pay

## 2018-08-13 ENCOUNTER — Other Ambulatory Visit: Payer: Self-pay | Admitting: Oncology

## 2018-08-17 DIAGNOSIS — C50911 Malignant neoplasm of unspecified site of right female breast: Secondary | ICD-10-CM | POA: Diagnosis not present

## 2018-08-28 ENCOUNTER — Other Ambulatory Visit: Payer: Self-pay

## 2018-08-28 ENCOUNTER — Other Ambulatory Visit: Payer: Medicare HMO

## 2018-08-28 ENCOUNTER — Ambulatory Visit: Payer: Self-pay

## 2018-08-28 ENCOUNTER — Ambulatory Visit: Payer: Self-pay | Admitting: Oncology

## 2018-08-30 ENCOUNTER — Telehealth: Payer: Self-pay | Admitting: Oncology

## 2018-08-30 NOTE — Telephone Encounter (Signed)
Faxed medical records to [facility] [phone], Release ID: [id number]

## 2018-09-04 DIAGNOSIS — T66XXXA Radiation sickness, unspecified, initial encounter: Secondary | ICD-10-CM | POA: Diagnosis not present

## 2018-09-04 DIAGNOSIS — C50911 Malignant neoplasm of unspecified site of right female breast: Secondary | ICD-10-CM | POA: Diagnosis not present

## 2018-09-04 DIAGNOSIS — Z171 Estrogen receptor negative status [ER-]: Secondary | ICD-10-CM | POA: Diagnosis not present

## 2018-09-06 ENCOUNTER — Telehealth: Payer: Self-pay | Admitting: Oncology

## 2018-09-06 NOTE — Telephone Encounter (Signed)
Faxed labs, genetic testing and pathology reports to Janett Billow with [place] attached to Release ZC#[id number]

## 2018-09-16 ENCOUNTER — Other Ambulatory Visit: Payer: Self-pay | Admitting: General Surgery

## 2018-09-21 ENCOUNTER — Other Ambulatory Visit: Payer: Self-pay | Admitting: General Surgery

## 2018-10-15 ENCOUNTER — Other Ambulatory Visit (HOSPITAL_COMMUNITY)
Admission: RE | Admit: 2018-10-15 | Discharge: 2018-10-15 | Disposition: A | Discharge status: Home / Self Care | Payer: Medicare HMO | Source: Ambulatory Visit | Attending: General Surgery | Admitting: General Surgery

## 2018-10-15 DIAGNOSIS — Z20828 Contact with and (suspected) exposure to other viral communicable diseases: Secondary | ICD-10-CM | POA: Insufficient documentation

## 2018-10-15 DIAGNOSIS — Z01812 Encounter for preprocedural laboratory examination: Secondary | ICD-10-CM | POA: Diagnosis not present

## 2018-10-15 LAB — SARS CORONAVIRUS 2 (TAT 6-24 HRS): SARS Coronavirus 2: NEGATIVE

## 2018-10-16 ENCOUNTER — Other Ambulatory Visit: Payer: Self-pay

## 2018-10-16 ENCOUNTER — Encounter (HOSPITAL_COMMUNITY): Payer: Self-pay | Admitting: *Deleted

## 2018-10-16 NOTE — Progress Notes (Signed)
Spoke with pt for pre-op call. Pt denies cardiac history. 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 She states this does cause her blood pressure to increase if she is around electromagnetic fields. She states her blood pressure has been up to 200/150 and she was worked up by a cardiologist because of that. She states it also causes her asthma to flare up with chest tightness and increased coughing. She has a blanket lined with silver that she states she uses when her blood pressure goes up and it helps bring it down. I instructed her to bring the blanket Thursday AM. She states she will and has done this in the past. Pt is not diabetic.   Pt had Covid test done yesterday and it was negative. She states she has been in quarantine since and will continue to be until surgery.   Coronavirus Screening  Have you experienced the following symptoms:  Cough NO Fever (>100.40F)  NO Runny nose NO Sore throat NO Difficulty breathing/shortness of breath  NO Have you or a family member traveled in the last 14 days and where? NO  Patient reminded that hospital visitation restrictions are in effect and the importance of the restrictions. Pt informed that 1 person is allowed to wait in waiting area during the pre-op, surgery and recovery of pt. Pt voiced understanding.

## 2018-10-17 MED ORDER — DEXTROSE 5 % IV SOLN
3.0000 g | INTRAVENOUS | Status: AC
  Administered 2018-10-18: 3 g via INTRAVENOUS
  Filled 2018-10-17: qty 3000
  Filled 2018-10-17: qty 3

## 2018-10-18 ENCOUNTER — Ambulatory Visit (HOSPITAL_COMMUNITY): Payer: Medicare HMO | Admitting: Certified Registered"

## 2018-10-18 ENCOUNTER — Encounter (HOSPITAL_COMMUNITY)
Admission: RE | Disposition: A | Discharge status: Home / Self Care | Payer: Self-pay | Source: Home / Self Care | Attending: General Surgery

## 2018-10-18 ENCOUNTER — Ambulatory Visit (HOSPITAL_COMMUNITY)
Admission: RE | Admit: 2018-10-18 | Discharge: 2018-10-18 | Disposition: A | Discharge status: Home / Self Care | Payer: Medicare HMO | Attending: General Surgery | Admitting: General Surgery

## 2018-10-18 ENCOUNTER — Encounter (HOSPITAL_COMMUNITY): Payer: Self-pay

## 2018-10-18 ENCOUNTER — Other Ambulatory Visit: Payer: Self-pay

## 2018-10-18 DIAGNOSIS — Z452 Encounter for adjustment and management of vascular access device: Secondary | ICD-10-CM | POA: Insufficient documentation

## 2018-10-18 DIAGNOSIS — Z6841 Body Mass Index (BMI) 40.0 and over, adult: Secondary | ICD-10-CM | POA: Diagnosis not present

## 2018-10-18 DIAGNOSIS — J45909 Unspecified asthma, uncomplicated: Secondary | ICD-10-CM | POA: Diagnosis not present

## 2018-10-18 DIAGNOSIS — F418 Other specified anxiety disorders: Secondary | ICD-10-CM | POA: Diagnosis not present

## 2018-10-18 DIAGNOSIS — Z853 Personal history of malignant neoplasm of breast: Secondary | ICD-10-CM | POA: Diagnosis not present

## 2018-10-18 HISTORY — DX: Anemia, unspecified: D64.9

## 2018-10-18 HISTORY — DX: Cardiac murmur, unspecified: R01.1

## 2018-10-18 HISTORY — PX: PORT-A-CATH REMOVAL: SHX5289

## 2018-10-18 HISTORY — DX: Nausea with vomiting, unspecified: R11.2

## 2018-10-18 HISTORY — DX: Radiation sickness, unspecified, initial encounter: T66.XXXA

## 2018-10-18 HISTORY — DX: Malignant (primary) neoplasm, unspecified: C80.1

## 2018-10-18 HISTORY — DX: Other specified postprocedural states: Z98.890

## 2018-10-18 LAB — CBC
HCT: 40.3 % (ref 36.0–46.0)
Hemoglobin: 12.2 g/dL (ref 12.0–15.0)
MCH: 22.4 pg — ABNORMAL LOW (ref 26.0–34.0)
MCHC: 30.3 g/dL (ref 30.0–36.0)
MCV: 74.1 fL — ABNORMAL LOW (ref 80.0–100.0)
Platelets: 324 10*3/uL (ref 150–400)
RBC: 5.44 MIL/uL — ABNORMAL HIGH (ref 3.87–5.11)
RDW: 15.3 % (ref 11.5–15.5)
WBC: 9.5 10*3/uL (ref 4.0–10.5)
nRBC: 0 % (ref 0.0–0.2)

## 2018-10-18 LAB — BASIC METABOLIC PANEL
Anion gap: 12 (ref 5–15)
BUN: 11 mg/dL (ref 6–20)
CO2: 25 mmol/L (ref 22–32)
Calcium: 9 mg/dL (ref 8.9–10.3)
Chloride: 101 mmol/L (ref 98–111)
Creatinine, Ser: 0.91 mg/dL (ref 0.44–1.00)
GFR calc Af Amer: >60 mL/min (ref >60)
GFR calc non Af Amer: >60 mL/min (ref >60)
Glucose, Bld: 115 mg/dL — ABNORMAL HIGH (ref 70–99)
Potassium: 5.3 mmol/L — ABNORMAL HIGH (ref 3.5–5.1)
Sodium: 138 mmol/L (ref 135–145)

## 2018-10-18 SURGERY — REMOVAL PORT-A-CATH
Anesthesia: Monitor Anesthesia Care | Site: Chest | Laterality: Left

## 2018-10-18 MED ORDER — MIDAZOLAM HCL 2 MG/2ML IJ SOLN
INTRAMUSCULAR | Status: AC
  Filled 2018-10-18: qty 2

## 2018-10-18 MED ORDER — CHLORHEXIDINE GLUCONATE CLOTH 2 % EX PADS: 6.0000 | MEDICATED_PAD | Freq: Once | CUTANEOUS | Status: DC

## 2018-10-18 MED ORDER — FENTANYL CITRATE (PF) 250 MCG/5ML IJ SOLN
INTRAMUSCULAR | Status: AC
  Filled 2018-10-18: qty 5

## 2018-10-18 MED ORDER — GABAPENTIN 300 MG PO CAPS
ORAL_CAPSULE | ORAL | Status: AC
  Administered 2018-10-18: 300 mg via ORAL
  Filled 2018-10-18: qty 1

## 2018-10-18 MED ORDER — BUPIVACAINE-EPINEPHRINE (PF) 0.25% -1:200000 IJ SOLN
INTRAMUSCULAR | Status: AC
  Filled 2018-10-18: qty 30

## 2018-10-18 MED ORDER — PROPOFOL 10 MG/ML IV BOLUS
INTRAVENOUS | Status: DC | PRN
  Administered 2018-10-18: 10 mg via INTRAVENOUS
  Administered 2018-10-18: 20 mg via INTRAVENOUS

## 2018-10-18 MED ORDER — MIDAZOLAM HCL 2 MG/2ML IJ SOLN
INTRAMUSCULAR | Status: DC | PRN
  Administered 2018-10-18: 2 mg via INTRAVENOUS

## 2018-10-18 MED ORDER — ONDANSETRON HCL 4 MG/2ML IJ SOLN
INTRAMUSCULAR | Status: DC | PRN
  Administered 2018-10-18: 4 mg via INTRAVENOUS

## 2018-10-18 MED ORDER — LACTATED RINGERS IV SOLN
INTRAVENOUS | Status: DC
  Administered 2018-10-18: 11:00:00 via INTRAVENOUS

## 2018-10-18 MED ORDER — STERILE WATER FOR IRRIGATION IR SOLN
Status: DC | PRN
  Administered 2018-10-18: 1000 mL

## 2018-10-18 MED ORDER — ACETAMINOPHEN 500 MG PO TABS
1000.0000 mg | ORAL_TABLET | ORAL | Status: AC
  Administered 2018-10-18: 1000 mg via ORAL

## 2018-10-18 MED ORDER — LIDOCAINE HCL (PF) 1 % IJ SOLN
INTRAMUSCULAR | Status: AC
  Filled 2018-10-18: qty 30

## 2018-10-18 MED ORDER — PROPOFOL 500 MG/50ML IV EMUL
INTRAVENOUS | Status: DC | PRN
  Administered 2018-10-18: 50 ug/kg/min via INTRAVENOUS

## 2018-10-18 MED ORDER — FENTANYL CITRATE (PF) 250 MCG/5ML IJ SOLN
INTRAMUSCULAR | Status: DC | PRN
  Administered 2018-10-18: 50 ug via INTRAVENOUS

## 2018-10-18 MED ORDER — 0.9 % SODIUM CHLORIDE (POUR BTL) OPTIME
TOPICAL | Status: DC | PRN
  Administered 2018-10-18: 1000 mL

## 2018-10-18 MED ORDER — ACETAMINOPHEN 500 MG PO TABS
ORAL_TABLET | ORAL | Status: AC
  Administered 2018-10-18: 11:00:00 1000 mg via ORAL
  Filled 2018-10-18: qty 2

## 2018-10-18 MED ORDER — OXYCODONE HCL 5 MG PO TABS: 2.5000 mg | ORAL_TABLET | Freq: Four times a day (QID) | ORAL | 0 refills | Status: DC | PRN

## 2018-10-18 MED ORDER — GABAPENTIN 300 MG PO CAPS
300.0000 mg | ORAL_CAPSULE | ORAL | Status: AC
  Administered 2018-10-18: 300 mg via ORAL

## 2018-10-18 MED ORDER — LIDOCAINE HCL (CARDIAC) PF 100 MG/5ML IV SOSY
PREFILLED_SYRINGE | INTRAVENOUS | Status: DC | PRN
  Administered 2018-10-18: 60 mg via INTRAVENOUS

## 2018-10-18 MED ORDER — LIDOCAINE HCL 1 % IJ SOLN
INTRAMUSCULAR | Status: DC | PRN
  Administered 2018-10-18: 14.5 mL

## 2018-10-18 SURGICAL SUPPLY — 32 items
ADH SKN CLS APL DERMABOND .7 (GAUZE/BANDAGES/DRESSINGS) ×1
CHLORAPREP W/TINT 10.5 ML (MISCELLANEOUS) ×3 IMPLANT
COVER SURGICAL LIGHT HANDLE (MISCELLANEOUS) ×3 IMPLANT
COVER WAND RF STERILE (DRAPES) ×3 IMPLANT
DECANTER SPIKE VIAL GLASS SM (MISCELLANEOUS) ×6 IMPLANT
DERMABOND ADVANCED (GAUZE/BANDAGES/DRESSINGS) ×2
DERMABOND ADVANCED .7 DNX12 (GAUZE/BANDAGES/DRESSINGS) ×1 IMPLANT
DRAPE LAPAROTOMY 100X72 PEDS (DRAPES) ×3 IMPLANT
ELECT REM PT RETURN 9FT ADLT (ELECTROSURGICAL) ×3
ELECTRODE REM PT RTRN 9FT ADLT (ELECTROSURGICAL) ×1 IMPLANT
GAUZE 4X4 16PLY RFD (DISPOSABLE) ×3 IMPLANT
GLOVE BIO SURGEON STRL SZ 6 (GLOVE) ×3 IMPLANT
GLOVE BIOGEL PI IND STRL 6.5 (GLOVE) IMPLANT
GLOVE BIOGEL PI INDICATOR 6.5 (GLOVE) ×2
GLOVE ECLIPSE 6.5 STRL STRAW (GLOVE) ×4 IMPLANT
GLOVE INDICATOR 6.5 STRL GRN (GLOVE) ×3 IMPLANT
GLOVE SURG SS PI 6.5 STRL IVOR (GLOVE) ×2 IMPLANT
GOWN STRL REUS W/ TWL LRG LVL3 (GOWN DISPOSABLE) ×1 IMPLANT
GOWN STRL REUS W/TWL 2XL LVL3 (GOWN DISPOSABLE) ×3 IMPLANT
GOWN STRL REUS W/TWL LRG LVL3 (GOWN DISPOSABLE) ×6
KIT BASIN OR (CUSTOM PROCEDURE TRAY) ×3 IMPLANT
KIT TURNOVER KIT B (KITS) ×3 IMPLANT
NDL HYPO 25GX1X1/2 BEV (NEEDLE) ×1 IMPLANT
NEEDLE HYPO 25GX1X1/2 BEV (NEEDLE) ×3 IMPLANT
NS IRRIG 1000ML POUR BTL (IV SOLUTION) ×3 IMPLANT
PACK GENERAL/GYN (CUSTOM PROCEDURE TRAY) ×3 IMPLANT
PAD ARMBOARD 7.5X6 YLW CONV (MISCELLANEOUS) ×6 IMPLANT
SUT MON AB 4-0 PC3 18 (SUTURE) ×3 IMPLANT
SUT VIC AB 3-0 SH 27 (SUTURE) ×3
SUT VIC AB 3-0 SH 27X BRD (SUTURE) ×1 IMPLANT
SYR CONTROL 10ML LL (SYRINGE) ×3 IMPLANT
TOWEL GREEN STERILE FF (TOWEL DISPOSABLE) ×3 IMPLANT

## 2018-10-18 NOTE — Op Note (Signed)
  PRE-OPERATIVE DIAGNOSIS:  un-needed Port-A-Cath for right breast cancer  POST-OPERATIVE DIAGNOSIS:  Same   PROCEDURE:  Procedure(s):  REMOVAL PORT-A-CATH  SURGEON:  Surgeon(s):  Stark Klein, MD  ANESTHESIA:   MAC + local  EBL:   Minimal  SPECIMEN:  None  Complications : none known  Procedure:   Pt was  identified in the holding area and taken to the operating room where she was placed supine on the operating room table.  MAC anesthesia was induced.  The left upper chest was prepped and draped.  The prior incision was anesthetized with local anesthetic.  The incision was opened with a #15 blade.  The subcutaneous tissue was divided with the cautery.  The port was identified and the capsule opened.  The four 2-0 prolene sutures were removed.  The port was then removed and pressure held on the tract.  The catheter appeared intact without evidence of breakage, length was 24 cm.  The wound was inspected for hemostasis, which was achieved with cautery.  The wound was closed with 3-0 vicryl deep dermal interrupted sutures and 4-0 Monocryl running subcuticular suture.  The wound was cleaned, dried, and dressed with dermabond.  The patient was awakened from anesthesia and taken to the PACU in stable condition.  Needle, sponge, and instrument counts are correct.

## 2018-10-18 NOTE — Anesthesia Preprocedure Evaluation (Signed)
Anesthesia Evaluation  Patient identified by MRN, date of birth, ID band Patient awake    Reviewed: Allergy & Precautions, NPO status , Patient's Chart, lab work & pertinent test results  History of Anesthesia Complications (+) PONV and history of anesthetic complications  Airway Mallampati: II  TM Distance: >3 FB Neck ROM: Full    Dental  (+) Partial Lower, Partial Upper   Pulmonary asthma ,    Pulmonary exam normal breath sounds clear to auscultation       Cardiovascular negative cardio ROS Normal cardiovascular exam Rhythm:Regular Rate:Normal     Neuro/Psych  Headaches, PSYCHIATRIC DISORDERS Anxiety Depression    GI/Hepatic negative GI ROS, Neg liver ROS,   Endo/Other  Morbid obesity  Renal/GU negative Renal ROS  negative genitourinary   Musculoskeletal  (+) Arthritis ,   Abdominal   Peds  Hematology  (+) Blood dyscrasia, anemia ,   Anesthesia Other Findings 58 yo F for port placement - PMH: asthma, breast cancer, BMI 55 - TTE 04/04/18: EF 50-55%, grade 1 dd, mod LAE  Reproductive/Obstetrics                             Anesthesia Physical  Anesthesia Plan  ASA: III  Anesthesia Plan: MAC   Post-op Pain Management:    Induction: Intravenous  PONV Risk Score and Plan: 3 and Ondansetron, Midazolam and Propofol infusion  Airway Management Planned: Nasal Cannula and Natural Airway  Additional Equipment: None  Intra-op Plan:   Post-operative Plan:   Informed Consent: I have reviewed the patients History and Physical, chart, labs and discussed the procedure including the risks, benefits and alternatives for the proposed anesthesia with the patient or authorized representative who has indicated his/her understanding and acceptance.     Dental advisory given  Plan Discussed with: CRNA  Anesthesia Plan Comments: (TTE 04/04/18 shows LVEF is low normal at 50-55%. No significant  valvular abnormalities. Abnormal global longitudinal strain: -14.7%.)        Anesthesia Quick Evaluation

## 2018-10-18 NOTE — H&P (Signed)
Kristina Rivera is an 58 y.o. female.   Chief Complaint: port in place HPI: Pt is a 58 yo F s/p tx for right breast cancer.  She desires port removal.    Past Medical History:  Diagnosis Date  . Anemia   . Anxiety    pt denies  . Arthritis    "back; knees; shoulders" (04/19/2013)  . Asthma    onset as adult  . Cancer [facility])    right breast cancer  . Chronic lower back pain   . Daily headache    "last couple months" (04/19/2013)  . Depression    during a personal crisis  . Family history of breast cancer   . Family history of lung cancer   . Heart murmur    as a child - no problems  . PONV (postoperative nausea and vomiting)    couple of days of nausea after lumpectomy  . Radiation disease    pt states she is very sensitive to electromagnetic things - makes her BP go very high, also causes Asthma symptoms     Past Surgical History:  Procedure Laterality Date  . BREAST LUMPECTOMY WITH AXILLARY LYMPH NODE BIOPSY Right 03/28/2018   Procedure: RIGHT BREAST LUMPECTOMY WITH RIGHT SENTINEL LYMPH NODE BIOPSY;  Surgeon: Stark Klein, MD;  Location: [place];  Service: General;  Laterality: Right;  . CARPAL TUNNEL RELEASE Left 1999?  Marland Kitchen PORTACATH PLACEMENT Left 05/14/2018   Procedure: INSERTION PORT-A-CATH;  Surgeon: Stark Klein, MD;  Location: [place];  Service: General;  Laterality: Left;  . TUBAL LIGATION  1993    Family History  Problem Relation Age of Onset  . Breast cancer Maternal Aunt 25  . Pneumonia Mother   . Lung cancer Mother 17  . Stroke Father   . Head & neck cancer Sister 30       neck  . Prostate cancer Neg Hx    Social History:  reports that she has never smoked. She has never used smokeless tobacco. She reports that she does not drink alcohol or use drugs.  Allergies:  Allergies  Allergen Reactions  . Aspirin Anaphylaxis and Hives  . Nsaids Anaphylaxis    Medications Prior to Admission  Medication Sig Dispense Refill  . acetaminophen (TYLENOL) 650 MG CR tablet  Take 650 mg by mouth every 8 (eight) hours as needed for pain.     Marland Kitchen OVER THE COUNTER MEDICATION Take 1 capsule by mouth daily with lunch. Siberian Chaga Extract    . OVER THE COUNTER MEDICATION Take 1 capsule by mouth daily. Gano Mushroom    . vitamin C (ASCORBIC ACID) 500 MG tablet Take 500 mg by mouth daily.    Marland Kitchen albuterol (VENTOLIN HFA) 108 (90 BASE) MCG/ACT inhaler Inhale 2 puffs into the lungs as needed. (Patient not taking: Reported on 10/15/2018) 1 Inhaler 6    Results for orders placed or performed during the hospital encounter of 10/18/18 (from the past 48 hour(s))  Basic metabolic panel     Status: Abnormal   Collection Time: 10/18/18 10:28 AM  Result Value Ref Range   Sodium 138 135 - 145 mmol/L   Potassium 5.3 (H) 3.5 - 5.1 mmol/L    Comment: HEMOLYSIS AT THIS LEVEL MAY AFFECT RESULT   Chloride 101 98 - 111 mmol/L   CO2 25 22 - 32 mmol/L   Glucose, Bld 115 (H) 70 - 99 mg/dL   BUN 11 6 - 20 mg/dL   Creatinine, Ser 0.91 0.44 -  1.00 mg/dL   Calcium 9.0 8.9 - 10.3 mg/dL   GFR calc non Af Amer >60 >60 mL/min   GFR calc Af Amer >60 >60 mL/min   Anion gap 12 5 - 15    Comment: Performed at [facility] [address], [place], [place] [address]  CBC     Status: Abnormal   Collection Time: 10/18/18 10:28 AM  Result Value Ref Range   WBC 9.5 4.0 - 10.5 K/uL   RBC 5.44 (H) 3.87 - 5.11 MIL/uL   Hemoglobin 12.2 12.0 - 15.0 g/dL   HCT 40.3 36.0 - 46.0 %   MCV 74.1 (L) 80.0 - 100.0 fL   MCH 22.4 (L) 26.0 - 34.0 pg   MCHC 30.3 30.0 - 36.0 g/dL   RDW 15.3 11.5 - 15.5 %   Platelets 324 150 - 400 K/uL   nRBC 0.0 0.0 - 0.2 %    Comment: Performed at [facility], [place] [address] [place], [place] [address]   No results found.  Review of Systems  All other systems reviewed and are negative.   Blood pressure 135/71, pulse 92, temperature 98.6 F (37 C), temperature source Oral, resp. rate 20, height 5\' 2"  (1.575 m), weight 136.1 kg, SpO2 97 %. Physical Exam   Constitutional: She is oriented to person, place, and time. She appears well-developed and well-nourished. No distress.  HENT:  Head: Normocephalic and atraumatic.  Eyes: Pupils are equal, round, and reactive to light. Conjunctivae are normal. No scleral icterus.  Neck: Normal range of motion. Neck supple.  Cardiovascular: Normal rate.  Respiratory: Effort normal.  Port in place left upper chest   GI: Soft.  Musculoskeletal: Normal range of motion.  Neurological: She is alert and oriented to person, place, and time.  Skin: Skin is warm and dry. She is not diaphoretic.  Psychiatric: She has a normal mood and affect. Her behavior is normal. Judgment and thought content normal.     Assessment/Plan Port in place Right breast cancer  Plan port removal. Reviewed surgery, risks, benefits, recovery, restrictions.    Stark Klein, MD 10/18/2018, 12:05 PM

## 2018-10-18 NOTE — Transfer of Care (Signed)
Immediate Anesthesia Transfer of Care Note  Patient: Kristina Rivera  Procedure(s) Performed: REMOVAL PORT-A-CATH (Left Chest)  Patient Location: PACU  Anesthesia Type:MAC  Level of Consciousness: awake, alert , oriented and patient cooperative  Airway & Oxygen Therapy: Patient Spontanous Breathing  Post-op Assessment: Report given to RN and Post -op Vital signs reviewed and stable  Post vital signs: Reviewed and stable  Last Vitals:  Vitals Value Taken Time  BP 114/75 10/18/18 1307  Temp    Pulse 71 10/18/18 1307  Resp 19 10/18/18 1307  SpO2 100 % 10/18/18 1307  Vitals shown include unvalidated device data.  Last Pain:  Vitals:   10/18/18 1015  TempSrc: Oral  PainSc: 5       Patients Stated Pain Goal: 0 (63/01/60 1093)  Complications: No apparent anesthesia complications

## 2018-10-18 NOTE — Discharge Instructions (Addendum)
[facility] °Office Phone Number [phone] ° ° POST OP INSTRUCTIONS ° °Always review your discharge instruction sheet given to you by the facility where your surgery was performed. ° °IF YOU HAVE DISABILITY OR FAMILY LEAVE FORMS, YOU MUST BRING THEM TO THE OFFICE FOR PROCESSING.  DO NOT GIVE THEM TO YOUR DOCTOR. ° °1. A prescription for pain medication may be given to you upon discharge.  Take your pain medication as prescribed, if needed.  If narcotic pain medicine is not needed, then you may take acetaminophen (Tylenol) or ibuprofen (Advil) as needed. °2. Take your usually prescribed medications unless otherwise directed °3. If you need a refill on your pain medication, please contact your pharmacy.  They will contact our office to request authorization.  Prescriptions will not be filled after 5pm or on week-ends. °4. You should eat very light the first 24 hours after surgery, such as soup, crackers, pudding, etc.  Resume your normal diet the day after surgery °5. It is common to experience some constipation if taking pain medication after surgery.  Increasing fluid intake and taking a stool softener will usually help or prevent this problem from occurring.  A mild laxative (Milk of Magnesia or Miralax) should be taken according to package directions if there are no bowel movements after 48 hours. °6. You may shower in 48 hours.  The surgical glue will flake off in 2-3 weeks.   °7. ACTIVITIES:  No strenuous activity or heavy lifting for 1 week.   °a. You may drive when you no longer are taking prescription pain medication, you can comfortably wear a seatbelt, and you can safely maneuver your car and apply brakes. °b. RETURN TO WORK:  __________1 week if applicable_______________ °You should see your doctor in the office for a follow-up appointment approximately three-four weeks after your surgery.   ° °WHEN TO CALL YOUR DOCTOR: °1. Fever over 101.0 °2. Nausea and/or vomiting. °3. Extreme  swelling or bruising. °4. Continued bleeding from incision. °5. Increased pain, redness, or drainage from the incision. ° °The clinic staff is available to answer your questions during regular business hours.  Please don’t hesitate to call and ask to speak to one of the nurses for clinical concerns.  If you have a medical emergency, go to the nearest emergency room or call 911.  A surgeon from [facility] is always on call at the hospital. ° °For further questions, please visit [email or web address]  ° °

## 2018-10-19 ENCOUNTER — Encounter (HOSPITAL_COMMUNITY): Payer: Self-pay | Admitting: General Surgery

## 2018-10-24 NOTE — Anesthesia Postprocedure Evaluation (Signed)
Anesthesia Post Note  Patient: Kristina Rivera  Procedure(s) Performed: REMOVAL PORT-A-CATH (Left Chest)     Patient location during evaluation: PACU Anesthesia Type: MAC Level of consciousness: awake and alert Pain management: pain level controlled Vital Signs Assessment: post-procedure vital signs reviewed and stable Respiratory status: spontaneous breathing, nonlabored ventilation, respiratory function stable and patient connected to nasal cannula oxygen Cardiovascular status: stable and blood pressure returned to baseline Postop Assessment: no apparent nausea or vomiting Anesthetic complications: no    Last Vitals:  Vitals:   10/18/18 1319 10/18/18 1330  BP:  118/77  Pulse: 67 60  Resp: 18 16  Temp:    SpO2: 94% 96%    Last Pain:  Vitals:   10/18/18 1330  TempSrc:   PainSc: 0-No pain                 Aayat Hajjar

## 2018-10-29 DIAGNOSIS — E559 Vitamin D deficiency, unspecified: Secondary | ICD-10-CM | POA: Diagnosis not present

## 2018-10-31 NOTE — Progress Notes (Signed)
[place]  [phone] [phone]    ID: TYSHIA FENTER DOB: 1959/02/28  MR#: [id number]  GDJ#:[id number]  Patient Care Team: Jonathon Jordan, MD as PCP - General (Family Medicine) Primus Gritton, [name], MD as Consulting Physician (Oncology) Stark Klein, MD as Consulting Physician (General Surgery) Moksh Loomer, [name], MD as Consulting Physician (Oncology) Gery Pray, MD as Consulting Physician (Radiation Oncology) Chauncey Cruel, MD OTHER MD:   CHIEF COMPLAINT: Triple negative breast cancer  CURRENT TREATMENT: observation  HISTORY OF CURRENT ILLNESS: The original intake note:  Kristina Rivera had palpated a lump in her left breast. She then underwent mammography on 02/23/2018 at the [facility] showing a possible abnormality in both the right and left breasts. She underwent bilateral diagnostic mammography with tomography and bilateral breast ultrasonography on 03/01/2018 showing: 3 cm mass in the 8 o'clock position of the right breast highly suspicious for breast malignancy; Two borderline abnormal right axillary lymph nodes with cortical thicknesses of 4 mm; indeterminate mass or lesion in the 2 o'clock retroareolar region of the left breast measuring 6 mm and no left axillary adenopathy.   Accordingly on 03/09/2018 she proceeded to biopsy of the breast areas in question. The pathology from this procedure ([id number]) showed: high grade carcinoma with necrosis in the right breast at 8 o'clock; benign right axilla lymph node; benign breast parenchyma in the left breast at 2 o'clock retroareolar. Prognostic indicators significant for: estrogen receptor, 0% negative and progesterone receptor, 0% negative. Proliferation marker Ki67 at 80%. HER2 equivocal (2+) by immunohistochemistry, but negative by fluorescent in situ hybridization with an initial analysis result with a signals ratio 1.22 and number per cell 4.4. A repeat analysis was completed  showing a signals ratio 1.13 and number per cell 4.43.   She underwent genetic screening on 03/20/2018 with results pending.  The patient's subsequent history is as detailed below.   INTERVAL HISTORY: [name] returns today for follow-up and treatment of her triple negative breast cancer. She was last seen by [place] on 07/18/2018.   She was started on adjuvant chemotherapy consisting of cyclophosphamide, methotrexate and fluorouracil given every 21 days and received 2 cycles of 8 planned.  After the 06/26/2018 cycle she decided to go off chemotherapy and canceled all appointments here.  She was then scheduled for consultation by Valaria Good at [facility] 09/11/2018.  However she tells me while driving to [place] they got lost, it became late, she felt very frustrated and she canceled the appointment.   On 10/18/2018 the patient had her Port-A-Cath removed by Dr. Barry Dienes.  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  She is to started to do yoga.  She does have an exercise bike at home which she uses perhaps twice a week.  At home is just she and her husband and they are both very careful regarding the current pandemic.  She is very concerned because they have built a telephone tower very near her home and of course this exacerbates her electro magnetic sensitivity.  She gets headaches, palpitations, high blood pressure, and feels tired.  Aside from these issues a detailed review of systems today was stable   PAST MEDICAL HISTORY: Past Medical History:  Diagnosis Date  . Anemia   . Anxiety    pt denies  .  Arthritis    "back; knees; shoulders" (04/19/2013)  . Asthma    onset as adult  . Cancer [facility])    right breast cancer  . Chronic lower back pain   . Daily headache    "last couple months" (04/19/2013)  . Depression    during a  personal crisis  . Family history of breast cancer   . Family history of lung cancer   . Heart murmur    as a child - no problems  . PONV (postoperative nausea and vomiting)    couple of days of nausea after lumpectomy  . Radiation disease    pt states she is very sensitive to electromagnetic things - makes her BP go very high, also causes Asthma symptoms     PAST SURGICAL HISTORY: Past Surgical History:  Procedure Laterality Date  . BREAST LUMPECTOMY WITH AXILLARY LYMPH NODE BIOPSY Right 03/28/2018   Procedure: RIGHT BREAST LUMPECTOMY WITH RIGHT SENTINEL LYMPH NODE BIOPSY;  Surgeon: Stark Klein, MD;  Location: [place];  Service: General;  Laterality: Right;  . CARPAL TUNNEL RELEASE Left 1999?  Marland Kitchen PORT-A-CATH REMOVAL Left 10/18/2018   Procedure: REMOVAL PORT-A-CATH;  Surgeon: Stark Klein, MD;  Location: [facility];  Service: General;  Laterality: Left;  . PORTACATH PLACEMENT Left 05/14/2018   Procedure: INSERTION PORT-A-CATH;  Surgeon: Stark Klein, MD;  Location: [place];  Service: General;  Laterality: Left;  . TUBAL LIGATION  1993    FAMILY HISTORY: Family History  Problem Relation Age of Onset  . Breast cancer Maternal Aunt 51  . Pneumonia Mother   . Lung cancer Mother 24  . Stroke Father   . Head & neck cancer Sister 30       neck  . Prostate cancer Neg Hx    She underwent commercial genetic testing and was told she is 25% of European Jewish ancestry. Patient father was 49 years old when he died from stroke. Patient mother died from pneumonia at age 44.  The patient notes a family hx of breast cancer in a maternal aunt diagnosed with breast cancer at age 21.  The patient has 9 siblings, 4 brothers and 5 sisters.  1 sister was diagnosed with neck cancer at age 17, and patient's mom with lung cancer at age 74.   GYNECOLOGIC HISTORY:  No LMP recorded. Patient is postmenopausal. Menarche: 58 years old Age at first live birth: 58 years old [place] P 5 LMP around age 41 Contraceptive:  never HRT: no  Hysterectomy? no So? no   SOCIAL HISTORY: She is on disability due to a back injury. Prior to that, she was a Quarry manager at [employer]. Her husband, Mariea Clonts, is retired. He was a Animal nutritionist. They have 5 children. Pascal Lux, age 12, is a Community education officer in [place], [place]. Orvis Brill, age 32, is a Education officer, museum here in [place]. Wendy Poet, age 75, is a school Engineer, water in [place], [place]. Silverio Decamp., age 11, is a Customer service manager here in [place]. [place], age 43, is a nanny in [place], [place]. She and her husband have two grandchildren, and her husband has 7 grandchildren and 7 great-grandchildren from 2 children from a prior marriage. They attend the [employer].  They tell me their religious affiliation does not involve any medical restrictions  ADVANCED DIRECTIVES: The patient and her husband are each others healthcare powers of attorney   HEALTH MAINTENANCE: Social History   Tobacco Use  .  Smoking status: Never Smoker  . Smokeless tobacco: Never Used  Substance Use Topics  . Alcohol use: No  . Drug use: No     Colonoscopy: 2010, Dr. Vella Kohler?  PAP: 07/2016  Bone density: more than 3 years ago   Allergies  Allergen Reactions  . Aspirin Anaphylaxis and Hives  . Nsaids Anaphylaxis    Current Outpatient Medications  Medication Sig Dispense Refill  . acetaminophen (TYLENOL) 650 MG CR tablet Take 650 mg by mouth every 8 (eight) hours as needed for pain.     Marland Kitchen albuterol (VENTOLIN HFA) 108 (90 BASE) MCG/ACT inhaler Inhale 2 puffs into the lungs as needed. (Patient not taking: Reported on 10/15/2018) 1 Inhaler 6  . cholecalciferol (VITAMIN D3) 25 MCG (1000 UT) tablet Take 1 tablet (1,000 Units total) by mouth daily.    Marland Kitchen OVER THE COUNTER MEDICATION Take 1 capsule by mouth daily with lunch. Siberian Chaga Extract    . OVER THE COUNTER MEDICATION Take 1 capsule by mouth daily.  Gano Mushroom    . vitamin C (ASCORBIC ACID) 500 MG tablet Take 500 mg by mouth daily.     No current facility-administered medications for this visit.     OBJECTIVE: Morbidly obese African-American woman in no acute distress  Vitals:   11/01/18 1118  BP: (!) 166/97  Pulse: 83  Resp: 18  Temp: 97.8 F (36.6 C)  SpO2: 94%    Body mass index is 56.21 kg/m.   Wt Readings from Last 3 Encounters:  11/01/18 (!) 307 lb 4.8 oz (139.4 kg)  10/18/18 300 lb (136.1 kg)  06/26/18 (!) 304 lb 11.2 oz (138.2 kg)  ECOG FS:1 - Symptomatic but completely ambulatory  Sclerae unicteric, EOMs intact Wearing a mask No cervical or supraclavicular adenopathy Lungs no rales or rhonchi Heart regular rate and rhythm Abd soft, nontender, positive bowel sounds MSK no focal spinal tenderness, no upper extremity lymphedema Neuro: nonfocal, well oriented, appropriate affect Breasts: The right breast is status post lumpectomy.  There is no evidence of local recurrence.  The left breast is benign.  Both axillae are benign   LAB RESULTS:  CMP     Component Value Date/Time   NA 141 11/01/2018 1040   K 4.3 11/01/2018 1040   CL 102 11/01/2018 1040   CO2 28 11/01/2018 1040   GLUCOSE 121 (H) 11/01/2018 1040   BUN 11 11/01/2018 1040   CREATININE 0.90 11/01/2018 1040   CREATININE 0.83 06/26/2018 1401   CREATININE 0.72 11/23/2012 1616   CALCIUM 9.6 11/01/2018 1040   PROT 8.1 11/01/2018 1040   ALBUMIN 3.7 11/01/2018 1040   AST 31 11/01/2018 1040   AST 25 06/26/2018 1401   ALT 21 11/01/2018 1040   ALT 19 06/26/2018 1401   ALKPHOS 80 11/01/2018 1040   BILITOT 0.6 11/01/2018 1040   BILITOT 0.4 06/26/2018 1401   GFRNONAA >60 11/01/2018 1040   GFRNONAA >60 06/26/2018 1401   GFRAA >60 11/01/2018 1040   GFRAA >60 06/26/2018 1401    No results found for: TOTALPROTELP, ALBUMINELP, A1GS, A2GS, BETS, BETA2SER, GAMS, MSPIKE, SPEI  No results found for: KPAFRELGTCHN, LAMBDASER, KAPLAMBRATIO  Lab Results   Component Value Date   WBC 8.9 11/01/2018   NEUTROABS 4.1 11/01/2018   HGB 12.8 11/01/2018   HCT 42.8 11/01/2018   MCV 74.2 (L) 11/01/2018   PLT 338 11/01/2018    _0 @  No results found for: LABCA2  No components found for: [id number]  No results for input(s): INR  in the last 168 hours.  No results found for: LABCA2  No results found for: ZWC585  No results found for: IDP824  No results found for: MPN361  No results found for: CA2729  No components found for: HGQUANT  No results found for: CEA1 / No results found for: CEA1   No results found for: AFPTUMOR  No results found for: CHROMOGRNA  No results found for: PSA1  Appointment on 11/01/2018  Component Date Value Ref Range Status  . Sodium 11/01/2018 141  135 - 145 mmol/L Final  . Potassium 11/01/2018 4.3  3.5 - 5.1 mmol/L Final  . Chloride 11/01/2018 102  98 - 111 mmol/L Final  . CO2 11/01/2018 28  22 - 32 mmol/L Final  . Glucose, Bld 11/01/2018 121* 70 - 99 mg/dL Final  . BUN 11/01/2018 11  6 - 20 mg/dL Final  . Creatinine, Ser 11/01/2018 0.90  0.44 - 1.00 mg/dL Final  . Calcium 11/01/2018 9.6  8.9 - 10.3 mg/dL Final  . Total Protein 11/01/2018 8.1  6.5 - 8.1 g/dL Final  . Albumin 11/01/2018 3.7  3.5 - 5.0 g/dL Final  . AST 11/01/2018 31  15 - 41 U/L Final  . ALT 11/01/2018 21  0 - 44 U/L Final  . Alkaline Phosphatase 11/01/2018 80  38 - 126 U/L Final  . Total Bilirubin 11/01/2018 0.6  0.3 - 1.2 mg/dL Final  . GFR calc non Af Amer 11/01/2018 >60  >60 mL/min Final  . GFR calc Af Amer 11/01/2018 >60  >60 mL/min Final  . Anion gap 11/01/2018 11  5 - 15 Final   Performed at [facility], [place] [address], [place], [place] [address]  . WBC 11/01/2018 8.9  4.0 - 10.5 K/uL Final  . RBC 11/01/2018 5.77* 3.87 - 5.11 MIL/uL Final  . Hemoglobin 11/01/2018 12.8  12.0 - 15.0 g/dL Final  . HCT 11/01/2018 42.8  36.0 - 46.0 % Final  . MCV 11/01/2018 74.2* 80.0 - 100.0 fL Final  . MCH  11/01/2018 22.2* 26.0 - 34.0 pg Final  . MCHC 11/01/2018 29.9* 30.0 - 36.0 g/dL Final  . RDW 11/01/2018 15.5  11.5 - 15.5 % Final  . Platelets 11/01/2018 338  150 - 400 K/uL Final  . nRBC 11/01/2018 0.0  0.0 - 0.2 % Final  . Neutrophils Relative % 11/01/2018 45  % Final  . Neutro Abs 11/01/2018 4.1  1.7 - 7.7 K/uL Final  . Lymphocytes Relative 11/01/2018 41  % Final  . Lymphs Abs 11/01/2018 3.7  0.7 - 4.0 K/uL Final  . Monocytes Relative 11/01/2018 8  % Final  . Monocytes Absolute 11/01/2018 0.7  0.1 - 1.0 K/uL Final  . Eosinophils Relative 11/01/2018 4  % Final  . Eosinophils Absolute 11/01/2018 0.3  0.0 - 0.5 K/uL Final  . Basophils Relative 11/01/2018 1  % Final  . Basophils Absolute 11/01/2018 0.1  0.0 - 0.1 K/uL Final  . Immature Granulocytes 11/01/2018 1  % Final  . Abs Immature Granulocytes 11/01/2018 0.06  0.00 - 0.07 K/uL Final   Performed at [facility], [place] [address], [place], [place] [address]    (this displays the last labs from the last 3 days)  No results found for: TOTALPROTELP, ALBUMINELP, A1GS, A2GS, BETS, BETA2SER, GAMS, MSPIKE, SPEI (this displays SPEP labs)  No results found for: KPAFRELGTCHN, LAMBDASER, KAPLAMBRATIO (kappa/lambda light chains)  No results found for: HGBA, HGBA2QUANT, HGBFQUANT, HGBSQUAN (Hemoglobinopathy evaluation)   No  results found for: LDH  No results found for: IRON, TIBC, IRONPCTSAT (Iron and TIBC)  No results found for: FERRITIN  Urinalysis No results found for: COLORURINE, APPEARANCEUR, LABSPEC, PHURINE, GLUCOSEU, HGBUR, BILIRUBINUR, KETONESUR, PROTEINUR, UROBILINOGEN, NITRITE, LEUKOCYTESUR   STUDIES: No results found.   ELIGIBLE FOR AVAILABLE RESEARCH PROTOCOL: no   ASSESSMENT: 58 y.o. [place] woman status post right breast lower outer quadrant biopsy 03/09/2018 for a clinical T2N0, stage IIB invasive ductal carcinoma, triple negative, with an MIB-1 of 80%  (1) Genetic testing performed  through [facility] Common Hereditary Cancers Panel + EGFR on 03/21/2018 showed no pathogenic mutations in APC, ATM, AXIN2, BARD1, BMPR1A, BRCA1, BRCA2, BRIP1, BUB1B, CDH1, CDK4, CDKN2A, CHEK2, CTNNA1, DICER1, ENG, EPCAM, GALNT12, GREM1, HOXB13, KIT, MEN1, MLH1, MLH3, MSH2, MSH3, MSH6, MUTYH, NBN, NF1, NTHL1, PALB2, PDGFRA, PMS2, POLD1, POLE, PTEN, RAD50, RAD51C, RAD51D, RNF43, RPS20, SDHA, SDHB, SDHC, SDHD, SMAD4, SMARCA4, STK11, TP53, TSC1, TSC2, VHL.   (a) A variant of uncertain significance (VUS) in a gene called ATM was also noted. [id number]>G (p.[id number]).  (2) status post right lumpectomy and sentinel lymph node sampling 03/28/2018 for a pT2 pN0, stage IIB invasive ductal carcinoma, with medullary features, negative margins.  (3) adjuvant chemotherapy consisting of cyclophosphamide, methotrexate and fluorouracil given every 21 days started 05/16/2018, discontinued after 2 cycles by patient (last dose 06/26/2018)  (3) patient opted against adjuvant radiation   (4) likely thalassemia   PLAN: Zoriana is now 8 months out from definitive surgery for her breast cancer.  There is no evidence of disease recurrence.  She understands that she only had 2 of the 8 planned chemotherapy cycles.  Accordingly I cannot say that she got significant benefit or did not get significant benefit from that.  If we do not follow a recipe we may not get the right kind of result.  Nevertheless at this point she is not a candidate for further chemotherapy or radiation and we are initiating observation.  She will have mammography in December.  She is agreeable to that.  She will see me again in February, which is 6 months from now.  If she then sees Dr. Barry Dienes a year from now, August 2021, we can continue to "tag team her" in that fashion until she completes the 5 years of follow-up  She is interested in exploring whether [facility] has information on the electromagnetic sensitivity issue.  She understands unfortunately I am  not knowledgeable in that regard.    Otherwise I commended her on her pandemic precautions.  She knows to call for any other issues that may develop before the next visit.  Jaunita Mikels, [name], MD  11/01/18 11:29 AM Medical Oncology and Hematology [facility] [address] [place], [place] [address] Tel. [phone]    Fax. [phone]  I, Jacqualyn Posey am acting as a Education administrator for Chauncey Cruel, MD.   I, Lurline Del MD, have reviewed the above documentation for accuracy and completeness, and I agree with the above.

## 2018-11-01 ENCOUNTER — Inpatient Hospital Stay: Payer: Medicare HMO | Attending: Oncology | Admitting: Oncology

## 2018-11-01 ENCOUNTER — Other Ambulatory Visit: Payer: Self-pay

## 2018-11-01 ENCOUNTER — Inpatient Hospital Stay: Payer: Medicare HMO

## 2018-11-01 ENCOUNTER — Encounter: Payer: Self-pay | Admitting: *Deleted

## 2018-11-01 VITALS — BP 166/97 | HR 83 | Temp 97.8°F | Resp 18 | Ht 62.0 in | Wt 307.3 lb

## 2018-11-01 DIAGNOSIS — Z853 Personal history of malignant neoplasm of breast: Secondary | ICD-10-CM | POA: Diagnosis not present

## 2018-11-01 DIAGNOSIS — C50511 Malignant neoplasm of lower-outer quadrant of right female breast: Secondary | ICD-10-CM

## 2018-11-01 DIAGNOSIS — Z171 Estrogen receptor negative status [ER-]: Secondary | ICD-10-CM | POA: Diagnosis not present

## 2018-11-01 DIAGNOSIS — D573 Sickle-cell trait: Secondary | ICD-10-CM

## 2018-11-01 DIAGNOSIS — D569 Thalassemia, unspecified: Secondary | ICD-10-CM | POA: Insufficient documentation

## 2018-11-01 DIAGNOSIS — D560 Alpha thalassemia: Secondary | ICD-10-CM

## 2018-11-01 LAB — COMPREHENSIVE METABOLIC PANEL
ALT: 21 U/L (ref 0–44)
AST: 31 U/L (ref 15–41)
Albumin: 3.7 g/dL (ref 3.5–5.0)
Alkaline Phosphatase: 80 U/L (ref 38–126)
Anion gap: 11 (ref 5–15)
BUN: 11 mg/dL (ref 6–20)
CO2: 28 mmol/L (ref 22–32)
Calcium: 9.6 mg/dL (ref 8.9–10.3)
Chloride: 102 mmol/L (ref 98–111)
Creatinine, Ser: 0.9 mg/dL (ref 0.44–1.00)
GFR calc Af Amer: >60 mL/min (ref >60)
GFR calc non Af Amer: >60 mL/min (ref >60)
Glucose, Bld: 121 mg/dL — ABNORMAL HIGH (ref 70–99)
Potassium: 4.3 mmol/L (ref 3.5–5.1)
Sodium: 141 mmol/L (ref 135–145)
Total Bilirubin: 0.6 mg/dL (ref 0.3–1.2)
Total Protein: 8.1 g/dL (ref 6.5–8.1)

## 2018-11-01 LAB — CBC WITH DIFFERENTIAL/PLATELET
Abs Immature Granulocytes: 0.06 10*3/uL (ref 0.00–0.07)
Basophils Absolute: 0.1 10*3/uL (ref 0.0–0.1)
Basophils Relative: 1 %
Eosinophils Absolute: 0.3 10*3/uL (ref 0.0–0.5)
Eosinophils Relative: 4 %
HCT: 42.8 % (ref 36.0–46.0)
Hemoglobin: 12.8 g/dL (ref 12.0–15.0)
Immature Granulocytes: 1 %
Lymphocytes Relative: 41 %
Lymphs Abs: 3.7 10*3/uL (ref 0.7–4.0)
MCH: 22.2 pg — ABNORMAL LOW (ref 26.0–34.0)
MCHC: 29.9 g/dL — ABNORMAL LOW (ref 30.0–36.0)
MCV: 74.2 fL — ABNORMAL LOW (ref 80.0–100.0)
Monocytes Absolute: 0.7 10*3/uL (ref 0.1–1.0)
Monocytes Relative: 8 %
Neutro Abs: 4.1 10*3/uL (ref 1.7–7.7)
Neutrophils Relative %: 45 %
Platelets: 338 10*3/uL (ref 150–400)
RBC: 5.77 MIL/uL — ABNORMAL HIGH (ref 3.87–5.11)
RDW: 15.5 % (ref 11.5–15.5)
WBC: 8.9 10*3/uL (ref 4.0–10.5)
nRBC: 0 % (ref 0.0–0.2)

## 2018-11-02 ENCOUNTER — Telehealth: Payer: Self-pay | Admitting: Oncology

## 2018-11-02 NOTE — Telephone Encounter (Signed)
I left a message regarding schedule  

## 2018-11-02 NOTE — Telephone Encounter (Signed)
Scheduled SCP per 8/20 sch message - pt to get an updated schedule next visit.

## 2019-03-16 ENCOUNTER — Encounter (HOSPITAL_COMMUNITY): Payer: Self-pay | Admitting: Emergency Medicine

## 2019-03-16 ENCOUNTER — Emergency Department (HOSPITAL_COMMUNITY): Payer: Medicare HMO

## 2019-03-16 ENCOUNTER — Other Ambulatory Visit: Payer: Self-pay

## 2019-03-16 ENCOUNTER — Inpatient Hospital Stay (HOSPITAL_COMMUNITY)
Admission: EM | Admit: 2019-03-16 | Discharge: 2019-03-19 | DRG: 177 | Disposition: A | Discharge status: Home Health | Payer: Medicare HMO | Attending: Internal Medicine | Admitting: Internal Medicine

## 2019-03-16 DIAGNOSIS — Z853 Personal history of malignant neoplasm of breast: Secondary | ICD-10-CM | POA: Diagnosis not present

## 2019-03-16 DIAGNOSIS — U071 COVID-19: Secondary | ICD-10-CM | POA: Diagnosis not present

## 2019-03-16 DIAGNOSIS — J45901 Unspecified asthma with (acute) exacerbation: Secondary | ICD-10-CM | POA: Diagnosis present

## 2019-03-16 DIAGNOSIS — G8929 Other chronic pain: Secondary | ICD-10-CM | POA: Diagnosis not present

## 2019-03-16 DIAGNOSIS — Z923 Personal history of irradiation: Secondary | ICD-10-CM

## 2019-03-16 DIAGNOSIS — Z801 Family history of malignant neoplasm of trachea, bronchus and lung: Secondary | ICD-10-CM

## 2019-03-16 DIAGNOSIS — Z823 Family history of stroke: Secondary | ICD-10-CM

## 2019-03-16 DIAGNOSIS — M1990 Unspecified osteoarthritis, unspecified site: Secondary | ICD-10-CM | POA: Diagnosis not present

## 2019-03-16 DIAGNOSIS — T781XXA Other adverse food reactions, not elsewhere classified, initial encounter: Secondary | ICD-10-CM | POA: Diagnosis not present

## 2019-03-16 DIAGNOSIS — R7981 Abnormal blood-gas level: Secondary | ICD-10-CM

## 2019-03-16 DIAGNOSIS — Z803 Family history of malignant neoplasm of breast: Secondary | ICD-10-CM | POA: Diagnosis not present

## 2019-03-16 DIAGNOSIS — Z886 Allergy status to analgesic agent status: Secondary | ICD-10-CM

## 2019-03-16 DIAGNOSIS — Z6841 Body Mass Index (BMI) 40.0 and over, adult: Secondary | ICD-10-CM

## 2019-03-16 DIAGNOSIS — Z20822 Contact with and (suspected) exposure to covid-19: Secondary | ICD-10-CM

## 2019-03-16 DIAGNOSIS — R509 Fever, unspecified: Secondary | ICD-10-CM | POA: Diagnosis not present

## 2019-03-16 DIAGNOSIS — M545 Low back pain: Secondary | ICD-10-CM | POA: Diagnosis present

## 2019-03-16 DIAGNOSIS — J9601 Acute respiratory failure with hypoxia: Secondary | ICD-10-CM | POA: Diagnosis not present

## 2019-03-16 DIAGNOSIS — J1282 Pneumonia due to coronavirus disease 2019: Secondary | ICD-10-CM | POA: Diagnosis present

## 2019-03-16 DIAGNOSIS — R0902 Hypoxemia: Secondary | ICD-10-CM | POA: Diagnosis not present

## 2019-03-16 DIAGNOSIS — R011 Cardiac murmur, unspecified: Secondary | ICD-10-CM | POA: Diagnosis present

## 2019-03-16 DIAGNOSIS — R05 Cough: Secondary | ICD-10-CM | POA: Diagnosis not present

## 2019-03-16 DIAGNOSIS — Z171 Estrogen receptor negative status [ER-]: Secondary | ICD-10-CM

## 2019-03-16 DIAGNOSIS — C50511 Malignant neoplasm of lower-outer quadrant of right female breast: Secondary | ICD-10-CM

## 2019-03-16 DIAGNOSIS — J45909 Unspecified asthma, uncomplicated: Secondary | ICD-10-CM | POA: Diagnosis present

## 2019-03-16 LAB — CBC WITH DIFFERENTIAL/PLATELET
Abs Immature Granulocytes: 0.02 10*3/uL (ref 0.00–0.07)
Basophils Absolute: 0 10*3/uL (ref 0.0–0.1)
Basophils Relative: 1 %
Eosinophils Absolute: 0 10*3/uL (ref 0.0–0.5)
Eosinophils Relative: 0 %
HCT: 44.2 % (ref 36.0–46.0)
Hemoglobin: 12.9 g/dL (ref 12.0–15.0)
Immature Granulocytes: 0 %
Lymphocytes Relative: 45 %
Lymphs Abs: 2.5 10*3/uL (ref 0.7–4.0)
MCH: 21.6 pg — ABNORMAL LOW (ref 26.0–34.0)
MCHC: 29.2 g/dL — ABNORMAL LOW (ref 30.0–36.0)
MCV: 74.2 fL — ABNORMAL LOW (ref 80.0–100.0)
Monocytes Absolute: 0.4 10*3/uL (ref 0.1–1.0)
Monocytes Relative: 6 %
Neutro Abs: 2.7 10*3/uL (ref 1.7–7.7)
Neutrophils Relative %: 48 %
Platelets: 262 10*3/uL (ref 150–400)
RBC: 5.96 MIL/uL — ABNORMAL HIGH (ref 3.87–5.11)
RDW: 15.8 % — ABNORMAL HIGH (ref 11.5–15.5)
WBC: 5.6 10*3/uL (ref 4.0–10.5)
nRBC: 0 % (ref 0.0–0.2)

## 2019-03-16 LAB — I-STAT BETA HCG BLOOD, ED (MC, WL, AP ONLY): I-stat hCG, quantitative: <5 m[IU]/mL (ref <5)

## 2019-03-16 LAB — COMPREHENSIVE METABOLIC PANEL
ALT: 24 U/L (ref 0–44)
AST: 39 U/L (ref 15–41)
Albumin: 3.4 g/dL — ABNORMAL LOW (ref 3.5–5.0)
Alkaline Phosphatase: 66 U/L (ref 38–126)
Anion gap: 11 (ref 5–15)
BUN: 10 mg/dL (ref 6–20)
CO2: 26 mmol/L (ref 22–32)
Calcium: 8.9 mg/dL (ref 8.9–10.3)
Chloride: 105 mmol/L (ref 98–111)
Creatinine, Ser: 0.89 mg/dL (ref 0.44–1.00)
GFR calc Af Amer: >60 mL/min (ref >60)
GFR calc non Af Amer: >60 mL/min (ref >60)
Glucose, Bld: 112 mg/dL — ABNORMAL HIGH (ref 70–99)
Potassium: 4 mmol/L (ref 3.5–5.1)
Sodium: 142 mmol/L (ref 135–145)
Total Bilirubin: 0.6 mg/dL (ref 0.3–1.2)
Total Protein: 8 g/dL (ref 6.5–8.1)

## 2019-03-16 LAB — CBC
HCT: 45.7 % (ref 36.0–46.0)
Hemoglobin: 13.6 g/dL (ref 12.0–15.0)
MCH: 21.9 pg — ABNORMAL LOW (ref 26.0–34.0)
MCHC: 29.8 g/dL — ABNORMAL LOW (ref 30.0–36.0)
MCV: 73.5 fL — ABNORMAL LOW (ref 80.0–100.0)
Platelets: 258 K/uL (ref 150–400)
RBC: 6.22 MIL/uL — ABNORMAL HIGH (ref 3.87–5.11)
RDW: 16.8 % — ABNORMAL HIGH (ref 11.5–15.5)
WBC: 4.8 K/uL (ref 4.0–10.5)
nRBC: 0 % (ref 0.0–0.2)

## 2019-03-16 LAB — HIV ANTIBODY (ROUTINE TESTING W REFLEX): HIV Screen 4th Generation wRfx: NONREACTIVE

## 2019-03-16 LAB — URINALYSIS, ROUTINE W REFLEX MICROSCOPIC
Bacteria, UA: NONE SEEN
Bilirubin Urine: NEGATIVE
Glucose, UA: NEGATIVE mg/dL
Hgb urine dipstick: NEGATIVE
Ketones, ur: NEGATIVE mg/dL
Leukocytes,Ua: NEGATIVE
Nitrite: NEGATIVE
Protein, ur: 30 mg/dL — AB
Specific Gravity, Urine: 1.025 (ref 1.005–1.030)
pH: 5 (ref 5.0–8.0)

## 2019-03-16 LAB — RESPIRATORY PANEL BY RT PCR (FLU A&B, COVID)
Influenza A by PCR: NEGATIVE
Influenza B by PCR: NEGATIVE
SARS Coronavirus 2 by RT PCR: POSITIVE — AB

## 2019-03-16 LAB — CREATININE, SERUM
Creatinine, Ser: 0.96 mg/dL (ref 0.44–1.00)
GFR calc Af Amer: >60 mL/min (ref >60)
GFR calc non Af Amer: >60 mL/min (ref >60)

## 2019-03-16 LAB — TRIGLYCERIDES
Triglycerides: 116 mg/dL (ref <150)
Triglycerides: 96 mg/dL (ref <150)

## 2019-03-16 LAB — LACTIC ACID, PLASMA
Lactic Acid, Venous: 1.8 mmol/L (ref 0.5–1.9)
Lactic Acid, Venous: 1.3 mmol/L (ref 0.5–1.9)

## 2019-03-16 LAB — PROCALCITONIN: Procalcitonin: <0.1 ng/mL

## 2019-03-16 LAB — POC SARS CORONAVIRUS 2 AG -  ED: SARS Coronavirus 2 Ag: NEGATIVE

## 2019-03-16 LAB — LACTATE DEHYDROGENASE: LDH: 356 U/L — ABNORMAL HIGH (ref 98–192)

## 2019-03-16 LAB — FIBRINOGEN: Fibrinogen: 686 mg/dL — ABNORMAL HIGH (ref 210–475)

## 2019-03-16 LAB — D-DIMER, QUANTITATIVE: D-Dimer, Quant: 0.65 ug/mL-FEU — ABNORMAL HIGH (ref 0.00–0.50)

## 2019-03-16 LAB — C-REACTIVE PROTEIN: CRP: 7.2 mg/dL — ABNORMAL HIGH (ref <1.0)

## 2019-03-16 LAB — FERRITIN: Ferritin: 250 ng/mL (ref 11–307)

## 2019-03-16 MED ORDER — DEXTROSE-NACL 5-0.45 % IV SOLN: INTRAVENOUS | Status: DC

## 2019-03-16 MED ORDER — DEXAMETHASONE SODIUM PHOSPHATE 10 MG/ML IJ SOLN
10.0000 mg | INTRAMUSCULAR | Status: DC
  Administered 2019-03-16 – 2019-03-17 (×2): 10 mg via INTRAVENOUS
  Filled 2019-03-16 (×2): qty 1

## 2019-03-16 MED ORDER — ACETAMINOPHEN 325 MG PO TABS
650.0000 mg | ORAL_TABLET | Freq: Four times a day (QID) | ORAL | Status: DC | PRN
  Administered 2019-03-17: 650 mg via ORAL
  Filled 2019-03-16: qty 2

## 2019-03-16 MED ORDER — SODIUM CHLORIDE 0.9 % IV SOLN
1.0000 g | INTRAVENOUS | Status: DC
  Administered 2019-03-16: 23:00:00 1 g via INTRAVENOUS
  Filled 2019-03-16: qty 10

## 2019-03-16 MED ORDER — ENOXAPARIN SODIUM 40 MG/0.4ML ~~LOC~~ SOLN
40.0000 mg | SUBCUTANEOUS | Status: DC
  Administered 2019-03-17 – 2019-03-18 (×2): 40 mg via SUBCUTANEOUS
  Filled 2019-03-16 (×2): qty 0.4

## 2019-03-16 MED ORDER — DEXAMETHASONE SODIUM PHOSPHATE 10 MG/ML IJ SOLN
10.0000 mg | Freq: Once | INTRAMUSCULAR | Status: AC
  Administered 2019-03-16: 18:00:00 10 mg via INTRAVENOUS
  Filled 2019-03-16: qty 1

## 2019-03-16 MED ORDER — SODIUM CHLORIDE 0.9 % IV SOLN
500.0000 mg | INTRAVENOUS | Status: DC
  Administered 2019-03-17: 500 mg via INTRAVENOUS
  Filled 2019-03-16: qty 500

## 2019-03-16 MED ORDER — ALBUTEROL SULFATE HFA 108 (90 BASE) MCG/ACT IN AERS
2.0000 | INHALATION_SPRAY | Freq: Once | RESPIRATORY_TRACT | Status: AC
  Administered 2019-03-16: 17:00:00 2 via RESPIRATORY_TRACT
  Filled 2019-03-16: qty 6.7

## 2019-03-16 MED ORDER — ONDANSETRON HCL 4 MG PO TABS: 4.0000 mg | ORAL_TABLET | Freq: Four times a day (QID) | ORAL | Status: DC | PRN

## 2019-03-16 MED ORDER — ACETAMINOPHEN 500 MG PO TABS
500.0000 mg | ORAL_TABLET | Freq: Once | ORAL | Status: AC
  Administered 2019-03-16: 17:00:00 500 mg via ORAL
  Filled 2019-03-16: qty 1

## 2019-03-16 MED ORDER — ONDANSETRON HCL 4 MG/2ML IJ SOLN: 4.0000 mg | Freq: Four times a day (QID) | INTRAMUSCULAR | Status: DC | PRN

## 2019-03-16 MED ORDER — SODIUM CHLORIDE 0.9% FLUSH
3.0000 mL | Freq: Once | INTRAVENOUS | Status: AC
  Administered 2019-03-16: 17:00:00 3 mL via INTRAVENOUS

## 2019-03-16 MED ORDER — ACETAMINOPHEN 650 MG RE SUPP: 650.0000 mg | Freq: Four times a day (QID) | RECTAL | Status: DC | PRN

## 2019-03-16 NOTE — H&P (Signed)
History and Physical   Kristina Rivera [id number] DOB: 12-02-1960 DOA: 03/16/2019  Referring MD/NP/PA: Dr. Bobby Rumpf  PCP: Jonathon Jordan, MD   Outpatient Specialists: None  Patient coming from: Home  Chief Complaint: Shortness of breath and cough  HPI: Kristina Rivera is a 59 y.o. female with medical history significant of morbid obesity, asthma, osteoarthritis, depression, chronic low back pain, breast cancer status post radiation and treatment who was recently exposed to COVID-19 through her daughters in law.  She came to the ER with respiratory symptoms.  She was having cold chills as well as shortness of breath.  Patient requiring 2 L of oxygen at the moment.  Screening for COVID-19 was done which came back positive.  Chest x-ray showed no evidence of active pneumonia but patient appears to have COVID-19 infection with new oxygen demand so she has been admitted to the hospital for treatment..  ED Course: Temperature is 99.3 blood pressure 139/82 pulse 94 respiratory rate of 25 oxygen sat 91% on room air and 96% 2 L.  Chemistry and CBC appear all to be within normal.  D-dimer 0.65 fibrinogen 686.  Urinalysis negative.  Chest x-ray showed negative without acute abnormalities.  Patient being admitted to the hospital for treatment  Review of Systems: As per HPI otherwise 10 point review of systems negative.    Past Medical History:  Diagnosis Date  . Anemia   . Anxiety    pt denies  . Arthritis    "back; knees; shoulders" (04/19/2013)  . Asthma    onset as adult  . Cancer [facility])    right breast cancer  . Chronic lower back pain   . Daily headache    "last couple months" (04/19/2013)  . Depression    during a personal crisis  . Family history of breast cancer   . Family history of lung cancer   . Heart murmur    as a child - no problems  . PONV (postoperative nausea and vomiting)    couple of days of nausea after lumpectomy  . Radiation disease    pt states she is very  sensitive to electromagnetic things - makes her BP go very high, also causes Asthma symptoms     Past Surgical History:  Procedure Laterality Date  . BREAST LUMPECTOMY WITH AXILLARY LYMPH NODE BIOPSY Right 03/28/2018   Procedure: RIGHT BREAST LUMPECTOMY WITH RIGHT SENTINEL LYMPH NODE BIOPSY;  Surgeon: Stark Klein, MD;  Location: [place];  Service: General;  Laterality: Right;  . CARPAL TUNNEL RELEASE Left 1999?  Marland Kitchen PORT-A-CATH REMOVAL Left 10/18/2018   Procedure: REMOVAL PORT-A-CATH;  Surgeon: Stark Klein, MD;  Location: [place];  Service: General;  Laterality: Left;  . PORTACATH PLACEMENT Left 05/14/2018   Procedure: INSERTION PORT-A-CATH;  Surgeon: Stark Klein, MD;  Location: [place];  Service: General;  Laterality: Left;  . TUBAL LIGATION  1993     reports that she has never smoked. She has never used smokeless tobacco. She reports that she does not drink alcohol or use drugs.  Allergies  Allergen Reactions  . Aspirin Anaphylaxis and Hives  . Nsaids Anaphylaxis    Family History  Problem Relation Age of Onset  . Breast cancer Maternal Aunt 75  . Pneumonia Mother   . Lung cancer Mother 17  . Stroke Father   . Head & neck cancer Sister 30       neck  . Prostate cancer Neg Hx      Prior to Admission medications  Medication Sig Start Date End Date Taking? Authorizing Provider  acetaminophen (TYLENOL) 650 MG CR tablet Take 650 mg by mouth every 8 (eight) hours as needed for pain.     [provider]  albuterol (VENTOLIN HFA) 108 (90 BASE) MCG/ACT inhaler Inhale 2 puffs into the lungs as needed. Patient not taking: Reported on 10/15/2018 11/23/12   [place], MD  cholecalciferol (VITAMIN D3) 25 MCG (1000 UT) tablet Take 1 tablet (1,000 Units total) by mouth daily. 11/01/18   Magrinat, [name], MD  OVER THE COUNTER MEDICATION Take 1 capsule by mouth daily with lunch. [place] Metallurgist, Historical, MD  OVER THE COUNTER MEDICATION Take 1 capsule  by mouth daily. Gano Mushroom    [provider]  vitamin C (ASCORBIC ACID) 500 MG tablet Take 500 mg by mouth daily.    [provider]    Physical Exam: Vitals:   03/16/19 2015 03/16/19 2045 03/16/19 2100 03/16/19 2115  BP:   113/64 92/72  Pulse: 71 76 73 70  Resp: 16 15 (!) 22   Temp:      TempSrc:      SpO2: 95% 92% 96% 93%      Constitutional: Morbidly obese, very anxious Vitals:   03/16/19 2015 03/16/19 2045 03/16/19 2100 03/16/19 2115  BP:   113/64 92/72  Pulse: 71 76 73 70  Resp: 16 15 (!) 22   Temp:      TempSrc:      SpO2: 95% 92% 96% 93%   Eyes: PERRL, lids and conjunctivae normal ENMT: Mucous membranes are moist. Posterior pharynx clear of any exudate or lesions.Normal dentition.  Neck: normal, supple, no masses, no thyromegaly Respiratory: Coarse breath sounds with rhonchi bilaterally, no wheezing, no crackles. Normal respiratory effort. No accessory muscle use.  Cardiovascular: Regular rate and rhythm, no murmurs / rubs / gallops. No extremity edema. 2+ pedal pulses. No carotid bruits.  Abdomen: no tenderness, no masses palpated. No hepatosplenomegaly. Bowel sounds positive.  Musculoskeletal: no clubbing / cyanosis. No joint deformity upper and lower extremities. Good ROM, no contractures. Normal muscle tone.  Skin: no rashes, lesions, ulcers. No induration Neurologic: CN 2-12 grossly intact. Sensation intact, DTR normal. Strength 5/5 in all 4.  Psychiatric: Normal judgment and insight. Alert and oriented x 3. Normal mood.     Labs on Admission: I have personally reviewed following labs and imaging studies  CBC: Recent Labs  Lab 03/16/19 1411  WBC 5.6  NEUTROABS 2.7  HGB 12.9  HCT 44.2  MCV 74.2*  PLT 99991111   Basic Metabolic Panel: Recent Labs  Lab 03/16/19 1411  NA 142  K 4.0  CL 105  CO2 26  GLUCOSE 112*  BUN 10  CREATININE 0.89  CALCIUM 8.9   GFR: CrCl cannot be calculated (Unknown ideal weight.). Liver Function  Tests: Recent Labs  Lab 03/16/19 1411  AST 39  ALT 24  ALKPHOS 66  BILITOT 0.6  PROT 8.0  ALBUMIN 3.4*   No results for input(s): LIPASE, AMYLASE in the last 168 hours. No results for input(s): AMMONIA in the last 168 hours. Coagulation Profile: No results for input(s): INR, PROTIME in the last 168 hours. Cardiac Enzymes: No results for input(s): CKTOTAL, CKMB, CKMBINDEX, TROPONINI in the last 168 hours. BNP (last 3 results) No results for input(s): PROBNP in the last 8760 hours. HbA1C: No results for input(s): HGBA1C in the last 72 hours. CBG: No results for input(s): GLUCAP in  the last 168 hours. Lipid Profile: Recent Labs    03/16/19 1757  TRIG 116   Thyroid Function Tests: No results for input(s): TSH, T4TOTAL, FREET4, T3FREE, THYROIDAB in the last 72 hours. Anemia Panel: Recent Labs    03/16/19 1757  FERRITIN 250   Urine analysis:    Component Value Date/Time   COLORURINE AMBER (A) 03/16/2019 1741   APPEARANCEUR HAZY (A) 03/16/2019 1741   LABSPEC 1.025 03/16/2019 1741   PHURINE 5.0 03/16/2019 1741   GLUCOSEU NEGATIVE 03/16/2019 1741   HGBUR NEGATIVE 03/16/2019 1741   BILIRUBINUR NEGATIVE 03/16/2019 1741   KETONESUR NEGATIVE 03/16/2019 1741   PROTEINUR 30 (A) 03/16/2019 1741   NITRITE NEGATIVE 03/16/2019 1741   LEUKOCYTESUR NEGATIVE 03/16/2019 1741   Sepsis Labs: @LABRCNTIP (procalcitonin:4,lacticidven:4) )No results found for this or any previous visit (from the past 240 hour(s)).   Radiological Exams on Admission: DG Chest Portable 1 View  Result Date: 03/16/2019 CLINICAL DATA:  Cough, fever, body aches EXAM: PORTABLE CHEST 1 VIEW COMPARISON:  05/14/2018 FINDINGS: Cardiomegaly. Both lungs are clear. The visualized skeletal structures are unremarkable. IMPRESSION: Cardiomegaly without acute abnormality of the lungs in AP portable projection. Electronically Signed   By: Eddie Candle M.D.   On: 03/16/2019 17:06    EKG: Independently reviewed.  It shows  normal sinus rhythm with no significant ST changes  Assessment/Plan Principal Problem:   Acute respiratory failure with hypoxia (HCC) Active Problems:   Asthma   Malignant neoplasm of lower-outer quadrant of right breast of female, estrogen receptor negative ([place])   Morbid obesity with BMI of 45.0-49.9, adult (HCC)     #1 shortness of breath with mild hypoxia: Probably COVID-19 pneumonia on top of patient's asthma.  At this point she is on 2 L of oxygen.  She desats when she moves.  Initiate IV steroids antibiotics and her home treatment with inhalers.  No remdesivir or other cardiac medications as patient has no apparent infiltrates.  #2 asthma: Mild exacerbation.  Resume inhalers.  Continue as above  #3 morbid obesity: Dietary counseling  #4 history of breast cancer: At this point continue follow-up with her home oncologist.   DVT prophylaxis: Lovenox Code Status: Full code Family Communication: No family at bedside Disposition Plan: Home Consults called: None Admission status: Inpatient  Severity of Illness: The appropriate patient status for this patient is INPATIENT. Inpatient status is judged to be reasonable and necessary in order to provide the required intensity of service to ensure the patient's safety. The patient's presenting symptoms, physical exam findings, and initial radiographic and laboratory data in the context of their chronic comorbidities is felt to place them at high risk for further clinical deterioration. Furthermore, it is not anticipated that the patient will be medically stable for discharge from the hospital within 2 midnights of admission. The following factors support the patient status of inpatient.   " The patient's presenting symptoms include shortness of breath and cough with fever. " The worrisome physical exam findings include mild crackles. " The initial radiographic and laboratory data are worrisome because of positive COVID-19 test. " The  chronic co-morbidities include asthma.   * I certify that at the point of admission it is my clinical judgment that the patient will require inpatient hospital care spanning beyond 2 midnights from the point of admission due to high intensity of service, high risk for further deterioration and high frequency of surveillance required.Barbette Merino MD [facility] Pager [phone]  If 7PM-7AM, please  contact night-coverage [email or web address] Password [facility]  03/16/2019, 9:24 PM

## 2019-03-16 NOTE — ED Triage Notes (Signed)
Pt reports cough, fevers, body aches since 12/20, states family she was around at [place] tested positive for covid a few days ago.

## 2019-03-16 NOTE — ED Notes (Signed)
Kristina Rivera (Husband#[phone]) called for an update.

## 2019-03-16 NOTE — ED Provider Notes (Signed)
ED ECG REPORT   Date: 03/16/2019  Rate: 90  Rhythm: normal sinus rhythm  QRS Axis: normal  Intervals: normal  ST/T Wave abnormalities: normal  Conduction Disutrbances:none  Narrative Interpretation:   Old EKG Reviewed: none available In addition borderline left axis deviation borderline repolarization abnormality.  Multiform ventricular premature complexes.  I have personally reviewed the EKG tracing and agree with the computerized printout as noted.    Fredia Sorrow, MD 03/16/19 [phone]

## 2019-03-16 NOTE — ED Provider Notes (Signed)
[place] EMERGENCY DEPARTMENT Provider Note   CSN: [id number] Arrival date & time: 03/16/19  1334     History Chief Complaint  Patient presents with  . Cough  . Fever  . Generalized Body Aches    Kristina Rivera is a 59 y.o. female with a pmh of anemia, anxiety, arthritis, depression, hx of breast cancer, who presents w/ cc of flu like sxs. Patient and her daughter have been ill for 2 weeks with cough, body aches, chills, diarrhea. Her daughter was recently tested for COVID 19 and tested positive. She has a hx of radiation disease and states that she is very sensitive to electromagnetic fields so "all of the monitors are making things worse." She has been taking theraflu at home with minimal relief. She has bee using her albuterol inhaler a few times a day.  HPI     Past Medical History:  Diagnosis Date  . Anemia   . Anxiety    pt denies  . Arthritis    "back; knees; shoulders" (04/19/2013)  . Asthma    onset as adult  . Cancer [facility])    right breast cancer  . Chronic lower back pain   . Daily headache    "last couple months" (04/19/2013)  . Depression    during a personal crisis  . Family history of breast cancer   . Family history of lung cancer   . Heart murmur    as a child - no problems  . PONV (postoperative nausea and vomiting)    couple of days of nausea after lumpectomy  . Radiation disease    pt states she is very sensitive to electromagnetic things - makes her BP go very high, also causes Asthma symptoms     Patient Active Problem List   Diagnosis Date Noted  . Sickle cell trait ([place]) 11/01/2018  . Thalassemia 11/01/2018  . Radiation disease 06/26/2018  . Costochondritis 05/08/2018  . Genetic testing 03/30/2018  . Family history of breast cancer   . Family history of lung cancer   . Morbid obesity with BMI of 45.0-49.9, adult ([place]) 03/21/2018  . Malignant neoplasm of lower-outer quadrant of right breast of female, estrogen receptor  negative ([place]) 03/15/2018  . Chest pain 04/19/2013  . Post-menopausal bleeding 04/30/2012  . Back pain 03/26/2012  . Depression 03/09/2012  . Right knee pain 02/10/2011  . PANIC ATTACK 06/22/2009  . RHINITIS, ALLERGIC 05/11/2006  . ASTHMA, INTERMITTENT 05/11/2006    Past Surgical History:  Procedure Laterality Date  . BREAST LUMPECTOMY WITH AXILLARY LYMPH NODE BIOPSY Right 03/28/2018   Procedure: RIGHT BREAST LUMPECTOMY WITH RIGHT SENTINEL LYMPH NODE BIOPSY;  Surgeon: Stark Klein, MD;  Location: [place];  Service: General;  Laterality: Right;  . CARPAL TUNNEL RELEASE Left 1999?  Marland Kitchen PORT-A-CATH REMOVAL Left 10/18/2018   Procedure: REMOVAL PORT-A-CATH;  Surgeon: Stark Klein, MD;  Location: [place];  Service: General;  Laterality: Left;  . PORTACATH PLACEMENT Left 05/14/2018   Procedure: INSERTION PORT-A-CATH;  Surgeon: Stark Klein, MD;  Location: [place];  Service: General;  Laterality: Left;  . TUBAL LIGATION  1993     OB History   No obstetric history on file.     Family History  Problem Relation Age of Onset  . Breast cancer Maternal Aunt 9  . Pneumonia Mother   . Lung cancer Mother 18  . Stroke Father   . Head & neck cancer Sister 30       neck  .  Prostate cancer Neg Hx     Social History   Tobacco Use  . Smoking status: Never Smoker  . Smokeless tobacco: Never Used  Substance Use Topics  . Alcohol use: No  . Drug use: No    Home Medications Prior to Admission medications   Medication Sig Start Date End Date Taking? Authorizing Provider  acetaminophen (TYLENOL) 650 MG CR tablet Take 650 mg by mouth every 8 (eight) hours as needed for pain.     [provider]  albuterol (VENTOLIN HFA) 108 (90 BASE) MCG/ACT inhaler Inhale 2 puffs into the lungs as needed. Patient not taking: Reported on 10/15/2018 11/23/12   [place], MD  cholecalciferol (VITAMIN D3) 25 MCG (1000 UT) tablet Take 1 tablet (1,000 Units total) by mouth daily. 11/01/18   Magrinat,  [name], MD  OVER THE COUNTER MEDICATION Take 1 capsule by mouth daily with lunch. [place] Metallurgist, Historical, MD  OVER THE COUNTER MEDICATION Take 1 capsule by mouth daily. Gano Mushroom    [provider]  vitamin C (ASCORBIC ACID) 500 MG tablet Take 500 mg by mouth daily.    [provider]    Allergies    Aspirin and Nsaids  Review of Systems   Review of Systems Ten systems reviewed and are negative for acute change, except as noted in the HPI.   Physical Exam Updated Vital Signs BP 128/76 (BP Location: Left Arm)   Pulse 90   Temp 99.3 F (37.4 C) (Oral)   Resp (!) 25   SpO2 91%   Physical Exam Vitals and nursing note reviewed.  Constitutional:      General: She is not in acute distress.    Appearance: She is well-developed. She is not diaphoretic.  HENT:     Head: Normocephalic and atraumatic.  Eyes:     General: No scleral icterus.    Conjunctiva/sclera: Conjunctivae normal.  Cardiovascular:     Rate and Rhythm: Normal rate and regular rhythm.     Heart sounds: Normal heart sounds. No murmur. No friction rub. No gallop.   Pulmonary:     Effort: Pulmonary effort is normal. No respiratory distress.     Breath sounds: Normal breath sounds.  Abdominal:     General: Bowel sounds are normal. There is no distension.     Palpations: Abdomen is soft. There is no mass.     Tenderness: There is no abdominal tenderness. There is no guarding.  Musculoskeletal:     Cervical back: Normal range of motion.  Skin:    General: Skin is warm and dry.  Neurological:     Mental Status: She is alert and oriented to person, place, and time.  Psychiatric:        Behavior: Behavior normal.     ED Results / Procedures / Treatments   Labs (all labs ordered are listed, but only abnormal results are displayed) Labs Reviewed  COMPREHENSIVE METABOLIC PANEL - Abnormal; Notable for the following components:      Result Value   Glucose, Bld 112 (*)     Albumin 3.4 (*)    All other components within normal limits  CBC WITH DIFFERENTIAL/PLATELET - Abnormal; Notable for the following components:   RBC 5.96 (*)    MCV 74.2 (*)    MCH 21.6 (*)    MCHC 29.2 (*)    RDW 15.8 (*)    All other components within normal limits  URINALYSIS, ROUTINE  W REFLEX MICROSCOPIC  I-STAT BETA HCG BLOOD, ED ([facility], [facility], AP ONLY)    EKG EKG Interpretation  Date/Time:  Saturday March 16 2019 17:25:18 EST Ventricular Rate:  87 PR Interval:    QRS Duration: 96 QT Interval:  386 QTC Calculation: 465 R Axis:   -22 Text Interpretation: Sinus rhythm Borderline left axis deviation Borderline T abnormalities, anterior leads Confirmed by Fredia Sorrow [phone]) on 03/16/2019 6:07:34 PM   Radiology No results found.  Procedures Procedures (including critical care time)  Medications Ordered in ED Medications  sodium chloride flush (NS) 0.9 % injection 3 mL (has no administration in time range)    ED Course  I have reviewed the triage vital signs and the nursing notes.  Pertinent labs & imaging results that were available during my care of the patient were reviewed by me and considered in my medical decision making (see chart for details).  Clinical Course as of Mar 15 1812  Sat Mar 16, 2019  1746 Urinalysis, Routine w reflex microscopic [AH]    Clinical Course User Index [AH] Margarita Mail, PA-C   MDM Rules/Calculators/A&P                      This is a morbidly obese female who presents with flulike symptoms for 2 weeks.  She is also complaining of shortness of breath.The emergent differential diagnosis for shortness of breath includes, but is not limited to, Pulmonary edema, bronchoconstriction, Pneumonia, Pulmonary embolism, Pneumotherax/ Hemothorax, Dysrythmia, ACS, and obesity hypoventilation syndrome.  Patient's labs show UA without evidence of infection.  CBC shows microcytosis without anemia.  Negative pregnancy test.  Initial point-of-care  Covid test negative however patient has elevated inflammatory markers.  Her lactic acid is within normal limits.  I personally reviewed the patient's portable chest x-ray which shows no acute abnormalities on my interpretation.  EKG shows normal sinus rhythm with a rate of 87.  The patient has had persistently low oxygen saturations and tachypnea.  She will be admitted to the hospitalist service as a patient under investigation.  BRAYLAH BRANDLI was evaluated in Emergency Department on 03/16/2019 for the symptoms described in the history of present illness. She was evaluated in the context of the global COVID-19 pandemic, which necessitated consideration that the patient might be at risk for infection with the SARS-CoV-2 virus that causes COVID-19. Institutional protocols and algorithms that pertain to the evaluation of patients at risk for COVID-19 are in a state of rapid change based on information released by regulatory bodies including the CDC and federal and state organizations. These policies and algorithms were followed during the patient's care in the ED.   Final Clinical Impression(s) / ED Diagnoses Final diagnoses:  None    Rx / DC Orders ED Discharge Orders    None       Margarita Mail, PA-C 03/16/19 2140    Fredia Sorrow, MD 03/24/19 0930

## 2019-03-17 MED ORDER — SODIUM CHLORIDE 0.9 % IV SOLN
100.0000 mg | Freq: Every day | INTRAVENOUS | Status: DC
  Administered 2019-03-18 – 2019-03-19 (×2): 100 mg via INTRAVENOUS
  Filled 2019-03-17 (×2): qty 20

## 2019-03-17 MED ORDER — METHYLPREDNISOLONE SODIUM SUCC 125 MG IJ SOLR
125.0000 mg | Freq: Once | INTRAMUSCULAR | Status: AC
  Administered 2019-03-17: 125 mg via INTRAVENOUS
  Filled 2019-03-17: qty 2

## 2019-03-17 MED ORDER — VITAMIN D 25 MCG (1000 UNIT) PO TABS
1000.0000 [IU] | ORAL_TABLET | Freq: Every day | ORAL | Status: DC
  Administered 2019-03-17 – 2019-03-19 (×3): 1000 [IU] via ORAL
  Filled 2019-03-17 (×3): qty 1

## 2019-03-17 MED ORDER — ZINC SULFATE 220 (50 ZN) MG PO CAPS
220.0000 mg | ORAL_CAPSULE | Freq: Every day | ORAL | Status: DC
  Administered 2019-03-17 – 2019-03-19 (×3): 220 mg via ORAL
  Filled 2019-03-17 (×3): qty 1

## 2019-03-17 MED ORDER — DEXAMETHASONE SODIUM PHOSPHATE 10 MG/ML IJ SOLN
10.0000 mg | Freq: Every day | INTRAMUSCULAR | Status: DC
  Administered 2019-03-18 – 2019-03-19 (×2): 10 mg via INTRAVENOUS
  Filled 2019-03-17 (×2): qty 1

## 2019-03-17 MED ORDER — SODIUM CHLORIDE 0.9 % IV SOLN
200.0000 mg | Freq: Once | INTRAVENOUS | Status: AC
  Administered 2019-03-17: 200 mg via INTRAVENOUS
  Filled 2019-03-17: qty 200

## 2019-03-17 MED ORDER — ALBUTEROL SULFATE HFA 108 (90 BASE) MCG/ACT IN AERS
2.0000 | INHALATION_SPRAY | Freq: Four times a day (QID) | RESPIRATORY_TRACT | Status: DC
  Administered 2019-03-18 – 2019-03-19 (×5): 2 via RESPIRATORY_TRACT

## 2019-03-17 MED ORDER — FISH OIL ADULT GUMMIES 113.5 MG PO CHEW: CHEWABLE_TABLET | Freq: Every day | ORAL | Status: DC

## 2019-03-17 MED ORDER — FAMOTIDINE IN NACL 20-0.9 MG/50ML-% IV SOLN
20.0000 mg | INTRAVENOUS | Status: DC
  Filled 2019-03-17 (×2): qty 50

## 2019-03-17 MED ORDER — DIPHENHYDRAMINE HCL 25 MG PO CAPS
25.0000 mg | ORAL_CAPSULE | Freq: Four times a day (QID) | ORAL | Status: DC | PRN
  Filled 2019-03-17: qty 1

## 2019-03-17 MED ORDER — DIPHENHYDRAMINE HCL 50 MG/ML IJ SOLN
25.0000 mg | Freq: Once | INTRAMUSCULAR | Status: AC
  Administered 2019-03-17: 25 mg via INTRAVENOUS
  Filled 2019-03-17: qty 1

## 2019-03-17 MED ORDER — ALBUTEROL SULFATE HFA 108 (90 BASE) MCG/ACT IN AERS
2.0000 | INHALATION_SPRAY | Freq: Four times a day (QID) | RESPIRATORY_TRACT | Status: DC
  Administered 2019-03-17 (×2): 2 via RESPIRATORY_TRACT
  Filled 2019-03-17: qty 6.7

## 2019-03-17 MED ORDER — AEROCHAMBER PLUS FLO-VU LARGE MISC
Status: AC
  Administered 2019-03-17: 1
  Filled 2019-03-17: qty 1

## 2019-03-17 MED ORDER — FAMOTIDINE IN NACL 20-0.9 MG/50ML-% IV SOLN
20.0000 mg | Freq: Once | INTRAVENOUS | Status: AC
  Administered 2019-03-17: 20 mg via INTRAVENOUS
  Filled 2019-03-17: qty 50

## 2019-03-17 MED ORDER — ASCORBIC ACID 500 MG PO TABS
500.0000 mg | ORAL_TABLET | Freq: Every day | ORAL | Status: DC
  Administered 2019-03-17 – 2019-03-19 (×3): 500 mg via ORAL
  Filled 2019-03-17 (×3): qty 1

## 2019-03-17 NOTE — ED Notes (Signed)
Pt stated she is sensitive to electromagnet energy sources and that they were currently causing her discomfort. Moved IV pole and vital monitor away from pt. Pt also placed lava talisman on chest to help.

## 2019-03-17 NOTE — ED Notes (Signed)
Tele   Breakfast ordered  

## 2019-03-17 NOTE — ED Notes (Signed)
Pt reports having an allergic reaction to mushrooms in dinner tray. 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 Admitting MD paged. Pt took PO benadryl from home. Verbal orders for 125 mg of solumedrol IV, 25mg  of benadryl IV, and 20mg  pepcid IV.

## 2019-03-17 NOTE — Progress Notes (Addendum)
PROGRESS NOTE    [name]  [id number] DOB: Jan 25, 1961 DOA: 03/16/2019 PCP: Kristina Jordan, MD   Brief Narrative: 59 year old with past medical history significant for morbid obesity, asthma, depression breast cancer status post radiation and treatment who was recently exposed to Covid through her daughter-in-law.  Present with cold chills and shortness of breath.  Patient requiring 2 L of oxygen on admission.  Chest x-ray showed no evidence of active pneumonia.  Patient present with new oxygen requirement.    Assessment & Plan:   Principal Problem:   Acute respiratory failure with hypoxia (HCC) Active Problems:   Asthma   Malignant neoplasm of lower-outer quadrant of right breast of female, estrogen receptor negative ([place])   Morbid obesity with BMI of 45.0-49.9, adult (HCC)  1-Acute Hypoxic respiratory failure: Secondary to COVID-19 likely pneumonia -Although chest x-ray was negative for infiltrate, I will start Remdesivir, patient is symptomatic.  -Continue with Dexamethasone.  -Follow Blood cultures.  -continue with Vitamin C, D and Zinc.  -Chest x-ray no infiltrate, procalcitonin level less than 0.10.  Will discontinue IV antibiotics. COVID-19 Labs  Recent Labs    03/16/19 1757  DDIMER 0.65*  FERRITIN 250  LDH 356*  CRP 7.2*    Lab Results  Component Value Date   SARSCOV2NAA POSITIVE (A) 03/16/2019   [place] NEGATIVE 10/15/2018   2-Asthma;  Continue with inhalers  Morbid obesity: Need counseling History of breast cancer need to follow-up with her oncologist.  Allergic reaction; mild episode.  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  Her dinner had mushroom, she put a pice in her mouth and realize it was mushroom and spit it out.  She develops burning sensation and swelling of her lips. She took a benadryl 25 mg tablet from her purse.  I order IV benadryl 25 mg, Solumedrol 125 mg and pepcid. I came evaluated patient, she reported swelling  resolved and denies any tingling.  Will monitor closely.  Will order PRN benadryl.    Estimated body mass index is 56.21 kg/m as calculated from the following:   Height as of 11/01/18: 5\' 2"  (1.575 m).   Weight as of 11/01/18: 139.4 kg.   DVT prophylaxis: Lovenox Code Status: Full code  family Communication: Care discussed with patient Disposition Plan: Remain in the hospital for treatment of acute hypoxic respiratory failure secondary to COVID-19 viral illness Consultants:   None  Procedures:   None  Antimicrobials:    Subjective: Patient report some improvement of her symptoms.  She report that she is breathing a little bit better.  Objective: Vitals:   03/17/19 0400 03/17/19 0500 03/17/19 0600 03/17/19 0630  BP: (!) 116/58 110/62 138/67 139/77  Pulse: (!) 52 (!) 58 (!) 56 (!) 51  Resp: 20 15  14   Temp:      TempSrc:      SpO2: (!) 81% 90% 92% 97%   No intake or output data in the 24 hours ending 03/17/19 0810 There were no vitals filed for this visit.  Examination:  General exam: Appears calm and comfortable  Respiratory system: Clear to auscultation. Respiratory effort normal. Cardiovascular system: S1 & S2 heard, RRR. No JVD, murmurs, rubs, gallops or clicks. No pedal edema. Gastrointestinal system: Abdomen is nondistended, soft and nontender. No organomegaly or masses felt. Normal bowel sounds heard. Central nervous system: Alert and oriented. No focal neurological deficits. Extremities: Symmetric 5 x 5 power. Skin: No rashes, lesions or ulcers Psychiatry: Judgement and insight appear normal. Mood & affect appropriate.  Data Reviewed: I have personally reviewed following labs and imaging studies  CBC: Recent Labs  Lab 03/16/19 1411 03/16/19 2148  WBC 5.6 4.8  NEUTROABS 2.7  --   HGB 12.9 13.6  HCT 44.2 45.7  MCV 74.2* 73.5*  PLT 262 0000000   Basic Metabolic Panel: Recent Labs  Lab 03/16/19 1411 03/16/19 2148  NA 142  --   K 4.0  --   CL  105  --   CO2 26  --   GLUCOSE 112*  --   BUN 10  --   CREATININE 0.89 0.96  CALCIUM 8.9  --    GFR: CrCl cannot be calculated (Unknown ideal weight.). Liver Function Tests: Recent Labs  Lab 03/16/19 1411  AST 39  ALT 24  ALKPHOS 66  BILITOT 0.6  PROT 8.0  ALBUMIN 3.4*   No results for input(s): LIPASE, AMYLASE in the last 168 hours. No results for input(s): AMMONIA in the last 168 hours. Coagulation Profile: No results for input(s): INR, PROTIME in the last 168 hours. Cardiac Enzymes: No results for input(s): CKTOTAL, CKMB, CKMBINDEX, TROPONINI in the last 168 hours. BNP (last 3 results) No results for input(s): PROBNP in the last 8760 hours. HbA1C: No results for input(s): HGBA1C in the last 72 hours. CBG: No results for input(s): GLUCAP in the last 168 hours. Lipid Profile: Recent Labs    03/16/19 1757 03/16/19 2148  TRIG 116 96   Thyroid Function Tests: No results for input(s): TSH, T4TOTAL, FREET4, T3FREE, THYROIDAB in the last 72 hours. Anemia Panel: Recent Labs    03/16/19 1757  FERRITIN 250   Sepsis Labs: Recent Labs  Lab 03/16/19 1757 03/16/19 1911  PROCALCITON <0.10  --   LATICACIDVEN 1.8 1.3    Recent Results (from the past 240 hour(s))  Respiratory Panel by RT PCR (Flu A&B, Covid) - Nasopharyngeal Swab     Status: Abnormal   Collection Time: 03/16/19  7:11 PM   Specimen: Nasopharyngeal Swab  Result Value Ref Range Status   SARS Coronavirus 2 by RT PCR POSITIVE (A) NEGATIVE Final    Comment: RESULT CALLED TO, READ BACK BY AND VERIFIED WITH: RBeatris Si 2210 03/16/2019 T. TYSOR (NOTE) SARS-CoV-2 target nucleic acids are DETECTED. SARS-CoV-2 RNA is generally detectable in upper respiratory specimens  during the acute phase of infection. Positive results are indicative of the presence of the identified virus, but do not rule out bacterial infection or co-infection with other pathogens not detected by the test. Clinical correlation with  patient history and other diagnostic information is necessary to determine patient infection status. The expected result is Negative. Fact Sheet for Patients:  PinkCheek.be Fact Sheet for Healthcare Providers: GravelBags.it This test is not yet approved or cleared by the [place] FDA and  has been authorized for detection and/or diagnosis of SARS-CoV-2 by FDA under an Emergency Use Authorization (EUA).  This EUA will remain in effect (meaning this test can be used ) for the duration of  the COVID-19 declaration under Section 564(b)(1) of the Act, 21 U.S.C. section 360bbb-3(b)(1), unless the authorization is terminated or revoked sooner.    Influenza A by PCR NEGATIVE NEGATIVE Final   Influenza B by PCR NEGATIVE NEGATIVE Final    Comment: (NOTE) The Xpert Xpress SARS-CoV-2/FLU/RSV assay is intended as an aid in  the diagnosis of influenza from Nasopharyngeal swab specimens and  should not be used as a sole basis for treatment. Nasal washings and  aspirates are unacceptable for Xpert Xpress  SARS-CoV-2/FLU/RSV  testing. Fact Sheet for Patients: PinkCheek.be Fact Sheet for Healthcare Providers: GravelBags.it This test is not yet approved or cleared by the [place] FDA and  has been authorized for detection and/or diagnosis of SARS-CoV-2 by  FDA under an Emergency Use Authorization (EUA). This EUA will remain  in effect (meaning this test can be used) for the duration of the  Covid-19 declaration under Section 564(b)(1) of the Act, 21  U.S.C. section 360bbb-3(b)(1), unless the authorization is  terminated or revoked. Performed at [facility], [place] [address] [place], [place] [address]          Radiology Studies: DG Chest Portable 1 View  Result Date: 03/16/2019 CLINICAL DATA:  Cough, fever, body aches EXAM: PORTABLE CHEST 1 VIEW COMPARISON:   05/14/2018 FINDINGS: Cardiomegaly. Both lungs are clear. The visualized skeletal structures are unremarkable. IMPRESSION: Cardiomegaly without acute abnormality of the lungs in AP portable projection. Electronically Signed   By: Eddie Candle M.D.   On: 03/16/2019 17:06        Scheduled Meds: . dexamethasone (DECADRON) injection  10 mg Intravenous Q24H  . enoxaparin (LOVENOX) injection  40 mg Subcutaneous Q24H   Continuous Infusions: . azithromycin Stopped (03/17/19 0206)  . cefTRIAXone (ROCEPHIN)  IV Stopped (03/16/19 2342)  . dextrose 5 % and 0.45% NaCl 100 mL/hr at 03/16/19 2154     LOS: 1 day    Time spent: 35 minutes    Kristina Rivera A Bronda Alfred, MD [facility]   If 7PM-7AM, please contact night-coverage [email or web address] Password [email or web address] 03/17/2019, 8:10 AM

## 2019-03-17 NOTE — Progress Notes (Signed)
PHARMACY - PHYSICIAN COMMUNICATION CRITICAL VALUE ALERT - BLOOD CULTURE IDENTIFICATION (BCID)  Kristina Rivera is an 59 y.o. female who presented to [facility] on 03/16/2019 with a chief complaint of SOB. She is noted covid +  Assessment: blood cultures show GPC/clusters in 1/4 bottles.   Name of physician (or Provider) Contacted: Dr. Tyrell Antonio  Current antibiotics: none  Changes to prescribed antibiotics recommended:  -no antibiotics needed now  No results found for this or any previous visit.  Hildred Laser, PharmD Clinical Pharmacist **Pharmacist phone directory can now be found on [email or web address] (PW [email or web address]).  Listed under [place].

## 2019-03-17 NOTE — ED Notes (Signed)
Pt placed on 2l [place] due to falling O2 sats

## 2019-03-18 LAB — CBC
HCT: 40.7 % (ref 36.0–46.0)
Hemoglobin: 12.1 g/dL (ref 12.0–15.0)
MCH: 21.7 pg — ABNORMAL LOW (ref 26.0–34.0)
MCHC: 29.7 g/dL — ABNORMAL LOW (ref 30.0–36.0)
MCV: 73.1 fL — ABNORMAL LOW (ref 80.0–100.0)
Platelets: 323 10*3/uL (ref 150–400)
RBC: 5.57 MIL/uL — ABNORMAL HIGH (ref 3.87–5.11)
RDW: 15.5 % (ref 11.5–15.5)
WBC: 13.1 10*3/uL — ABNORMAL HIGH (ref 4.0–10.5)
nRBC: 0 % (ref 0.0–0.2)

## 2019-03-18 LAB — COMPREHENSIVE METABOLIC PANEL
ALT: 31 U/L (ref 0–44)
AST: 39 U/L (ref 15–41)
Albumin: 3.1 g/dL — ABNORMAL LOW (ref 3.5–5.0)
Alkaline Phosphatase: 64 U/L (ref 38–126)
Anion gap: 10 (ref 5–15)
BUN: 15 mg/dL (ref 6–20)
CO2: 27 mmol/L (ref 22–32)
Calcium: 8.7 mg/dL — ABNORMAL LOW (ref 8.9–10.3)
Chloride: 106 mmol/L (ref 98–111)
Creatinine, Ser: 0.85 mg/dL (ref 0.44–1.00)
GFR calc Af Amer: >60 mL/min (ref >60)
GFR calc non Af Amer: >60 mL/min (ref >60)
Glucose, Bld: 199 mg/dL — ABNORMAL HIGH (ref 70–99)
Potassium: 4.2 mmol/L (ref 3.5–5.1)
Sodium: 143 mmol/L (ref 135–145)
Total Bilirubin: <0.1 mg/dL — ABNORMAL LOW (ref 0.3–1.2)
Total Protein: 7.4 g/dL (ref 6.5–8.1)

## 2019-03-18 LAB — C-REACTIVE PROTEIN: CRP: 2.8 mg/dL — ABNORMAL HIGH (ref <1.0)

## 2019-03-18 LAB — D-DIMER, QUANTITATIVE: D-Dimer, Quant: 0.48 ug/mL-FEU (ref 0.00–0.50)

## 2019-03-18 MED ORDER — HYDROXYZINE HCL 25 MG PO TABS
50.0000 mg | ORAL_TABLET | Freq: Four times a day (QID) | ORAL | Status: AC | PRN
  Administered 2019-03-19: 50 mg via ORAL
  Filled 2019-03-18: qty 2

## 2019-03-18 MED ORDER — ENOXAPARIN SODIUM 80 MG/0.8ML ~~LOC~~ SOLN
0.5000 mg/kg | SUBCUTANEOUS | Status: DC
  Administered 2019-03-19: 75 mg via SUBCUTANEOUS
  Filled 2019-03-18: qty 0.8

## 2019-03-18 NOTE — Progress Notes (Signed)
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 Patient states that it happens often at home and that she suddenly becomes dizzy and weak. This evening around 1845, patient called and said she felt like her lymph nodes suddenly became swollen and that her throat felt full of mucous. Vitals stable. Patient states hot coffee usually helps when she has these symptoms. Hot coffee given and patient says she feels better. Will continue to monitor.

## 2019-03-18 NOTE — Plan of Care (Signed)

## 2019-03-18 NOTE — Progress Notes (Signed)
Nutrition Brief Note  Patient identified on the Malnutrition Screening Tool (MST) Report.  59 year old female with PMH of morbid obesity, asthma, depression, breast cancer s/p radiation and treatment who was recently exposed to COVID-19 through her daughter-in-law.  Spoke with pt via phone call to room. Pt denies any loss of taste or smell and reports her appetite is okay and improving daily. Pt states that she ate at least half of her lunch meal. Pt does report feeling weaker after she eats which she suspects may be related to her electromagnetic sensitivity.  Wt Readings from Last 15 Encounters:  03/18/19 (!) 148.6 kg  11/01/18 (!) 139.4 kg  10/18/18 136.1 kg  06/26/18 (!) 138.2 kg  05/22/18 135.9 kg  05/14/18 (!) 137 kg  05/10/18 (!) 138.8 kg  05/08/18 (!) 137.8 kg  03/28/18 (!) 140.3 kg  03/27/18 (!) 140.3 kg  03/21/18 (!) 139.6 kg  04/20/13 132.9 kg  11/23/12 133.7 kg  05/03/12 131.4 kg  04/30/12 131.7 kg    Body mass index is 59.92 kg/m. Patient meets criteria for obesity class III based on current BMI.   Current diet order is Regular. No meal completions charted at this time. Labs and medications reviewed.   No nutrition interventions warranted at this time. If nutrition issues arise, please consult RD.    Gaynell Face, MS, RD, LDN Inpatient Clinical Dietitian Pager: [phone] Weekend/After Hours: [phone]

## 2019-03-18 NOTE — Progress Notes (Signed)
PROGRESS NOTE    Kristina Rivera  [id number] DOB: [birth year] DOA: 03/16/2019 PCP: Jonathon Jordan, MD   Brief Narrative: 59 year old with past medical history significant for morbid obesity, asthma, depression breast cancer status post radiation and treatment who was recently exposed to Covid through her daughter-in-law.  Present with cold chills and shortness of breath.  Patient requiring 2 L of oxygen on admission.  Chest x-ray showed no evidence of active pneumonia.  Patient present with new oxygen requirement.    Assessment & Plan:   Principal Problem:   Acute respiratory failure with hypoxia (HCC) Active Problems:   Asthma   Malignant neoplasm of lower-outer quadrant of right breast of female, estrogen receptor negative ([place])   Morbid obesity with BMI of 45.0-49.9, adult (HCC)  1-Acute Hypoxic respiratory failure: Secondary to COVID-19 likely pneumonia -Although chest x-ray was negative for infiltrate, I will start Remdesivir, patient is symptomatic.  -Continue with Dexamethasone.  -Follow Blood cultures. 1of 4 positive for Staph coagulase negative. Likely contaminate. Will repeat  -continue with Vitamin C, D and Zinc.  -Chest x-ray no infiltrate, procalcitonin level less than 0.10.   discontinue IV antibiotics. -Oxygen requirement is stable. COVID-19 Labs  Recent Labs    03/16/19 1757 03/18/19 0757  DDIMER 0.65* 0.48  FERRITIN 250  --   LDH 356*  --   CRP 7.2* 2.8*    Lab Results  Component Value Date   SARSCOV2NAA POSITIVE (A) 03/16/2019   [place] NEGATIVE 10/15/2018   2-Asthma;  Continue with inhalers  Morbid obesity: Need counseling History of breast cancer need to follow-up with her oncologist.  Allergic reaction; mild episode.  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  Her dinner had mushroom, she put a pice in her mouth and realize it was mushroom and spit it out.  She develops burning sensation and swelling of her lips. She took a  benadryl 25 mg tablet from her purse.  I order IV benadryl 25 mg, Solumedrol 125 mg and pepcid. I came evaluated patient, she reported swelling resolved and denies any tingling.  As needed Benadryl.  No further episode.  1 out of 4 blood cultures growing staph coagulase-negative: Likely contaminant.  We will repeat blood cultures   Estimated body mass index is 59.92 kg/m as calculated from the following:   Height as of this encounter: 5\' 2"  (1.575 m).   Weight as of this encounter: 148.6 kg.   DVT prophylaxis: Lovenox Code Status: Full code  family Communication: Care discussed with patient Disposition Plan: Remain in the hospital for treatment of acute hypoxic respiratory failure secondary to COVID-19 viral illness Consultants:   None  Procedures:   None  Antimicrobials:    Subjective: She is feeling better, no fever swelling or tingling  in her lips  Objective: Vitals:   03/17/19 2200 03/18/19 0001 03/18/19 0521 03/18/19 1255  BP: 124/72 122/72 130/78 (!) 157/78  Pulse: (!) 58 66  62  Resp: 20 (!) 21  (!) 22  Temp:  98.2 F (36.8 C) 98 F (36.7 C) 98.7 F (37.1 C)  TempSrc:  Oral Oral Oral  SpO2: 95%   97%  Weight:  (!) 148.6 kg    Height:  5\' 2"  (1.575 m)     No intake or output data in the 24 hours ending 03/18/19 1442 Filed Weights   03/18/19 0001  Weight: (!) 148.6 kg    Examination:  General exam: NAD Respiratory system: CTA Cardiovascular system: S 1, S 2 RRR Gastrointestinal  system: BS present, soft, nt Central nervous system: non focal.  Extremities: symmetric power Skin: no rashes   Data Reviewed: I have personally reviewed following labs and imaging studies  CBC: Recent Labs  Lab 03/16/19 1411 03/16/19 2148 03/18/19 0757  WBC 5.6 4.8 13.1*  NEUTROABS 2.7  --   --   HGB 12.9 13.6 12.1  HCT 44.2 45.7 40.7  MCV 74.2* 73.5* 73.1*  PLT 262 258 [id number]   Basic Metabolic Panel: Recent Labs  Lab 03/16/19 1411 03/16/19 2148 03/18/19  0757  NA 142  --  143  K 4.0  --  4.2  CL 105  --  106  CO2 26  --  27  GLUCOSE 112*  --  199*  BUN 10  --  15  CREATININE 0.89 0.96 0.85  CALCIUM 8.9  --  8.7*   GFR: Estimated Creatinine Clearance: 100.7 mL/min (by C-G formula based on SCr of 0.85 mg/dL). Liver Function Tests: Recent Labs  Lab 03/16/19 1411 03/18/19 0757  AST 39 39  ALT 24 31  ALKPHOS 66 64  BILITOT 0.6 <0.1*  PROT 8.0 7.4  ALBUMIN 3.4* 3.1*   No results for input(s): LIPASE, AMYLASE in the last 168 hours. No results for input(s): AMMONIA in the last 168 hours. Coagulation Profile: No results for input(s): INR, PROTIME in the last 168 hours. Cardiac Enzymes: No results for input(s): CKTOTAL, CKMB, CKMBINDEX, TROPONINI in the last 168 hours. BNP (last 3 results) No results for input(s): PROBNP in the last 8760 hours. HbA1C: No results for input(s): HGBA1C in the last 72 hours. CBG: No results for input(s): GLUCAP in the last 168 hours. Lipid Profile: Recent Labs    03/16/19 1757 03/16/19 2148  TRIG 116 96   Thyroid Function Tests: No results for input(s): TSH, T4TOTAL, FREET4, T3FREE, THYROIDAB in the last 72 hours. Anemia Panel: Recent Labs    03/16/19 1757  FERRITIN 250   Sepsis Labs: Recent Labs  Lab 03/16/19 1757 03/16/19 1911  PROCALCITON <0.10  --   LATICACIDVEN 1.8 1.3    Recent Results (from the past 240 hour(s))  Blood Culture (routine x 2)     Status: None (Preliminary result)   Collection Time: 03/16/19  5:57 PM   Specimen: BLOOD RIGHT WRIST  Result Value Ref Range Status   Specimen Description BLOOD RIGHT WRIST  Final   Special Requests   Final    BOTTLES DRAWN AEROBIC AND ANAEROBIC Blood Culture results may not be optimal due to an inadequate volume of blood received in culture bottles   Culture   Final    NO GROWTH 2 DAYS Performed at [facility], [place] [address] [place], [place] [address]    Report Status PENDING  Incomplete  Blood Culture (routine x 2)      Status: Abnormal (Preliminary result)   Collection Time: 03/16/19  7:11 PM   Specimen: BLOOD  Result Value Ref Range Status   Specimen Description BLOOD LEFT HAND  Final   Special Requests   Final    BOTTLES DRAWN AEROBIC AND ANAEROBIC Blood Culture adequate volume   Culture  Setup Time   Final    GRAM POSITIVE COCCI IN BOTH AEROBIC AND ANAEROBIC BOTTLES CRITICAL RESULT CALLED TO, READ BACK BY AND VERIFIED WITH: A. MEYER, PHARMD AT [id number] ON 03/17/19 BY C. JESSUP, MT.    Culture (A)  Final    STAPHYLOCOCCUS SPECIES (COAGULASE NEGATIVE) THE SIGNIFICANCE OF ISOLATING THIS ORGANISM FROM A SINGLE SET  OF BLOOD CULTURES WHEN MULTIPLE SETS ARE DRAWN IS UNCERTAIN. PLEASE NOTIFY THE MICROBIOLOGY DEPARTMENT WITHIN ONE WEEK IF SPECIATION AND SENSITIVITIES ARE REQUIRED. Performed at [facility], [place] [address], [place], [place] [address]    Report Status PENDING  Incomplete  Respiratory Panel by RT PCR (Flu A&B, Covid) - Nasopharyngeal Swab     Status: Abnormal   Collection Time: 03/16/19  7:11 PM   Specimen: Nasopharyngeal Swab  Result Value Ref Range Status   SARS Coronavirus 2 by RT PCR POSITIVE (A) NEGATIVE Final    Comment: RESULT CALLED TO, READ BACK BY AND VERIFIED WITH: R. Beatris Si 2210 03/16/2019 T. TYSOR (NOTE) SARS-CoV-2 target nucleic acids are DETECTED. SARS-CoV-2 RNA is generally detectable in upper respiratory specimens  during the acute phase of infection. Positive results are indicative of the presence of the identified virus, but do not rule out bacterial infection or co-infection with other pathogens not detected by the test. Clinical correlation with patient history and other diagnostic information is necessary to determine patient infection status. The expected result is Negative. Fact Sheet for Patients:  PinkCheek.be Fact Sheet for Healthcare Providers: GravelBags.it This test is not yet approved or  cleared by the [place] FDA and  has been authorized for detection and/or diagnosis of SARS-CoV-2 by FDA under an Emergency Use Authorization (EUA).  This EUA will remain in effect (meaning this test can be used ) for the duration of  the COVID-19 declaration under Section 564(b)(1) of the Act, 21 U.S.C. section 360bbb-3(b)(1), unless the authorization is terminated or revoked sooner.    Influenza A by PCR NEGATIVE NEGATIVE Final   Influenza B by PCR NEGATIVE NEGATIVE Final    Comment: (NOTE) The Xpert Xpress SARS-CoV-2/FLU/RSV assay is intended as an aid in  the diagnosis of influenza from Nasopharyngeal swab specimens and  should not be used as a sole basis for treatment. Nasal washings and  aspirates are unacceptable for Xpert Xpress SARS-CoV-2/FLU/RSV  testing. Fact Sheet for Patients: PinkCheek.be Fact Sheet for Healthcare Providers: GravelBags.it This test is not yet approved or cleared by the [place] FDA and  has been authorized for detection and/or diagnosis of SARS-CoV-2 by  FDA under an Emergency Use Authorization (EUA). This EUA will remain  in effect (meaning this test can be used) for the duration of the  Covid-19 declaration under Section 564(b)(1) of the Act, 21  U.S.C. section 360bbb-3(b)(1), unless the authorization is  terminated or revoked. Performed at [facility], [place] [address] [place], [place] [address]          Radiology Studies: DG Chest Portable 1 View  Result Date: 03/16/2019 CLINICAL DATA:  Cough, fever, body aches EXAM: PORTABLE CHEST 1 VIEW COMPARISON:  05/14/2018 FINDINGS: Cardiomegaly. Both lungs are clear. The visualized skeletal structures are unremarkable. IMPRESSION: Cardiomegaly without acute abnormality of the lungs in AP portable projection. Electronically Signed   By: Eddie Candle M.D.   On: 03/16/2019 17:06        Scheduled Meds: . albuterol  2 puff  Inhalation Q6H  . vitamin C  500 mg Oral Daily  . cholecalciferol  1,000 Units Oral Daily  . dexamethasone (DECADRON) injection  10 mg Intravenous Daily  . [START ON 03/19/2019] enoxaparin (LOVENOX) injection  0.5 mg/kg Subcutaneous Q24H  . zinc sulfate  220 mg Oral Daily   Continuous Infusions: . famotidine (PEPCID) IV    . remdesivir 100 mg in NS 100 mL 100 mg (03/18/19 1002)  LOS: 2 days    Time spent: 35 minutes    Melysa Schroyer A Larnell Granlund, MD [facility]   If 7PM-7AM, please contact night-coverage [email or web address] Password [email or web address] 03/18/2019, 2:42 PM

## 2019-03-19 LAB — CBC
HCT: 39.3 % (ref 36.0–46.0)
Hemoglobin: 11.8 g/dL — ABNORMAL LOW (ref 12.0–15.0)
MCH: 21.9 pg — ABNORMAL LOW (ref 26.0–34.0)
MCHC: 30 g/dL (ref 30.0–36.0)
MCV: 72.8 fL — ABNORMAL LOW (ref 80.0–100.0)
Platelets: 301 K/uL (ref 150–400)
RBC: 5.4 MIL/uL — ABNORMAL HIGH (ref 3.87–5.11)
RDW: 15.5 % (ref 11.5–15.5)
WBC: 14.1 K/uL — ABNORMAL HIGH (ref 4.0–10.5)
nRBC: 0.1 % (ref 0.0–0.2)

## 2019-03-19 LAB — COMPREHENSIVE METABOLIC PANEL WITH GFR
ALT: 39 U/L (ref 0–44)
AST: 47 U/L — ABNORMAL HIGH (ref 15–41)
Albumin: 3 g/dL — ABNORMAL LOW (ref 3.5–5.0)
Alkaline Phosphatase: 59 U/L (ref 38–126)
Anion gap: 8 (ref 5–15)
BUN: 17 mg/dL (ref 6–20)
CO2: 27 mmol/L (ref 22–32)
Calcium: 8.5 mg/dL — ABNORMAL LOW (ref 8.9–10.3)
Chloride: 107 mmol/L (ref 98–111)
Creatinine, Ser: 0.8 mg/dL (ref 0.44–1.00)
GFR calc Af Amer: >60 mL/min
GFR calc non Af Amer: >60 mL/min
Glucose, Bld: 123 mg/dL — ABNORMAL HIGH (ref 70–99)
Potassium: 4.2 mmol/L (ref 3.5–5.1)
Sodium: 142 mmol/L (ref 135–145)
Total Bilirubin: 0.3 mg/dL (ref 0.3–1.2)
Total Protein: 6.8 g/dL (ref 6.5–8.1)

## 2019-03-19 LAB — CULTURE, BLOOD (ROUTINE X 2): Special Requests: ADEQUATE

## 2019-03-19 LAB — C-REACTIVE PROTEIN: CRP: 1 mg/dL — ABNORMAL HIGH

## 2019-03-19 LAB — D-DIMER, QUANTITATIVE: D-Dimer, Quant: 0.34 ug{FEU}/mL (ref 0.00–0.50)

## 2019-03-19 MED ORDER — ZINC SULFATE 220 (50 ZN) MG PO CAPS: 220.0000 mg | ORAL_CAPSULE | Freq: Every day | ORAL | 0 refills | Status: DC

## 2019-03-19 MED ORDER — DEXAMETHASONE 6 MG PO TABS: 6.0000 mg | ORAL_TABLET | Freq: Every day | ORAL | 0 refills | Status: DC

## 2019-03-19 NOTE — TOC Transition Note (Signed)
Transition of Care [facility]) - CM/SW Discharge Note   Patient Details  Name: Kristina Rivera MRN: [id number] Date of Birth: 08-18-1960  [facility] CM/SW Contact:  Maryclare Labrador, RN Phone Number: 03/19/2019, 1:53 PM   Clinical Narrative:  Pt will discharge home today.  Per pt she is independent from home and her husband can provide 24 supervision at discharge.  Pt has PCP and denied barriers with paying for discharge medicaitons.  CM provided DME and HH choice per medicare.gov HH - pt chose [facility] - both agencies accepted pt.  NO other CM needs determined at this time - discharge order signed - CM signing off    Final next level of care: [place] Barriers to Discharge: Barriers Resolved   Patient Goals and CMS Choice Patient states their goals for this hospitalization and ongoing recovery are:: Pt states she will like to get back to walking like she did before she got sick CMS Medicare.gov Compare Post Acute Care list provided to:: Patient Choice offered to / list presented to : Patient  Discharge Placement                       Discharge Plan and Services                DME Arranged: 3-N-1, [place] rolling DME Agency: [facility] Date DME Agency Contacted: 03/19/19 Time DME Agency Contacted: [id number] Representative spoke with at DME Agency: [place]: PT, OT [place] Agency: [place] Date [place]: 03/19/19 Time [place]: 1353 Representative spoke with at [place]: [place] ([place]) Interventions     Readmission Risk Interventions No flowsheet data found.

## 2019-03-19 NOTE — Evaluation (Signed)
Physical Therapy Evaluation Patient Details Name: Kristina Rivera MRN: [id number] DOB: 1960-03-29 Today's Date: 03/19/2019   History of Present Illness  59 yo female COVID + 12/20 admitted with cough fever and generalized body aches. pt with new oxygen demands. 1/3 allergic reaction to mushrooms on dinner tray  PMH anemia anxiety arthritis depression hx of breast CA hx of radiation disease sickle cell trait Obese  Clinical Impression  PTA pt living with husband and son in single story home with 3 small steps to enter. Pt was independent in mobility, cooking and cleaning. Pt currently limited in safe mobility by anxiety/dizziness with mobility in presence of decreased strength and endurance. Pt requires supervision for bed mobility and transfers and min guard for ambulation of 30 feet with RW. Pt with 1x bout of dizziness requiring standing rest break, pt cued for pursed lipped breathing and gaze visualization until dizziness subsided. Pt SaO2 dropped to 89%O2 on RA with ambulation but quickly rebounded to 96%O2 with pursed lipped breathing. Pt instructed in IS use to improve oxygenation and encouraged to use 10x/hr. PT recommending HHPT at discharge to improve strength and endurance. PT will continue to follow acutely.    Follow Up Recommendations Home health PT;Supervision for mobility/OOB    Equipment Recommendations  Rolling walker with 5" wheels;3in1 (PT)       Precautions / Restrictions Precautions Precautions: Fall Precaution Comments: near fall last night  Restrictions Weight Bearing Restrictions: No      Mobility  Bed Mobility Overal bed mobility: Needs Assistance Bed Mobility: Supine to Sit     Supine to sit: Supervision     General bed mobility comments: supervision for safety, use of bedrail and HoB elevated  Transfers Overall transfer level: Needs assistance Equipment used: Rolling walker (2 wheeled) Transfers: Sit to/from Stand Sit to Stand: Supervision          General transfer comment: supervision for safety, good powerup and steadying in standing  Ambulation/Gait Ambulation/Gait assistance: Min guard Gait Distance (Feet): 30 Feet Assistive device: Rolling walker (2 wheeled) Gait Pattern/deviations: Step-through pattern;Decreased step length - right;Decreased step length - left;Shuffle Gait velocity: slowed Gait velocity interpretation: <1.31 ft/sec, indicative of household ambulator General Gait Details: min guard for safety with slow, shuffling gait with mild instability, no overt LoB, 1x standing rest break for dizziness, vc for open eyes, gaze stabilization and pursed lipped breathing        Balance Overall balance assessment: Needs assistance Sitting-balance support: Feet supported;No upper extremity supported Sitting balance-Leahy Scale: Good     Standing balance support: No upper extremity supported;During functional activity Standing balance-Leahy Scale: Fair                               Pertinent Vitals/Pain Pain Assessment: No/denies pain    Home Living Family/patient expects to be discharged to:: Private residence Living Arrangements: Spouse/significant other Available Help at Discharge: Family;Available 24 hours/day Type of Home: House Home Access: Stairs to enter   [employer] of Steps: 3 Home Layout: One level Home Equipment: None      Prior Function Level of Independence: Independent                  Extremity/Trunk Assessment   Upper Extremity Assessment Upper Extremity Assessment: Defer to OT evaluation    Lower Extremity Assessment Lower Extremity Assessment: Generalized weakness       Communication   Communication: No difficulties  Cognition Arousal/Alertness:  Awake/alert Behavior During Therapy: WFL for tasks assessed/performed Overall Cognitive Status: Within Functional Limits for tasks assessed                                        General  Comments General comments (skin integrity, edema, etc.): SaO2 with ambulation dropped to 89%O2, quickly rebounded to 96%O2 with pursed lipped breathing, instructed pt in incentive spirometer use     Exercises Other Exercises Other Exercises: incentive spirometer x10 goal 1500 mL   Assessment/Plan    PT Assessment Patient needs continued PT services  PT Problem List Decreased strength;Decreased activity tolerance;Decreased balance;Decreased mobility;Cardiopulmonary status limiting activity       PT Treatment Interventions DME instruction;Gait training;Stair training;Functional mobility training;Therapeutic activities;Therapeutic exercise;Balance training;Cognitive remediation;Patient/family education    PT Goals (Current goals can be found in the Care Plan section)  Acute Rehab PT Goals Patient Stated Goal: go home to avoid electromagnetic fields PT Goal Formulation: With patient Time For Goal Achievement: 04/02/19 Potential to Achieve Goals: Good    Frequency Min 3X/week    AM-PAC PT "6 Clicks" Mobility  Outcome Measure Help needed turning from your back to your side while in a flat bed without using bedrails?: None Help needed moving from lying on your back to sitting on the side of a flat bed without using bedrails?: A Little Help needed moving to and from a bed to a chair (including a wheelchair)?: None Help needed standing up from a chair using your arms (e.g., wheelchair or bedside chair)?: None Help needed to walk in hospital room?: A Little Help needed climbing 3-5 steps with a railing? : A Lot 6 Click Score: 20    End of Session Equipment Utilized During Treatment: Gait belt Activity Tolerance: Patient tolerated treatment well Patient left: in chair;with call bell/phone within reach Nurse Communication: Mobility status;Other (comment)(oxygen saturation with ambulation) PT Visit Diagnosis: Unsteadiness on feet (R26.81);Muscle weakness (generalized) (M62.81)    Time:  [id number] PT Time Calculation (min) (ACUTE ONLY): 44 min   Charges:   PT Evaluation $PT Eval Moderate Complexity: 1 Mod PT Treatments $Gait Training: 8-22 mins $Therapeutic Exercise: 8-22 mins        Selden Noteboom B. Migdalia Dk PT, DPT Acute Rehabilitation Services Pager [phone] Office [phone]   [place] 03/19/2019, 11:27 AM

## 2019-03-19 NOTE — Discharge Summary (Signed)
Physician Discharge Summary  Kristina Rivera [id number] DOB: 03-27-1960 DOA: 03/16/2019  PCP: Jonathon Jordan, MD  Admit date: 03/16/2019 Discharge date: 03/19/2019  Admitted From: Home  Disposition:  Home   Recommendations for Outpatient Follow-up:  1. Follow up with PCP in 1-2 weeks 2. Please obtain BMP/CBC in one week 3. Please follow up on the following pending results: final results of Blood cultures.   Home Health: yes  Discharge Condition: Stable.  CODE STATUS: Full code.  Diet recommendation: Heart Healthy   Brief/Interim Summary: 59 year old with past medical history significant for morbid obesity, asthma, depression breast cancer status post radiation and treatment who was recently exposed to Covid through her daughter-in-law.  Present with cold chills and shortness of breath.  Patient requiring 2 L of oxygen on admission.  Chest x-ray showed no evidence of active pneumonia.  Patient present with new oxygen requirement.  -Acute Hypoxic respiratory failure: Secondary to COVID-19 likely pneumonia -Although chest x-ray was negative for infiltrate, I will start Remdesivir, patient is symptomatic.  -Continue with Dexamethasone. Will provide 6 more day to complete 10 days course.  -Follow Blood cultures. 1of 4 positive for Staph coagulase negative. Likely contaminate. Will repeat  -continue with Vitamin C, D and Zinc.  -Chest x-ray no infiltrate, procalcitonin level less than 0.10.   discontinue IV antibiotics. -no oxygen requirement. She is willing to complete treatment at infusion clinic.  She will need 2 more days. Appointment arrange.   2-Asthma;  Continue with inhalers  Morbid obesity: Need counseling History of breast cancer need to follow-up with her oncologist.  Allergic reaction; mild episode.  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  Her dinner had mushroom, she put a pice in her mouth and realize it was mushroom and spit it out.  She develops burning  sensation and swelling of her lips. She took a benadryl 25 mg tablet from her purse.  I order IV benadryl 25 mg, Solumedrol 125 mg and pepcid. I came evaluated patient, she reported swelling resolved and denies any tingling.  As needed Benadryl.  No further episode.  1 out of 4 blood cultures growing staph coagulase-negative: Likely contaminant. Repeated  blood cultures negative.    Discharge Diagnoses:  Principal Problem:   Acute respiratory failure with hypoxia ([place]) Active Problems:   Asthma   Malignant neoplasm of lower-outer quadrant of right breast of female, estrogen receptor negative ([place])   Morbid obesity with BMI of 45.0-49.9, adult [facility])    Discharge Instructions  Discharge Instructions    Diet - low sodium heart healthy   Complete by: As directed    Increase activity slowly   Complete by: As directed      Allergies as of 03/19/2019      Reactions   Aspirin Anaphylaxis, Hives   Nsaids Anaphylaxis   Other Anaphylaxis   MUSHROOMS      Medication List    TAKE these medications   acetaminophen 650 MG CR tablet Commonly known as: TYLENOL Take 650 mg by mouth every 8 (eight) hours as needed for pain.   albuterol 108 (90 Base) MCG/ACT inhaler Commonly known as: Ventolin HFA Inhale 2 puffs into the lungs as needed.   cholecalciferol 25 MCG (1000 UT) tablet Commonly known as: VITAMIN D3 Take 1 tablet (1,000 Units total) by mouth daily.   dexamethasone 6 MG tablet Commonly known as: DECADRON Take 1 tablet (6 mg total) by mouth daily.   FISH OIL ADULT GUMMIES PO Take 1 tablet by mouth daily.  vitamin C 500 MG tablet Commonly known as: ASCORBIC ACID Take 500 mg by mouth daily.   zinc sulfate 220 (50 Zn) MG capsule Take 1 capsule (220 mg total) by mouth daily. Start taking on: March 20, 2019            Durable Medical Equipment  (From admission, onward)         Start     Ordered   03/19/19 1220  For home use only DME Walker rolling  Once     Comments: 5'' wheels  Question:  Patient needs a walker to treat with the following condition  Answer:  Balance disorder   03/19/19 1219   03/19/19 1218  For home use only DME 3 n 1  Once     03/19/19 1218          Allergies  Allergen Reactions  . Aspirin Anaphylaxis and Hives  . Nsaids Anaphylaxis  . Other Anaphylaxis    MUSHROOMS    Consultations:  none   Procedures/Studies: DG Chest Portable 1 View  Result Date: 03/16/2019 CLINICAL DATA:  Cough, fever, body aches EXAM: PORTABLE CHEST 1 VIEW COMPARISON:  05/14/2018 FINDINGS: Cardiomegaly. Both lungs are clear. The visualized skeletal structures are unremarkable. IMPRESSION: Cardiomegaly without acute abnormality of the lungs in AP portable projection. Electronically Signed   By: Eddie Candle M.D.   On: 03/16/2019 17:06     Subjective: She is breathing better.  She gets dizzy at time due to EM energy.  She feels she should be able to complete Remdesivir treatment out patient. Her husband can drive her and she has wheelchair.   Discharge Exam: Vitals:   03/19/19 0600 03/19/19 0700  BP:  135/69  Pulse: (!) 47 (!) 52  Resp: 15 17  Temp:  97.9 F (36.6 C)  SpO2: 93% 92%     General: Pt is alert, awake, not in acute distress Cardiovascular: RRR, S1/S2 +, no rubs, no gallops Respiratory: CTA bilaterally, no wheezing, no rhonchi Abdominal: Soft, NT, ND, bowel sounds + Extremities: no edema, no cyanosis    The results of significant diagnostics from this hospitalization (including imaging, microbiology, ancillary and laboratory) are listed below for reference.     Microbiology: Recent Results (from the past 240 hour(s))  Blood Culture (routine x 2)     Status: None (Preliminary result)   Collection Time: 03/16/19  5:57 PM   Specimen: BLOOD RIGHT WRIST  Result Value Ref Range Status   Specimen Description BLOOD RIGHT WRIST  Final   Special Requests   Final    BOTTLES DRAWN AEROBIC AND ANAEROBIC Blood Culture  results may not be optimal due to an inadequate volume of blood received in culture bottles   Culture   Final    NO GROWTH 3 DAYS Performed at [facility], [place] [address] [place], [place] [address]    Report Status PENDING  Incomplete  Blood Culture (routine x 2)     Status: Abnormal   Collection Time: 03/16/19  7:11 PM   Specimen: BLOOD  Result Value Ref Range Status   Specimen Description BLOOD LEFT HAND  Final   Special Requests   Final    BOTTLES DRAWN AEROBIC AND ANAEROBIC Blood Culture adequate volume   Culture  Setup Time   Final    GRAM POSITIVE COCCI IN BOTH AEROBIC AND ANAEROBIC BOTTLES CRITICAL RESULT CALLED TO, READ BACK BY AND VERIFIED WITH: A. MEYER, PHARMD AT [id number] ON 03/17/19 BY C. JESSUP, MT.  Culture (A)  Final    STAPHYLOCOCCUS SPECIES (COAGULASE NEGATIVE) THE SIGNIFICANCE OF ISOLATING THIS ORGANISM FROM A SINGLE SET OF BLOOD CULTURES WHEN MULTIPLE SETS ARE DRAWN IS UNCERTAIN. PLEASE NOTIFY THE MICROBIOLOGY DEPARTMENT WITHIN ONE WEEK IF SPECIATION AND SENSITIVITIES ARE REQUIRED. Performed at [facility], [place] [address], [place], [place] [address]    Report Status 03/19/2019 FINAL  Final  Respiratory Panel by RT PCR (Flu A&B, Covid) - Nasopharyngeal Swab     Status: Abnormal   Collection Time: 03/16/19  7:11 PM   Specimen: Nasopharyngeal Swab  Result Value Ref Range Status   SARS Coronavirus 2 by RT PCR POSITIVE (A) NEGATIVE Final    Comment: RESULT CALLED TO, READ BACK BY AND VERIFIED WITH: RBeatris Si 2210 03/16/2019 T. TYSOR (NOTE) SARS-CoV-2 target nucleic acids are DETECTED. SARS-CoV-2 RNA is generally detectable in upper respiratory specimens  during the acute phase of infection. Positive results are indicative of the presence of the identified virus, but do not rule out bacterial infection or co-infection with other pathogens not detected by the test. Clinical correlation with patient history and other diagnostic information is necessary  to determine patient infection status. The expected result is Negative. Fact Sheet for Patients:  PinkCheek.be Fact Sheet for Healthcare Providers: GravelBags.it This test is not yet approved or cleared by the [place] FDA and  has been authorized for detection and/or diagnosis of SARS-CoV-2 by FDA under an Emergency Use Authorization (EUA).  This EUA will remain in effect (meaning this test can be used ) for the duration of  the COVID-19 declaration under Section 564(b)(1) of the Act, 21 U.S.C. section 360bbb-3(b)(1), unless the authorization is terminated or revoked sooner.    Influenza A by PCR NEGATIVE NEGATIVE Final   Influenza B by PCR NEGATIVE NEGATIVE Final    Comment: (NOTE) The Xpert Xpress SARS-CoV-2/FLU/RSV assay is intended as an aid in  the diagnosis of influenza from Nasopharyngeal swab specimens and  should not be used as a sole basis for treatment. Nasal washings and  aspirates are unacceptable for Xpert Xpress SARS-CoV-2/FLU/RSV  testing. Fact Sheet for Patients: PinkCheek.be Fact Sheet for Healthcare Providers: GravelBags.it This test is not yet approved or cleared by the [place] FDA and  has been authorized for detection and/or diagnosis of SARS-CoV-2 by  FDA under an Emergency Use Authorization (EUA). This EUA will remain  in effect (meaning this test can be used) for the duration of the  Covid-19 declaration under Section 564(b)(1) of the Act, 21  U.S.C. section 360bbb-3(b)(1), unless the authorization is  terminated or revoked. Performed at [facility], [place] [address] [place], [place] [address]   Culture, blood (routine x 2)     Status: None (Preliminary result)   Collection Time: 03/18/19  3:40 PM   Specimen: BLOOD  Result Value Ref Range Status   Specimen Description BLOOD RIGHT ANTECUBITAL  Final   Special Requests    Final    BOTTLES DRAWN AEROBIC ONLY Blood Culture results may not be optimal due to an inadequate volume of blood received in culture bottles   Culture   Final    NO GROWTH < 24 HOURS Performed at [facility], [place] [address] [place], [place] [address]    Report Status PENDING  Incomplete  Culture, blood (routine x 2)     Status: None (Preliminary result)   Collection Time: 03/18/19  4:10 PM   Specimen: BLOOD  Result Value Ref Range Status  Specimen Description BLOOD LEFT ANTECUBITAL  Final   Special Requests   Final    BOTTLES DRAWN AEROBIC AND ANAEROBIC Blood Culture adequate volume   Culture   Final    NO GROWTH < 24 HOURS Performed at [facility], [address], [place], [place] [address]    Report Status PENDING  Incomplete     Labs: BNP (last 3 results) No results for input(s): BNP in the last 8760 hours. Basic Metabolic Panel: Recent Labs  Lab 03/16/19 1411 03/16/19 2148 03/18/19 0757 03/19/19 0500  NA 142  --  143 142  K 4.0  --  4.2 4.2  CL 105  --  106 107  CO2 26  --  27 27  GLUCOSE 112*  --  199* 123*  BUN 10  --  15 17  CREATININE 0.89 0.96 0.85 0.80  CALCIUM 8.9  --  8.7* 8.5*   Liver Function Tests: Recent Labs  Lab 03/16/19 1411 03/18/19 0757 03/19/19 0500  AST 39 39 47*  ALT 24 31 39  ALKPHOS 66 64 59  BILITOT 0.6 <0.1* 0.3  PROT 8.0 7.4 6.8  ALBUMIN 3.4* 3.1* 3.0*   No results for input(s): LIPASE, AMYLASE in the last 168 hours. No results for input(s): AMMONIA in the last 168 hours. CBC: Recent Labs  Lab 03/16/19 1411 03/16/19 2148 03/18/19 0757 03/19/19 0500  WBC 5.6 4.8 13.1* 14.1*  NEUTROABS 2.7  --   --   --   HGB 12.9 13.6 12.1 11.8*  HCT 44.2 45.7 40.7 39.3  MCV 74.2* 73.5* 73.1* 72.8*  PLT 262 258 323 301   Cardiac Enzymes: No results for input(s): CKTOTAL, CKMB, CKMBINDEX, TROPONINI in the last 168 hours. BNP: Invalid input(s): POCBNP CBG: No results for input(s): GLUCAP in the last 168  hours. D-Dimer Recent Labs    03/18/19 0757 03/19/19 0500  DDIMER 0.48 0.34   Hgb A1c No results for input(s): HGBA1C in the last 72 hours. Lipid Profile Recent Labs    03/16/19 1757 03/16/19 2148  TRIG 116 96   Thyroid function studies No results for input(s): TSH, T4TOTAL, T3FREE, THYROIDAB in the last 72 hours.  Invalid input(s): FREET3 Anemia work up Recent Labs    03/16/19 1757  FERRITIN 250   Urinalysis    Component Value Date/Time   COLORURINE AMBER (A) 03/16/2019 1741   APPEARANCEUR HAZY (A) 03/16/2019 1741   LABSPEC 1.025 03/16/2019 1741   PHURINE 5.0 03/16/2019 1741   GLUCOSEU NEGATIVE 03/16/2019 1741   HGBUR NEGATIVE 03/16/2019 1741   BILIRUBINUR NEGATIVE 03/16/2019 1741   KETONESUR NEGATIVE 03/16/2019 1741   PROTEINUR 30 (A) 03/16/2019 1741   NITRITE NEGATIVE 03/16/2019 1741   LEUKOCYTESUR NEGATIVE 03/16/2019 1741   Sepsis Labs Invalid input(s): PROCALCITONIN,  WBC,  LACTICIDVEN Microbiology Recent Results (from the past 240 hour(s))  Blood Culture (routine x 2)     Status: None (Preliminary result)   Collection Time: 03/16/19  5:57 PM   Specimen: BLOOD RIGHT WRIST  Result Value Ref Range Status   Specimen Description BLOOD RIGHT WRIST  Final   Special Requests   Final    BOTTLES DRAWN AEROBIC AND ANAEROBIC Blood Culture results may not be optimal due to an inadequate volume of blood received in culture bottles   Culture   Final    NO GROWTH 3 DAYS Performed at [facility], [place] [address], [place], [place] [address]    Report Status PENDING  Incomplete  Blood  Culture (routine x 2)     Status: Abnormal   Collection Time: 03/16/19  7:11 PM   Specimen: BLOOD  Result Value Ref Range Status   Specimen Description BLOOD LEFT HAND  Final   Special Requests   Final    BOTTLES DRAWN AEROBIC AND ANAEROBIC Blood Culture adequate volume   Culture  Setup Time   Final    GRAM POSITIVE COCCI IN BOTH AEROBIC AND ANAEROBIC BOTTLES CRITICAL RESULT  CALLED TO, READ BACK BY AND VERIFIED WITH: A. MEYER, PHARMD AT [id number] ON 03/17/19 BY C. JESSUP, MT.    Culture (A)  Final    STAPHYLOCOCCUS SPECIES (COAGULASE NEGATIVE) THE SIGNIFICANCE OF ISOLATING THIS ORGANISM FROM A SINGLE SET OF BLOOD CULTURES WHEN MULTIPLE SETS ARE DRAWN IS UNCERTAIN. PLEASE NOTIFY THE MICROBIOLOGY DEPARTMENT WITHIN ONE WEEK IF SPECIATION AND SENSITIVITIES ARE REQUIRED. Performed at [facility], [place] [address], [place], [place] [address]    Report Status 03/19/2019 FINAL  Final  Respiratory Panel by RT PCR (Flu A&B, Covid) - Nasopharyngeal Swab     Status: Abnormal   Collection Time: 03/16/19  7:11 PM   Specimen: Nasopharyngeal Swab  Result Value Ref Range Status   SARS Coronavirus 2 by RT PCR POSITIVE (A) NEGATIVE Final    Comment: RESULT CALLED TO, READ BACK BY AND VERIFIED WITH: RBeatris Si 2210 03/16/2019 T. TYSOR (NOTE) SARS-CoV-2 target nucleic acids are DETECTED. SARS-CoV-2 RNA is generally detectable in upper respiratory specimens  during the acute phase of infection. Positive results are indicative of the presence of the identified virus, but do not rule out bacterial infection or co-infection with other pathogens not detected by the test. Clinical correlation with patient history and other diagnostic information is necessary to determine patient infection status. The expected result is Negative. Fact Sheet for Patients:  PinkCheek.be Fact Sheet for Healthcare Providers: GravelBags.it This test is not yet approved or cleared by the [place] FDA and  has been authorized for detection and/or diagnosis of SARS-CoV-2 by FDA under an Emergency Use Authorization (EUA).  This EUA will remain in effect (meaning this test can be used ) for the duration of  the COVID-19 declaration under Section 564(b)(1) of the Act, 21 U.S.C. section 360bbb-3(b)(1), unless the authorization is terminated or  revoked sooner.    Influenza A by PCR NEGATIVE NEGATIVE Final   Influenza B by PCR NEGATIVE NEGATIVE Final    Comment: (NOTE) The Xpert Xpress SARS-CoV-2/FLU/RSV assay is intended as an aid in  the diagnosis of influenza from Nasopharyngeal swab specimens and  should not be used as a sole basis for treatment. Nasal washings and  aspirates are unacceptable for Xpert Xpress SARS-CoV-2/FLU/RSV  testing. Fact Sheet for Patients: PinkCheek.be Fact Sheet for Healthcare Providers: GravelBags.it This test is not yet approved or cleared by the [place] FDA and  has been authorized for detection and/or diagnosis of SARS-CoV-2 by  FDA under an Emergency Use Authorization (EUA). This EUA will remain  in effect (meaning this test can be used) for the duration of the  Covid-19 declaration under Section 564(b)(1) of the Act, 21  U.S.C. section 360bbb-3(b)(1), unless the authorization is  terminated or revoked. Performed at [facility], [place] [address], [place], [place] [address]   Culture, blood (routine x 2)     Status: None (Preliminary result)   Collection Time: 03/18/19  3:40 PM   Specimen: BLOOD  Result Value Ref Range Status   Specimen Description BLOOD RIGHT ANTECUBITAL  Final   Special Requests   Final    BOTTLES DRAWN AEROBIC ONLY Blood Culture results may not be optimal due to an inadequate volume of blood received in culture bottles   Culture   Final    NO GROWTH < 24 HOURS Performed at [place] [address] [place], [place] [address]    Report Status PENDING  Incomplete  Culture, blood (routine x 2)     Status: None (Preliminary result)   Collection Time: 03/18/19  4:10 PM   Specimen: BLOOD  Result Value Ref Range Status   Specimen Description BLOOD LEFT ANTECUBITAL  Final   Special Requests   Final    BOTTLES DRAWN AEROBIC AND ANAEROBIC Blood Culture adequate volume   Culture   Final    NO  GROWTH < 24 HOURS Performed at [facility], [place] [address], [place], [place] [address]    Report Status PENDING  Incomplete     Time coordinating discharge: 40 minutes  SIGNED:   Elmarie Shiley, MD  [facility]

## 2019-03-19 NOTE — Evaluation (Signed)
Occupational Therapy Evaluation Patient Details Name: Kristina Rivera MRN: [id number] DOB: 01/30/1961 Today's Date: 03/19/2019    History of Present Illness 59 yo female COVID + 12/20 admitted with cough fever and generalized body aches. pt with new oxygen demands. 1/3 allergic reaction to mushrooms on dinner tray  PMH anemia anxiety arthritis depression hx of breast CA hx of radiation disease sickle cell trait Obese   Clinical Impression   This 59 y/o female presents with the above. PTA pt living with spouse and son, reports independent with mobility and intermittent assist from spouse for ADL/iADL tasks PRN. Pt demonstrating functional transfers at supervision-minguard assist level using RW today. Pt with onset of wave of dizziness/lightheadedness, pt reports due to electromagnetic sensitivity therefore mobility limited to sit<>stand from recliner only. She currently requires up to [facility] for LB ADL (reports partly due to baseline chronic back issues). SpO2 >90% on RA with activity. Pt reports has good support from spouse who is able to provide close to 24hr supervision/assist PRN after discharge. She will benefit from continued OT services while in acute setting however do not anticipate pt will require follow up OT services. Will follow.     Follow Up Recommendations  No OT follow up;Supervision/Assistance - 24 hour    Equipment Recommendations  3 in 1 bedside commode           Precautions / Restrictions Precautions Precautions: Fall Precaution Comments: near fall last night  Restrictions Weight Bearing Restrictions: No      Mobility Bed Mobility Overal bed mobility: Needs Assistance Bed Mobility: Supine to Sit     Supine to sit: Supervision     General bed mobility comments: received OOB in recliner  Transfers Overall transfer level: Needs assistance Equipment used: Rolling walker (2 wheeled) Transfers: Sit to/from Stand Sit to Stand: Supervision;Min guard          General transfer comment: supervision for safety, good powerup and steadying in standing    Balance Overall balance assessment: Needs assistance Sitting-balance support: Feet supported;No upper extremity supported Sitting balance-Leahy Scale: Good     Standing balance support: No upper extremity supported;During functional activity Standing balance-Leahy Scale: Fair                             ADL either performed or assessed with clinical judgement   ADL Overall ADL's : Needs assistance/impaired Eating/Feeding: Independent;Sitting   Grooming: Set up;Sitting   Upper Body Bathing: Supervision/ safety;Sitting   Lower Body Bathing: Minimal assistance;Moderate assistance;Sit to/from stand   Upper Body Dressing : Set up;Sitting   Lower Body Dressing: Moderate assistance;Sit to/from stand Lower Body Dressing Details (indicate cue type and reason): minguard for standing balance, pt reports difficulty at baseline with reaching towards feet     Toileting- Clothing Manipulation and Hygiene: Moderate assistance;Sit to/from Nurse, children's Details (indicate cue type and reason): discussed use of 3:1 in shower for seat for increased safety given episodes of lightheadedness  Functional mobility during ADLs: Min guard;Rolling walker(sit<>Stand at RW) General ADL Comments: pt with baseline dizziness intermittently and with onset of "wave" from electromagnetic sensitivity during session, VSS     Vision         Perception     Praxis      Pertinent Vitals/Pain Pain Assessment: No/denies pain     Hand Dominance     Extremity/Trunk Assessment Upper Extremity Assessment Upper Extremity Assessment: Overall WFL for tasks  assessed   Lower Extremity Assessment Lower Extremity Assessment: Defer to PT evaluation       Communication Communication Communication: No difficulties   Cognition Arousal/Alertness: Awake/alert Behavior During Therapy: WFL for  tasks assessed/performed Overall Cognitive Status: Within Functional Limits for tasks assessed                                     General Comments  SaO2 with ambulation dropped to 89%O2, quickly rebounded to 96%O2 with pursed lipped breathing, instructed pt in incentive spirometer use     Exercises Exercises: Other exercises Other Exercises Other Exercises: incentive spirometer x10 goal 1500 mL   Shoulder Instructions      Home Living Family/patient expects to be discharged to:: Private residence Living Arrangements: Spouse/significant other Available Help at Discharge: Family;Available 24 hours/day Type of Home: House Home Access: Stairs to enter [employer] of Steps: 3   Home Layout: One level     Bathroom Shower/Tub: Teacher, early years/pre: Standard Bathroom Accessibility: Yes   Home Equipment: Hand held shower head          Prior Functioning/Environment Level of Independence: Independent                 OT Problem List: Decreased strength;Decreased activity tolerance;Impaired balance (sitting and/or standing);Cardiopulmonary status limiting activity;Obesity;Decreased knowledge of use of DME or AE      OT Treatment/Interventions: Self-care/ADL training;Therapeutic exercise;Energy conservation;DME and/or AE instruction;Therapeutic activities;Patient/family education;Balance training    OT Goals(Current goals can be found in the care plan section) Acute Rehab OT Goals Patient Stated Goal: go home to avoid electromagnetic fields OT Goal Formulation: With patient Time For Goal Achievement: 04/02/19 Potential to Achieve Goals: Good  OT Frequency: Min 2X/week   Barriers to D/C:            Co-evaluation              AM-PAC OT "6 Clicks" Daily Activity     Outcome Measure Help from another person eating meals?: None Help from another person taking care of personal grooming?: A Little Help from another person  toileting, which includes using toliet, bedpan, or urinal?: A Lot Help from another person bathing (including washing, rinsing, drying)?: A Lot Help from another person to put on and taking off regular upper body clothing?: None Help from another person to put on and taking off regular lower body clothing?: A Lot 6 Click Score: 17   End of Session Nurse Communication: Mobility status  Activity Tolerance: Patient tolerated treatment well;Other (comment)(limited due to dizziness/wave from electromagnetic sensitivi) Patient left: in chair;with call bell/phone within reach  OT Visit Diagnosis: Other abnormalities of gait and mobility (R26.89);Dizziness and giddiness (R42)                Time: [id number] OT Time Calculation (min): 12 min Charges:  OT General Charges $OT Visit: 1 Visit OT Evaluation $OT Eval Moderate Complexity: 1 Mod  Lou Cal, OT [employer] Pager [phone] Office [phone]   Raymondo Band 03/19/2019, 12:11 PM

## 2019-03-19 NOTE — Care Management Important Message (Signed)
Important Message  Patient Details  Name: Kristina Rivera MRN: [id number] Date of Birth: [birth year]   Medicare Important Message Given:  Yes - Important Message mailed due to current National Emergency  Verbal consent obtained due to current National Emergency  Relationship to patient: Self Contact Name: Alba Twedt Call Date: 03/19/19  Time: 1404 Phone: [phone] Outcome: Spoke with contact Important Message mailed to: Patient address on file    Delorse Lek 03/19/2019, 2:04 PM

## 2019-03-19 NOTE — Progress Notes (Addendum)
Kristina Rivera to be D/C'd home per MD order. Discussed with the patient and all questions fully answered. VVS, Skin clean, dry and intact without evidence of skin break down, no evidence of skin tears noted.  IV catheter discontinued intact. Site without signs and symptoms of complications. Dressing and pressure applied.  An After Visit Summary was printed and given to the patient.  Patient escorted via [place], and D/C home via private auto. Walker and BSC delivered to room. [name]  03/19/2019 4:12 PM

## 2019-03-19 NOTE — Progress Notes (Signed)
Patient did not tolerate transfer well. Patient was sitting in recliner, wanting to ambulate to bathroom. This RN was standing in front of patient as she stood. Patient leaned forward with head on RN's chest. Patient was also incontinent of urine while standing, stating that she could not hold it. Patient assisted to turn and sit on edge of bed then to lay in bed. Patient encouraged to remain in bed, and toileting and bathing can occur while patient is in bed. Patient c/o electromagnetic sensitivity, stating this weakness occurs in waves. During shift report, patient was very anxious and tearful speaking about this disorder. NP was notified and stated Benadryl already ordered could be given for sleep. Patient states that Benadryl causes her leg cramps. NP notified of this. Will continue to monitor.

## 2019-03-19 NOTE — Progress Notes (Signed)
Kristina Salina, NP ordered Vistaril PO. This RN offered to patient earlier, but patient didn't think she needed med since she was falling asleep. Patient now called for medication. Vistaril PO given as ordered. Will continue to monitor.

## 2019-03-19 NOTE — Discharge Instructions (Signed)
You are scheduled for an outpatient infusion of Remdesivir at 530PM on Wednesday 1/6 and Thursday 1/7.  Please report to [facility] at [address].  Drive to the security guard and tell them you are here for an infusion. They will direct you to the front entrance where we will come and get you.  For questions call [phone].  Thanks   Please quarantine until 04-05-2019.   COVID-19 COVID-19 is a respiratory infection that is caused by a virus called severe acute respiratory syndrome coronavirus 2 (SARS-CoV-2). The disease is also known as coronavirus disease or novel coronavirus. In some people, the virus may not cause any symptoms. In others, it may cause a serious infection. The infection can get worse quickly and can lead to complications, such as:  Pneumonia, or infection of the lungs.  Acute respiratory distress syndrome or ARDS. This is a condition in which fluid build-up in the lungs prevents the lungs from filling with air and passing oxygen into the blood.  Acute respiratory failure. This is a condition in which there is not enough oxygen passing from the lungs to the body or when carbon dioxide is not passing from the lungs out of the body.  Sepsis or septic shock. This is a serious bodily reaction to an infection.  Blood clotting problems.  Secondary infections due to bacteria or fungus.  Organ failure. This is when your body's organs stop working. The virus that causes COVID-19 is contagious. This means that it can spread from person to person through droplets from coughs and sneezes (respiratory secretions). What are the causes? This illness is caused by a virus. You may catch the virus by:  Breathing in droplets from an infected person. Droplets can be spread by a person breathing, speaking, singing, coughing, or sneezing.  Touching something, like a table or a doorknob, that was exposed to the virus (contaminated) and then touching your mouth, nose, or  eyes. What increases the risk? Risk for infection You are more likely to be infected with this virus if you:  Are within 6 feet (2 meters) of a person with COVID-19.  Provide care for or live with a person who is infected with COVID-19.  Spend time in crowded indoor spaces or live in shared housing. Risk for serious illness You are more likely to become seriously ill from the virus if you:  Are 52 years of age or older. The higher your age, the more you are at risk for serious illness.  Live in a nursing home or long-term care facility.  Have cancer.  Have a long-term (chronic) disease such as: ? Chronic lung disease, including chronic obstructive pulmonary disease or asthma. ? A long-term disease that lowers your body's ability to fight infection (immunocompromised). ? Heart disease, including heart failure, a condition in which the arteries that lead to the heart become narrow or blocked (coronary artery disease), a disease which makes the heart muscle thick, weak, or stiff (cardiomyopathy). ? Diabetes. ? Chronic kidney disease. ? Sickle cell disease, a condition in which red blood cells have an abnormal "sickle" shape. ? Liver disease.  Are obese. What are the signs or symptoms? Symptoms of this condition can range from mild to severe. Symptoms may appear any time from 2 to 14 days after being exposed to the virus. They include:  A fever or chills.  A cough.  Difficulty breathing.  Headaches, body aches, or muscle aches.  Runny or stuffy (congested) nose.  A sore  throat.  New loss of taste or smell. Some people may also have stomach problems, such as nausea, vomiting, or diarrhea. Other people may not have any symptoms of COVID-19. How is this diagnosed? This condition may be diagnosed based on:  Your signs and symptoms, especially if: ? You live in an area with a COVID-19 outbreak. ? You recently traveled to or from an area where the virus is common. ? You  provide care for or live with a person who was diagnosed with COVID-19. ? You were exposed to a person who was diagnosed with COVID-19.  A physical exam.  Lab tests, which may include: ? Taking a sample of fluid from the back of your nose and throat (nasopharyngeal fluid), your nose, or your throat using a swab. ? A sample of mucus from your lungs (sputum). ? Blood tests.  Imaging tests, which may include, X-rays, CT scan, or ultrasound. How is this treated? At present, there is no medicine to treat COVID-19. Medicines that treat other diseases are being used on a trial basis to see if they are effective against COVID-19. Your health care provider will talk with you about ways to treat your symptoms. For most people, the infection is mild and can be managed at home with rest, fluids, and over-the-counter medicines. Treatment for a serious infection usually takes places in a hospital intensive care unit (ICU). It may include one or more of the following treatments. These treatments are given until your symptoms improve.  Receiving fluids and medicines through an IV.  Supplemental oxygen. Extra oxygen is given through a tube in the nose, a face mask, or a hood.  Positioning you to lie on your stomach (prone position). This makes it easier for oxygen to get into the lungs.  Continuous positive airway pressure (CPAP) or bi-level positive airway pressure (BPAP) machine. This treatment uses mild air pressure to keep the airways open. A tube that is connected to a motor delivers oxygen to the body.  Ventilator. This treatment moves air into and out of the lungs by using a tube that is placed in your windpipe.  Tracheostomy. This is a procedure to create a hole in the neck so that a breathing tube can be inserted.  Extracorporeal membrane oxygenation (ECMO). This procedure gives the lungs a chance to recover by taking over the functions of the heart and lungs. It supplies oxygen to the body and  removes carbon dioxide. Follow these instructions at home: Lifestyle  If you are sick, stay home except to get medical care. Your health care provider will tell you how long to stay home. Call your health care provider before you go for medical care.  Rest at home as told by your health care provider.  Do not use any products that contain nicotine or tobacco, such as cigarettes, e-cigarettes, and chewing tobacco. If you need help quitting, ask your health care provider.  Return to your normal activities as told by your health care provider. Ask your health care provider what activities are safe for you. General instructions  Take over-the-counter and prescription medicines only as told by your health care provider.  Drink enough fluid to keep your urine pale yellow.  Keep all follow-up visits as told by your health care provider. This is important. How is this prevented?  There is no vaccine to help prevent COVID-19 infection. However, there are steps you can take to protect yourself and others from this virus. To protect yourself:   Do not  travel to areas where COVID-19 is a risk. The areas where COVID-19 is reported change often. To identify high-risk areas and travel restrictions, check the CDC travel website: FatFares.com.br  If you live in, or must travel to, an area where COVID-19 is a risk, take precautions to avoid infection. ? Stay away from people who are sick. ? Wash your hands often with soap and water for 20 seconds. If soap and water are not available, use an alcohol-based hand sanitizer. ? Avoid touching your mouth, face, eyes, or nose. ? Avoid going out in public, follow guidance from your state and local health authorities. ? If you must go out in public, wear a cloth face covering or face mask. Make sure your mask covers your nose and mouth. ? Avoid crowded indoor spaces. Stay at least 6 feet (2 meters) away from others. ? Disinfect objects and surfaces  that are frequently touched every day. This may include:  Counters and tables.  Doorknobs and light switches.  Sinks and faucets.  Electronics, such as phones, remote controls, keyboards, computers, and tablets. To protect others: If you have symptoms of COVID-19, take steps to prevent the virus from spreading to others.  If you think you have a COVID-19 infection, contact your health care provider right away. Tell your health care team that you think you may have a COVID-19 infection.  Stay home. Leave your house only to seek medical care. Do not use public transport.  Do not travel while you are sick.  Wash your hands often with soap and water for 20 seconds. If soap and water are not available, use alcohol-based hand sanitizer.  Stay away from other members of your household. Let healthy household members care for children and pets, if possible. If you have to care for children or pets, wash your hands often and wear a mask. If possible, stay in your own room, separate from others. Use a different bathroom.  Make sure that all people in your household wash their hands well and often.  Cough or sneeze into a tissue or your sleeve or elbow. Do not cough or sneeze into your hand or into the air.  Wear a cloth face covering or face mask. Make sure your mask covers your nose and mouth. Where to find more information  Centers for Disease Control and Prevention: PurpleGadgets.be  World Health Organization: https://www.castaneda.info/ Contact a health care provider if:  You live in or have traveled to an area where COVID-19 is a risk and you have symptoms of the infection.  You have had contact with someone who has COVID-19 and you have symptoms of the infection. Get help right away if:  You have trouble breathing.  You have pain or pressure in your chest.  You have confusion.  You have bluish lips and fingernails.  You have difficulty  waking from sleep.  You have symptoms that get worse. These symptoms may represent a serious problem that is an emergency. Do not wait to see if the symptoms will go away. Get medical help right away. Call your local emergency services (911 in the [place] Do not drive yourself to the hospital. Let the emergency medical personnel know if you think you have COVID-19. Summary  COVID-19 is a respiratory infection that is caused by a virus. It is also known as coronavirus disease or novel coronavirus. It can cause serious infections, such as pneumonia, acute respiratory distress syndrome, acute respiratory failure, or sepsis.  The virus that causes COVID-19 is contagious.  This means that it can spread from person to person through droplets from breathing, speaking, singing, coughing, or sneezing.  You are more likely to develop a serious illness if you are 57 years of age or older, have a weak immune system, live in a nursing home, or have chronic disease.  There is no medicine to treat COVID-19. Your health care provider will talk with you about ways to treat your symptoms.  Take steps to protect yourself and others from infection. Wash your hands often and disinfect objects and surfaces that are frequently touched every day. Stay away from people who are sick and wear a mask if you are sick. This information is not intended to replace advice given to you by your health care provider. Make sure you discuss any questions you have with your health care provider. Document Revised: 12/28/2018 Document Reviewed: 04/05/2018 Elsevier Patient Education  [place].

## 2019-03-19 NOTE — Progress Notes (Signed)
Patient scheduled for outpatient Remdesivir infusion at 530PM on Wednesday 1/6 and Thursday 1/7.  Please advise them to report to [facility] at [address].  Drive to the security guard and tell them you are here for an infusion. They will direct you to the front entrance where we will come and get you.  For questions call [phone].  Thanks

## 2019-03-20 ENCOUNTER — Ambulatory Visit (HOSPITAL_COMMUNITY)
Admission: RE | Admit: 2019-03-20 | Discharge: 2019-03-20 | Disposition: A | Discharge status: Home / Self Care | Payer: Medicare HMO | Source: Ambulatory Visit | Attending: Pulmonary Disease | Admitting: Pulmonary Disease

## 2019-03-20 VITALS — BP 136/97 | HR 62 | Temp 98.7°F | Resp 18

## 2019-03-20 DIAGNOSIS — U071 COVID-19: Secondary | ICD-10-CM | POA: Diagnosis not present

## 2019-03-20 MED ORDER — EPINEPHRINE 0.3 MG/0.3ML IJ SOAJ: 0.3000 mg | Freq: Once | INTRAMUSCULAR | Status: DC | PRN

## 2019-03-20 MED ORDER — SODIUM CHLORIDE 0.9 % IV SOLN: INTRAVENOUS | Status: DC | PRN

## 2019-03-20 MED ORDER — FAMOTIDINE IN NACL 20-0.9 MG/50ML-% IV SOLN: 20.0000 mg | Freq: Once | INTRAVENOUS | Status: DC | PRN

## 2019-03-20 MED ORDER — SODIUM CHLORIDE 0.9 % IV SOLN: 100.0000 mg | Freq: Once | INTRAVENOUS | Status: AC

## 2019-03-20 MED ORDER — SODIUM CHLORIDE 0.9 % IV SOLN
INTRAVENOUS | Status: AC
  Administered 2019-03-20: 100 mg via INTRAVENOUS
  Filled 2019-03-20: qty 20

## 2019-03-20 MED ORDER — DIPHENHYDRAMINE HCL 50 MG/ML IJ SOLN: 50.0000 mg | Freq: Once | INTRAMUSCULAR | Status: DC | PRN

## 2019-03-20 MED ORDER — METHYLPREDNISOLONE SODIUM SUCC 125 MG IJ SOLR: 125.0000 mg | Freq: Once | INTRAMUSCULAR | Status: DC | PRN

## 2019-03-20 MED ORDER — ALBUTEROL SULFATE HFA 108 (90 BASE) MCG/ACT IN AERS: 2.0000 | INHALATION_SPRAY | Freq: Once | RESPIRATORY_TRACT | Status: DC | PRN

## 2019-03-20 NOTE — Progress Notes (Signed)
  Diagnosis: COVID-19  Physician: Dr. Joya Gaskins  Procedure: [facility] Med: remdesivir infusion.  Complications: No immediate complications noted.  Discharge: Discharged home   [place], [place] 03/20/2019

## 2019-03-20 NOTE — Discharge Instructions (Signed)
10 Things You Can Do to Manage Your COVID-19 Symptoms at Home If you have possible or confirmed COVID-19: 1. Stay home from work and school. And stay away from other public places. If you must go out, avoid using any kind of public transportation, ridesharing, or taxis. 2. Monitor your symptoms carefully. If your symptoms get worse, call your healthcare provider immediately. 3. Get rest and stay hydrated. 4. If you have a medical appointment, call the healthcare provider ahead of time and tell them that you have or may have COVID-19. 5. For medical emergencies, call 911 and notify the dispatch personnel that you have or may have COVID-19. 6. Cover your cough and sneezes with a tissue or use the inside of your elbow. 7. Wash your hands often with soap and water for at least 20 seconds or clean your hands with an alcohol-based hand sanitizer that contains at least 60% alcohol. 8. As much as possible, stay in a specific room and away from other people in your home. Also, you should use a separate bathroom, if available. If you need to be around other people in or outside of the home, wear a mask. 9. Avoid sharing personal items with other people in your household, like dishes, towels, and bedding. 10. Clean all surfaces that are touched often, like counters, tabletops, and doorknobs. Use household cleaning sprays or wipes according to the label instructions. cdc.gov/coronavirus 09/12/2018 This information is not intended to replace advice given to you by your health care provider. Make sure you discuss any questions you have with your health care provider. Document Revised: 02/14/2019 Document Reviewed: 02/14/2019 Elsevier Patient Education  2020 Elsevier Inc.  Remdesivir What is this medicine? REMDESIVIR (rem DE si veer) is an antiviral medicine used to treat COVID-19. It will not work for colds, flu, or other viral infections. This medicine may be used for other purposes; ask your health care  provider or pharmacist if you have questions. COMMON BRAND NAME(S): Veklury What should I tell my health care provider before I take this medicine? They need to know if you have any of these conditions:  kidney disease  liver disease  an unusual or allergic reaction to remdesivir, other medicines, foods, dyes, or preservatives  pregnant or trying to get pregnant  breast-feeding How should I use this medicine? This medicine is for infusion into a vein. It is given by a health care professional in a hospital setting. Talk to your pediatrician regarding the use of this medicine in children. While this drug may be prescribed for selected conditions, precautions do apply. Overdosage: If you think you have taken too much of this medicine contact a poison control center or emergency room at once. NOTE: This medicine is only for you. Do not share this medicine with others. What if I miss a dose? This does not apply. What may interact with this medicine? Do not take this medicine with any of the following medications:  certain medicines for seizures like carbamazepine, phenobarbital, phenytoin, primidone  chloroquine  hydroxychloroquine  rifampin  rifapentine  St. John's wort This medicine may also interact with the following medications:  dexamethasone  eslicarbazepine  oxcarbazepine  rifabutin  rufinamide This list may not describe all possible interactions. Give your health care provider a list of all the medicines, herbs, non-prescription drugs, or dietary supplements you use. Also tell them if you smoke, drink alcohol, or use illegal drugs. Some items may interact with your medicine. What should I watch for while using this medicine?   Your condition will be monitored carefully while you are receiving this medicine. Visit your doctor or health care professional for regular check-ups. Tell your doctor if your symptoms do not start to get better or if they get worse. What  side effects may I notice from receiving this medicine? Side effects that you should report to your doctor or health care professional as soon as possible:  allergic reactions like skin rash, itching or hives, swelling of the face, lips, or tongue  breathing problems  fast, irregular heartbeat Side effects that usually do not require medical attention (report these to your doctor or health care professional if they continue or are bothersome):  constipation  diarrhea  headache  nausea This list may not describe all possible side effects. Call your doctor for medical advice about side effects. You may report side effects to FDA at 1-800-FDA-1088. Where should I keep my medicine? This drug is given in a hospital and will not be stored at home. NOTE: This sheet is a summary. It may not cover all possible information. If you have questions about this medicine, talk to your doctor, pharmacist, or health care provider.  2020 Elsevier/Gold Standard (2018-09-05 09:51:25)  

## 2019-03-21 ENCOUNTER — Ambulatory Visit (HOSPITAL_COMMUNITY): Payer: Medicare HMO

## 2019-03-21 LAB — CULTURE, BLOOD (ROUTINE X 2): Culture: NO GROWTH

## 2019-03-22 DIAGNOSIS — N939 Abnormal uterine and vaginal bleeding, unspecified: Secondary | ICD-10-CM | POA: Diagnosis not present

## 2019-03-22 DIAGNOSIS — M47817 Spondylosis without myelopathy or radiculopathy, lumbosacral region: Secondary | ICD-10-CM | POA: Diagnosis not present

## 2019-03-22 DIAGNOSIS — C50511 Malignant neoplasm of lower-outer quadrant of right female breast: Secondary | ICD-10-CM | POA: Diagnosis not present

## 2019-03-22 DIAGNOSIS — M170 Bilateral primary osteoarthritis of knee: Secondary | ICD-10-CM | POA: Diagnosis not present

## 2019-03-22 DIAGNOSIS — M47818 Spondylosis without myelopathy or radiculopathy, sacral and sacrococcygeal region: Secondary | ICD-10-CM | POA: Diagnosis not present

## 2019-03-22 DIAGNOSIS — J9621 Acute and chronic respiratory failure with hypoxia: Secondary | ICD-10-CM | POA: Diagnosis not present

## 2019-03-22 DIAGNOSIS — M47816 Spondylosis without myelopathy or radiculopathy, lumbar region: Secondary | ICD-10-CM | POA: Diagnosis not present

## 2019-03-22 DIAGNOSIS — I10 Essential (primary) hypertension: Secondary | ICD-10-CM | POA: Diagnosis not present

## 2019-03-22 DIAGNOSIS — U071 COVID-19: Secondary | ICD-10-CM | POA: Diagnosis not present

## 2019-03-22 DIAGNOSIS — J9601 Acute respiratory failure with hypoxia: Secondary | ICD-10-CM | POA: Diagnosis not present

## 2019-03-22 DIAGNOSIS — J45909 Unspecified asthma, uncomplicated: Secondary | ICD-10-CM | POA: Diagnosis not present

## 2019-03-22 DIAGNOSIS — D630 Anemia in neoplastic disease: Secondary | ICD-10-CM | POA: Diagnosis not present

## 2019-03-22 DIAGNOSIS — R42 Dizziness and giddiness: Secondary | ICD-10-CM | POA: Diagnosis not present

## 2019-03-23 LAB — CULTURE, BLOOD (ROUTINE X 2)
Culture: NO GROWTH
Culture: NO GROWTH
Special Requests: ADEQUATE

## 2019-03-27 DIAGNOSIS — U071 COVID-19: Secondary | ICD-10-CM | POA: Diagnosis not present

## 2019-03-27 DIAGNOSIS — M47818 Spondylosis without myelopathy or radiculopathy, sacral and sacrococcygeal region: Secondary | ICD-10-CM | POA: Diagnosis not present

## 2019-03-27 DIAGNOSIS — M47816 Spondylosis without myelopathy or radiculopathy, lumbar region: Secondary | ICD-10-CM | POA: Diagnosis not present

## 2019-03-27 DIAGNOSIS — D630 Anemia in neoplastic disease: Secondary | ICD-10-CM | POA: Diagnosis not present

## 2019-03-27 DIAGNOSIS — M170 Bilateral primary osteoarthritis of knee: Secondary | ICD-10-CM | POA: Diagnosis not present

## 2019-03-27 DIAGNOSIS — J9601 Acute respiratory failure with hypoxia: Secondary | ICD-10-CM | POA: Diagnosis not present

## 2019-03-27 DIAGNOSIS — M47817 Spondylosis without myelopathy or radiculopathy, lumbosacral region: Secondary | ICD-10-CM | POA: Diagnosis not present

## 2019-03-27 DIAGNOSIS — J45909 Unspecified asthma, uncomplicated: Secondary | ICD-10-CM | POA: Diagnosis not present

## 2019-03-27 DIAGNOSIS — C50511 Malignant neoplasm of lower-outer quadrant of right female breast: Secondary | ICD-10-CM | POA: Diagnosis not present

## 2019-03-29 DIAGNOSIS — J45909 Unspecified asthma, uncomplicated: Secondary | ICD-10-CM | POA: Diagnosis not present

## 2019-03-29 DIAGNOSIS — C50511 Malignant neoplasm of lower-outer quadrant of right female breast: Secondary | ICD-10-CM | POA: Diagnosis not present

## 2019-03-29 DIAGNOSIS — D630 Anemia in neoplastic disease: Secondary | ICD-10-CM | POA: Diagnosis not present

## 2019-03-29 DIAGNOSIS — M170 Bilateral primary osteoarthritis of knee: Secondary | ICD-10-CM | POA: Diagnosis not present

## 2019-03-29 DIAGNOSIS — U071 COVID-19: Secondary | ICD-10-CM | POA: Diagnosis not present

## 2019-03-29 DIAGNOSIS — J9601 Acute respiratory failure with hypoxia: Secondary | ICD-10-CM | POA: Diagnosis not present

## 2019-03-29 DIAGNOSIS — M47816 Spondylosis without myelopathy or radiculopathy, lumbar region: Secondary | ICD-10-CM | POA: Diagnosis not present

## 2019-03-29 DIAGNOSIS — M47817 Spondylosis without myelopathy or radiculopathy, lumbosacral region: Secondary | ICD-10-CM | POA: Diagnosis not present

## 2019-03-29 DIAGNOSIS — M47818 Spondylosis without myelopathy or radiculopathy, sacral and sacrococcygeal region: Secondary | ICD-10-CM | POA: Diagnosis not present

## 2019-04-03 DIAGNOSIS — M47816 Spondylosis without myelopathy or radiculopathy, lumbar region: Secondary | ICD-10-CM | POA: Diagnosis not present

## 2019-04-03 DIAGNOSIS — D630 Anemia in neoplastic disease: Secondary | ICD-10-CM | POA: Diagnosis not present

## 2019-04-03 DIAGNOSIS — U071 COVID-19: Secondary | ICD-10-CM | POA: Diagnosis not present

## 2019-04-03 DIAGNOSIS — M47817 Spondylosis without myelopathy or radiculopathy, lumbosacral region: Secondary | ICD-10-CM | POA: Diagnosis not present

## 2019-04-03 DIAGNOSIS — M170 Bilateral primary osteoarthritis of knee: Secondary | ICD-10-CM | POA: Diagnosis not present

## 2019-04-03 DIAGNOSIS — J45909 Unspecified asthma, uncomplicated: Secondary | ICD-10-CM | POA: Diagnosis not present

## 2019-04-03 DIAGNOSIS — M47818 Spondylosis without myelopathy or radiculopathy, sacral and sacrococcygeal region: Secondary | ICD-10-CM | POA: Diagnosis not present

## 2019-04-03 DIAGNOSIS — C50511 Malignant neoplasm of lower-outer quadrant of right female breast: Secondary | ICD-10-CM | POA: Diagnosis not present

## 2019-04-03 DIAGNOSIS — J9601 Acute respiratory failure with hypoxia: Secondary | ICD-10-CM | POA: Diagnosis not present

## 2019-04-05 DIAGNOSIS — M47818 Spondylosis without myelopathy or radiculopathy, sacral and sacrococcygeal region: Secondary | ICD-10-CM | POA: Diagnosis not present

## 2019-04-05 DIAGNOSIS — M170 Bilateral primary osteoarthritis of knee: Secondary | ICD-10-CM | POA: Diagnosis not present

## 2019-04-05 DIAGNOSIS — C50511 Malignant neoplasm of lower-outer quadrant of right female breast: Secondary | ICD-10-CM | POA: Diagnosis not present

## 2019-04-05 DIAGNOSIS — M47816 Spondylosis without myelopathy or radiculopathy, lumbar region: Secondary | ICD-10-CM | POA: Diagnosis not present

## 2019-04-05 DIAGNOSIS — M47817 Spondylosis without myelopathy or radiculopathy, lumbosacral region: Secondary | ICD-10-CM | POA: Diagnosis not present

## 2019-04-05 DIAGNOSIS — D630 Anemia in neoplastic disease: Secondary | ICD-10-CM | POA: Diagnosis not present

## 2019-04-05 DIAGNOSIS — U071 COVID-19: Secondary | ICD-10-CM | POA: Diagnosis not present

## 2019-04-05 DIAGNOSIS — J9601 Acute respiratory failure with hypoxia: Secondary | ICD-10-CM | POA: Diagnosis not present

## 2019-04-05 DIAGNOSIS — J45909 Unspecified asthma, uncomplicated: Secondary | ICD-10-CM | POA: Diagnosis not present

## 2019-04-08 DIAGNOSIS — M170 Bilateral primary osteoarthritis of knee: Secondary | ICD-10-CM | POA: Diagnosis not present

## 2019-04-08 DIAGNOSIS — M47818 Spondylosis without myelopathy or radiculopathy, sacral and sacrococcygeal region: Secondary | ICD-10-CM | POA: Diagnosis not present

## 2019-04-08 DIAGNOSIS — U071 COVID-19: Secondary | ICD-10-CM | POA: Diagnosis not present

## 2019-04-08 DIAGNOSIS — J9601 Acute respiratory failure with hypoxia: Secondary | ICD-10-CM | POA: Diagnosis not present

## 2019-04-08 DIAGNOSIS — M47817 Spondylosis without myelopathy or radiculopathy, lumbosacral region: Secondary | ICD-10-CM | POA: Diagnosis not present

## 2019-04-08 DIAGNOSIS — D630 Anemia in neoplastic disease: Secondary | ICD-10-CM | POA: Diagnosis not present

## 2019-04-08 DIAGNOSIS — C50511 Malignant neoplasm of lower-outer quadrant of right female breast: Secondary | ICD-10-CM | POA: Diagnosis not present

## 2019-04-08 DIAGNOSIS — J45909 Unspecified asthma, uncomplicated: Secondary | ICD-10-CM | POA: Diagnosis not present

## 2019-04-08 DIAGNOSIS — M47816 Spondylosis without myelopathy or radiculopathy, lumbar region: Secondary | ICD-10-CM | POA: Diagnosis not present

## 2019-04-09 ENCOUNTER — Other Ambulatory Visit: Payer: Self-pay | Admitting: Oncology

## 2019-04-09 ENCOUNTER — Other Ambulatory Visit: Payer: Self-pay

## 2019-04-09 ENCOUNTER — Ambulatory Visit
Admission: RE | Admit: 2019-04-09 | Discharge: 2019-04-09 | Disposition: A | Discharge status: Home / Self Care | Payer: Medicare HMO | Source: Ambulatory Visit | Attending: Oncology | Admitting: Oncology

## 2019-04-09 DIAGNOSIS — C50511 Malignant neoplasm of lower-outer quadrant of right female breast: Secondary | ICD-10-CM

## 2019-04-09 DIAGNOSIS — N6313 Unspecified lump in the right breast, lower outer quadrant: Secondary | ICD-10-CM | POA: Diagnosis not present

## 2019-04-09 DIAGNOSIS — D560 Alpha thalassemia: Secondary | ICD-10-CM

## 2019-04-09 DIAGNOSIS — D573 Sickle-cell trait: Secondary | ICD-10-CM

## 2019-04-09 DIAGNOSIS — R928 Other abnormal and inconclusive findings on diagnostic imaging of breast: Secondary | ICD-10-CM | POA: Diagnosis not present

## 2019-04-09 HISTORY — DX: Personal history of antineoplastic chemotherapy: Z92.21

## 2019-04-10 DIAGNOSIS — J9601 Acute respiratory failure with hypoxia: Secondary | ICD-10-CM | POA: Diagnosis not present

## 2019-04-10 DIAGNOSIS — J45909 Unspecified asthma, uncomplicated: Secondary | ICD-10-CM | POA: Diagnosis not present

## 2019-04-10 DIAGNOSIS — M47817 Spondylosis without myelopathy or radiculopathy, lumbosacral region: Secondary | ICD-10-CM | POA: Diagnosis not present

## 2019-04-10 DIAGNOSIS — U071 COVID-19: Secondary | ICD-10-CM | POA: Diagnosis not present

## 2019-04-10 DIAGNOSIS — M170 Bilateral primary osteoarthritis of knee: Secondary | ICD-10-CM | POA: Diagnosis not present

## 2019-04-10 DIAGNOSIS — M47818 Spondylosis without myelopathy or radiculopathy, sacral and sacrococcygeal region: Secondary | ICD-10-CM | POA: Diagnosis not present

## 2019-04-10 DIAGNOSIS — C50511 Malignant neoplasm of lower-outer quadrant of right female breast: Secondary | ICD-10-CM | POA: Diagnosis not present

## 2019-04-10 DIAGNOSIS — D630 Anemia in neoplastic disease: Secondary | ICD-10-CM | POA: Diagnosis not present

## 2019-04-10 DIAGNOSIS — M47816 Spondylosis without myelopathy or radiculopathy, lumbar region: Secondary | ICD-10-CM | POA: Diagnosis not present

## 2019-04-17 ENCOUNTER — Inpatient Hospital Stay: Admission: RE | Admit: 2019-04-17 | Payer: Medicare HMO | Source: Ambulatory Visit

## 2019-04-19 DIAGNOSIS — M47816 Spondylosis without myelopathy or radiculopathy, lumbar region: Secondary | ICD-10-CM | POA: Diagnosis not present

## 2019-04-19 DIAGNOSIS — D630 Anemia in neoplastic disease: Secondary | ICD-10-CM | POA: Diagnosis not present

## 2019-04-19 DIAGNOSIS — C50511 Malignant neoplasm of lower-outer quadrant of right female breast: Secondary | ICD-10-CM | POA: Diagnosis not present

## 2019-04-19 DIAGNOSIS — M170 Bilateral primary osteoarthritis of knee: Secondary | ICD-10-CM | POA: Diagnosis not present

## 2019-04-19 DIAGNOSIS — M47817 Spondylosis without myelopathy or radiculopathy, lumbosacral region: Secondary | ICD-10-CM | POA: Diagnosis not present

## 2019-04-19 DIAGNOSIS — J9601 Acute respiratory failure with hypoxia: Secondary | ICD-10-CM | POA: Diagnosis not present

## 2019-04-19 DIAGNOSIS — M47818 Spondylosis without myelopathy or radiculopathy, sacral and sacrococcygeal region: Secondary | ICD-10-CM | POA: Diagnosis not present

## 2019-04-19 DIAGNOSIS — J45909 Unspecified asthma, uncomplicated: Secondary | ICD-10-CM | POA: Diagnosis not present

## 2019-04-19 DIAGNOSIS — U071 COVID-19: Secondary | ICD-10-CM | POA: Diagnosis not present

## 2019-04-21 DIAGNOSIS — C50511 Malignant neoplasm of lower-outer quadrant of right female breast: Secondary | ICD-10-CM | POA: Diagnosis not present

## 2019-04-21 DIAGNOSIS — M170 Bilateral primary osteoarthritis of knee: Secondary | ICD-10-CM | POA: Diagnosis not present

## 2019-04-21 DIAGNOSIS — D630 Anemia in neoplastic disease: Secondary | ICD-10-CM | POA: Diagnosis not present

## 2019-04-21 DIAGNOSIS — M47816 Spondylosis without myelopathy or radiculopathy, lumbar region: Secondary | ICD-10-CM | POA: Diagnosis not present

## 2019-04-21 DIAGNOSIS — M47817 Spondylosis without myelopathy or radiculopathy, lumbosacral region: Secondary | ICD-10-CM | POA: Diagnosis not present

## 2019-04-21 DIAGNOSIS — J9601 Acute respiratory failure with hypoxia: Secondary | ICD-10-CM | POA: Diagnosis not present

## 2019-04-21 DIAGNOSIS — U071 COVID-19: Secondary | ICD-10-CM | POA: Diagnosis not present

## 2019-04-21 DIAGNOSIS — I10 Essential (primary) hypertension: Secondary | ICD-10-CM | POA: Diagnosis not present

## 2019-04-21 DIAGNOSIS — M47818 Spondylosis without myelopathy or radiculopathy, sacral and sacrococcygeal region: Secondary | ICD-10-CM | POA: Diagnosis not present

## 2019-04-21 DIAGNOSIS — J45909 Unspecified asthma, uncomplicated: Secondary | ICD-10-CM | POA: Diagnosis not present

## 2019-04-22 DIAGNOSIS — J9601 Acute respiratory failure with hypoxia: Secondary | ICD-10-CM | POA: Diagnosis not present

## 2019-04-22 DIAGNOSIS — M170 Bilateral primary osteoarthritis of knee: Secondary | ICD-10-CM | POA: Diagnosis not present

## 2019-04-22 DIAGNOSIS — M47818 Spondylosis without myelopathy or radiculopathy, sacral and sacrococcygeal region: Secondary | ICD-10-CM | POA: Diagnosis not present

## 2019-04-22 DIAGNOSIS — J45909 Unspecified asthma, uncomplicated: Secondary | ICD-10-CM | POA: Diagnosis not present

## 2019-04-22 DIAGNOSIS — C50511 Malignant neoplasm of lower-outer quadrant of right female breast: Secondary | ICD-10-CM | POA: Diagnosis not present

## 2019-04-22 DIAGNOSIS — M47816 Spondylosis without myelopathy or radiculopathy, lumbar region: Secondary | ICD-10-CM | POA: Diagnosis not present

## 2019-04-22 DIAGNOSIS — U071 COVID-19: Secondary | ICD-10-CM | POA: Diagnosis not present

## 2019-04-22 DIAGNOSIS — M47817 Spondylosis without myelopathy or radiculopathy, lumbosacral region: Secondary | ICD-10-CM | POA: Diagnosis not present

## 2019-04-22 DIAGNOSIS — D630 Anemia in neoplastic disease: Secondary | ICD-10-CM | POA: Diagnosis not present

## 2019-05-05 NOTE — Progress Notes (Signed)
°[facility]  °[phone] [phone]  ° ° °ID: Kristina Rivera DOB: 04/06/1960  MR#: [id number]  CSN#:[id number] ° °Patient Care Team: °Wolters, Sharon, MD as PCP - General (Family Medicine) °,  C, MD as Consulting Physician (Oncology) °Byerly, Faera, MD as Consulting Physician (General Surgery) °,  C, MD as Consulting Physician (Oncology) °Kinard, James, MD as Consulting Physician (Radiation Oncology) °Kathryn L Daubenspeck °OTHER MD: ° ° °CHIEF COMPLAINT: Triple negative breast cancer ° °CURRENT TREATMENT: observation ° ° °INTERVAL HISTORY: °Kristina Rivera did not show for her appointment 05/06/2019 ° °Since her last visit, she presented for first post-treatment mammogram with palpable thickening near the right lumpectomy scar, which she noticed on self-examination. Bilateral diagnostic mammogram and right breast ultrasound performed on 04/09/2019 showed: breast density category B; indeterminate 0.5 cm mass at 7 o'clock in the right breast, just inferior and medial to the lumpectomy scar; no abnormal right axillary lymph nodes; no evidence of malignancy in the left breast. ° °She is scheduled for ultrasound-guided biopsy of the right breast area in question on 05/20/2019. ° °She contracted Covid-19 and tested positive on 03/16/2019. She was treated in the hospital with Remdesivir and dexamethasone. She was discharged on 03/19/2019 and received another Remdesivir infusion as outpatient on 03/20/2019. ° ° °REVIEW OF SYSTEMS: °Kristina Rivera  ° ° °HISTORY OF CURRENT ILLNESS: °The original intake note: ° °Kristina Rivera had palpated a lump in her left breast. [facility] showing a possible abnormality in both the right and left breasts. She underwent bilateral diagnostic mammography with tomography and bilateral breast ultrasonography on 03/01/2018 showing: 3 cm mass in the 8 o'clock position of the right breast highly suspicious  for breast malignancy; Two borderline abnormal right axillary lymph nodes with cortical thicknesses of 4 mm; indeterminate mass or lesion in the 2 o'clock retroareolar region of the left breast measuring 6 mm and no left axillary adenopathy.  ° °Accordingly on 03/09/2018 she proceeded to biopsy of the breast areas in question. The pathology from this procedure ([id number]) showed: high grade carcinoma with necrosis in the right breast at 8 o'clock; benign right axilla lymph node; benign breast parenchyma in the left breast at 2 o'clock retroareolar. Prognostic indicators significant for: estrogen receptor, 0% negative and progesterone receptor, 0% negative. Proliferation marker Ki67 at 80%. HER2 equivocal (2+) by immunohistochemistry, but negative by fluorescent in situ hybridization with an initial analysis result with a signals ratio 1.22 and number per cell 4.4. A repeat analysis was completed showing a signals ratio 1.13 and number per cell 4.43.  ° °She underwent genetic screening on 03/20/2018 with results pending. ° °The patient's subsequent history is as detailed below. ° ° °PAST MEDICAL HISTORY: °Past Medical History:  °Diagnosis Date  °• Anemia   °• Anxiety   ° pt denies  °• Arthritis   ° "back; knees; shoulders" (04/19/2013)  °• Asthma   ° onset as adult  °• Cancer (HCC)   ° right breast cancer  °• Chronic lower back pain   °• Daily headache   ° "last couple months" (04/19/2013)  °• Depression   ° during a personal crisis  °• Family history of breast cancer   °• Family history of lung cancer   °• Heart murmur   ° as a child - no problems  °• Personal history of chemotherapy   °• PONV (postoperative nausea and vomiting)   ° couple of days of nausea after lumpectomy  °•   Radiation disease   ° pt states she is very sensitive to electromagnetic things - makes her BP go very high, also causes Asthma symptoms   ° ° °PAST SURGICAL HISTORY: °Past Surgical History:  °Procedure Laterality Date  °• BREAST BIOPSY Right  02/2018  ° malignant  °• BREAST BIOPSY Left 02/2018  ° benign  °• BREAST LUMPECTOMY Right 03/2018  °• BREAST LUMPECTOMY WITH AXILLARY LYMPH NODE BIOPSY Right 03/28/2018  ° Procedure: RIGHT BREAST LUMPECTOMY WITH RIGHT SENTINEL LYMPH NODE BIOPSY;  Surgeon: Byerly, Faera, MD;  Location: [facility] OR;  Service: General;  Laterality: Right;  °• CARPAL TUNNEL RELEASE Left 1999?  °• PORT-A-CATH REMOVAL Left 10/18/2018  ° Procedure: REMOVAL PORT-A-CATH;  Surgeon: Byerly, Faera, MD;  Location: [facility] OR;  Service: General;  Laterality: Left;  °• PORTACATH PLACEMENT Left 05/14/2018  ° Procedure: INSERTION PORT-A-CATH;  Surgeon: Byerly, Faera, MD;  Location: [facility];  Service: General;  Laterality: Left;  °• TUBAL LIGATION  1993  ° ° °FAMILY HISTORY: °Family History  °Problem Relation Age of Onset  °• Breast cancer Maternal Aunt 40  °• Pneumonia Mother   °• Lung cancer Mother 76  °• Stroke Father   °• Head & neck cancer Sister 30  °     neck  °• Prostate cancer Neg Hx   ° °She underwent commercial genetic testing and was told she is 25% of European Jewish ancestry. Patient father was 68 years old when he died from stroke. Patient mother died from pneumonia at age 77.  The patient notes a family hx of breast cancer in a maternal aunt diagnosed with breast cancer at age 40.  The patient has 9 siblings, 4 brothers and 5 sisters.  1 sister was diagnosed with neck cancer at age 30, and patient's mom with lung cancer at age 76. ° ° °GYNECOLOGIC HISTORY:  °No LMP recorded. Patient is postmenopausal. °Menarche: 59 years old °Age at first live birth: 59 years old °GX P 5 °LMP around age 49 °Contraceptive: never °HRT: no  °Hysterectomy? no °So? no ° ° °SOCIAL HISTORY:  °She is on disability due to a back injury. Prior to that, she was a CNA at [employer]. Her husband, Norman, is retired. He was a security officer. They have 5 children. Angelo Smith, age 40, is a Ly[employer] driver in [place], [place]. Rhonda Boer, age 31, is a social worker here in  [place]. Vivian Stooksbury Higginbotham, age 30, is a school psychologist in [place], [place]. Norman Lupinacci, Jr., age 28, is a Spectrum technician here in [place]. Shanice Delmar Gillespie, age 26, is a nanny in [place], [place]. She and her husband have two grandchildren, and her husband has 7 grandchildren and 7 great-grandchildren from 2 children from a prior marriage. They attend the [employer].  They tell me their religious affiliation does not involve any medical restrictions ° °ADVANCED DIRECTIVES: The patient and her husband are each others healthcare powers of attorney ° ° °HEALTH MAINTENANCE: °Social History  ° °Tobacco Use  °• Smoking status: Never Smoker  °• Smokeless tobacco: Never Used  °Substance Use Topics  °• Alcohol use: No  °• Drug use: No  ° ° ° Colonoscopy: 2010, Dr. James McKee? ° PAP: 07/2016 ° Bone density: more than 3 years ago °  °Allergies  °Allergen Reactions  °• Aspirin Anaphylaxis and Hives  °• Nsaids Anaphylaxis  °• Other Anaphylaxis  °  MUSHROOMS  ° ° °Current Outpatient Medications  °Medication Sig   Dispense Refill  °• acetaminophen (TYLENOL) 650 MG CR tablet Take 650 mg by mouth every 8 (eight) hours as needed for pain.     °• albuterol (VENTOLIN HFA) 108 (90 BASE) MCG/ACT inhaler Inhale 2 puffs into the lungs as needed. 1 Inhaler 6  °• cholecalciferol (VITAMIN D3) 25 MCG (1000 UT) tablet Take 1 tablet (1,000 Units total) by mouth daily.    °• dexamethasone (DECADRON) 6 MG tablet Take 1 tablet (6 mg total) by mouth daily. 6 tablet 0  °• Omega-3 Fatty Acids (FISH OIL ADULT GUMMIES PO) Take 1 tablet by mouth daily.    °• vitamin C (ASCORBIC ACID) 500 MG tablet Take 500 mg by mouth daily.    °• zinc sulfate 220 (50 Zn) MG capsule Take 1 capsule (220 mg total) by mouth daily. 30 capsule 0  ° °No current facility-administered medications for this visit.  ° ° °OBJECTIVE: Morbidly obese African-American woman  ° °There were no vitals filed for this visit.    °There is no height or weight on file to calculate BMI.   °Wt Readings from Last 3 Encounters:  °03/18/19 (!) 327 lb 9.7 oz (148.6 kg)  °11/01/18 (!) 307 lb 4.8 oz (139.4 kg)  °10/18/18 300 lb (136.1 kg)  ° ° °Nausea ° ° °LAB RESULTS: ° °CMP  °   °Component Value Date/Time  ° NA 142 03/19/2019 0500  ° K 4.2 03/19/2019 0500  ° CL 107 03/19/2019 0500  ° CO2 27 03/19/2019 0500  ° GLUCOSE 123 (H) 03/19/2019 0500  ° BUN 17 03/19/2019 0500  ° CREATININE 0.80 03/19/2019 0500  ° CREATININE 0.83 06/26/2018 1401  ° CREATININE 0.72 11/23/2012 1616  ° CALCIUM 8.5 (L) 03/19/2019 0500  ° PROT 6.8 03/19/2019 0500  ° ALBUMIN 3.0 (L) 03/19/2019 0500  ° AST 47 (H) 03/19/2019 0500  ° AST 25 06/26/2018 1401  ° ALT 39 03/19/2019 0500  ° ALT 19 06/26/2018 1401  ° ALKPHOS 59 03/19/2019 0500  ° BILITOT 0.3 03/19/2019 0500  ° BILITOT 0.4 06/26/2018 1401  ° GFRNONAA >60 03/19/2019 0500  ° GFRNONAA >60 06/26/2018 1401  ° GFRAA >60 03/19/2019 0500  ° GFRAA >60 06/26/2018 1401  ° ° °No results found for: TOTALPROTELP, ALBUMINELP, A1GS, A2GS, BETS, BETA2SER, GAMS, MSPIKE, SPEI ° °No results found for: KPAFRELGTCHN, LAMBDASER, KAPLAMBRATIO ° °Lab Results  °Component Value Date  ° WBC 14.1 (H) 03/19/2019  ° NEUTROABS 2.7 03/16/2019  ° HGB 11.8 (L) 03/19/2019  ° HCT 39.3 03/19/2019  ° MCV 72.8 (L) 03/19/2019  ° PLT 301 03/19/2019  ° °No results found for: LABCA2 ° °No components found for: LABCAN125 ° °No results for input(s): INR in the last 168 hours. ° °No results found for: LABCA2 ° °No results found for: CAN199 ° °No results found for: CAN125 ° °No results found for: CAN153 ° °No results found for: CA2729 ° °No components found for: HGQUANT ° °No results found for: CEA1 / No results found for: CEA1  ° °No results found for: AFPTUMOR ° °No results found for: CHROMOGRNA ° °No results found for: HGBA, HGBA2QUANT, HGBFQUANT, HGBSQUAN °(Hemoglobinopathy evaluation)  ° °Lab Results  °Component Value Date  ° LDH 356 (H) 03/16/2019  ° ° °No results  found for: IRON, TIBC, IRONPCTSAT °(Iron and TIBC) ° °Lab Results  °Component Value Date  ° FERRITIN 250 03/16/2019  ° ° °Urinalysis °   °Component Value Date/Time  ° COLORURINE AMBER (A) 03/16/2019 1741  ° APPEARANCEUR HAZY (A) 03/16/2019 1741  °   LABSPEC 1.025 03/16/2019 1741   PHURINE 5.0 03/16/2019 1741   GLUCOSEU NEGATIVE 03/16/2019 1741   HGBUR NEGATIVE 03/16/2019 1741   BILIRUBINUR NEGATIVE 03/16/2019 1741   KETONESUR NEGATIVE 03/16/2019 1741   PROTEINUR 30 (A) 03/16/2019 1741   NITRITE NEGATIVE 03/16/2019 1741   LEUKOCYTESUR NEGATIVE 03/16/2019 1741    STUDIES: US BREAST LTD UNI RIGHT INC AXILLA  Result Date: 04/09/2019 CLINICAL DATA:  59 year old female with history of RIGHT breast cancer and lumpectomy in 2020. Palpable thickening near the RIGHT lumpectomy scar identified on self-examination. EXAM: DIGITAL DIAGNOSTIC BILATERAL MAMMOGRAM WITH CAD AND TOMO ULTRASOUND RIGHT BREAST COMPARISON:  Previous exam(s). ACR Breast Density Category b: There are scattered areas of fibroglandular density. FINDINGS: 2D and 3D full field views of both breasts and a magnification view of the lumpectomy site are performed. A 0.5 cm partially circumscribed partially obscured mass is identified slightly MEDIAL and INFERIOR to the lumpectomy surgical clips, best seen on the full field views. Lumpectomy changes within the posterior OUTER RIGHT breast identified. No suspicious LEFT breast findings are noted. Mammographic images were processed with CAD. On physical exam, a small palpable superficial nodule is identified at the 7 o'clock position of the RIGHT breast 7 cm from the nipple, just INFERIOR and MEDIAL to the lumpectomy scar. Targeted ultrasound is performed, showing a 0.5 x 0.4 x 0.4 cm slightly irregular superficial hypoechoic mass at the 7 o'clock position of the RIGHT breast 7 cm from the nipple. This mass contains internal vascular flow and corresponds to the palpable finding. No abnormal RIGHT axillary  lymph nodes are identified. IMPRESSION: 1. Indeterminate 0.5 cm mass within the LOWER OUTER RIGHT breast, just INFERIOR and MEDIAL to the lumpectomy scar. Tissue sampling is recommended. 2. No abnormal RIGHT axillary lymph nodes. 3. RIGHT lumpectomy changes 4. No mammographic evidence of LEFT breast malignancy. RECOMMENDATION: Ultrasound-guided RIGHT breast biopsy, which will be scheduled. I have discussed the findings and recommendations with the patient. If applicable, a reminder letter will be sent to the patient regarding the next appointment. BI-RADS CATEGORY  4: Suspicious. Electronically Signed   By: Margarette Canada M.D.   On: 04/09/2019 12:22   MM DIAG BREAST TOMO BILATERAL  Result Date: 04/09/2019 CLINICAL DATA:  59 year old female with history of RIGHT breast cancer and lumpectomy in 2020. Palpable thickening near the RIGHT lumpectomy scar identified on self-examination. EXAM: DIGITAL DIAGNOSTIC BILATERAL MAMMOGRAM WITH CAD AND TOMO ULTRASOUND RIGHT BREAST COMPARISON:  Previous exam(s). ACR Breast Density Category b: There are scattered areas of fibroglandular density. FINDINGS: 2D and 3D full field views of both breasts and a magnification view of the lumpectomy site are performed. A 0.5 cm partially circumscribed partially obscured mass is identified slightly MEDIAL and INFERIOR to the lumpectomy surgical clips, best seen on the full field views. Lumpectomy changes within the posterior OUTER RIGHT breast identified. No suspicious LEFT breast findings are noted. Mammographic images were processed with CAD. On physical exam, a small palpable superficial nodule is identified at the 7 o'clock position of the RIGHT breast 7 cm from the nipple, just INFERIOR and MEDIAL to the lumpectomy scar. Targeted ultrasound is performed, showing a 0.5 x 0.4 x 0.4 cm slightly irregular superficial hypoechoic mass at the 7 o'clock position of the RIGHT breast 7 cm from the nipple. This mass contains internal vascular flow  and corresponds to the palpable finding. No abnormal RIGHT axillary lymph nodes are identified. IMPRESSION: 1. Indeterminate 0.5 cm mass within the LOWER OUTER RIGHT breast, just INFERIOR and  MEDIAL to the lumpectomy scar. Tissue sampling is recommended. 2. No abnormal RIGHT axillary lymph nodes. 3. RIGHT lumpectomy changes 4. No mammographic evidence of LEFT breast malignancy. RECOMMENDATION: Ultrasound-guided RIGHT breast biopsy, which will be scheduled. I have discussed the findings and recommendations with the patient. If applicable, a reminder letter will be sent to the patient regarding the next appointment. BI-RADS CATEGORY  4: Suspicious. Electronically Signed   By: Margarette Canada M.D.   On: 04/09/2019 12:22     ELIGIBLE FOR AVAILABLE RESEARCH PROTOCOL: no   ASSESSMENT: 59 y.o. [place] woman status post right breast lower outer quadrant biopsy 03/09/2018 for a clinical T2N0, stage IIB invasive ductal carcinoma, triple negative, with an MIB-1 of 80%  (1) Genetic testing performed through [facility] Common Hereditary Cancers Panel + EGFR on 03/21/2018 showed no pathogenic mutations in APC, ATM, AXIN2, BARD1, BMPR1A, BRCA1, BRCA2, BRIP1, BUB1B, CDH1, CDK4, CDKN2A, CHEK2, CTNNA1, DICER1, ENG, EPCAM, GALNT12, GREM1, HOXB13, KIT, MEN1, MLH1, MLH3, MSH2, MSH3, MSH6, MUTYH, NBN, NF1, NTHL1, PALB2, PDGFRA, PMS2, POLD1, POLE, PTEN, RAD50, RAD51C, RAD51D, RNF43, RPS20, SDHA, SDHB, SDHC, SDHD, SMAD4, SMARCA4, STK11, TP53, TSC1, TSC2, VHL.   (a) A variant of uncertain significance (VUS) in a gene called ATM was also noted. [id number]>G (p.[id number]).  (2) status post right lumpectomy and sentinel lymph node sampling 03/28/2018 for a pT2 pN0, stage IIB invasive ductal carcinoma, with medullary features, negative margins.  (3) adjuvant chemotherapy consisting of cyclophosphamide, methotrexate and fluorouracil given every 21 days started 05/16/2018, discontinued after 2 cycles by patient (last dose  06/26/2018)  (3) patient opted against adjuvant radiation   (4) likely thalassemia (ferritin 250 on 03/16/2019 with MCV 73.5 and Hb 13.6)  (5) right breast mass, 0.5 cm, biopsy scheduled for 05/20/2019   PLAN: Kristina Rivera did not show for her 05/06/2019 visit.  Follow-up letter has been sent   Kristina Rivera, [name], MD  05/05/19 9:52 AM Medical Oncology and Hematology [facility] [place], [place] [address] Tel. [phone]    Fax. [phone]   I, Wilburn Mylar, am acting as scribe for Dr. [name]. Avaneesh Pepitone.  I, Lurline Del MD, have reviewed the above documentation for accuracy and completeness, and I agree with the above.    *Total Encounter Time as defined by the Centers for Medicare and Medicaid Services includes, in addition to the face-to-face time of a patient visit (documented in the note above) non-face-to-face time: obtaining and reviewing outside history, ordering and reviewing medications, tests or procedures, care coordination (communications with other health care professionals or caregivers) and documentation in the medical record.

## 2019-05-06 ENCOUNTER — Encounter: Payer: Self-pay | Admitting: Oncology

## 2019-05-06 ENCOUNTER — Ambulatory Visit (HOSPITAL_BASED_OUTPATIENT_CLINIC_OR_DEPARTMENT_OTHER): Payer: Medicare HMO | Admitting: Oncology

## 2019-05-06 ENCOUNTER — Other Ambulatory Visit: Payer: Medicare HMO

## 2019-05-06 DIAGNOSIS — J4520 Mild intermittent asthma, uncomplicated: Secondary | ICD-10-CM

## 2019-05-06 DIAGNOSIS — W888XXA Exposure to other ionizing radiation, initial encounter: Secondary | ICD-10-CM | POA: Insufficient documentation

## 2019-05-06 DIAGNOSIS — W888XXD Exposure to other ionizing radiation, subsequent encounter: Secondary | ICD-10-CM

## 2019-05-06 DIAGNOSIS — Z171 Estrogen receptor negative status [ER-]: Secondary | ICD-10-CM

## 2019-05-06 DIAGNOSIS — C50511 Malignant neoplasm of lower-outer quadrant of right female breast: Secondary | ICD-10-CM

## 2019-05-06 DIAGNOSIS — D573 Sickle-cell trait: Secondary | ICD-10-CM

## 2019-05-06 DIAGNOSIS — D563 Thalassemia minor: Secondary | ICD-10-CM

## 2019-05-06 DIAGNOSIS — T66XXXD Radiation sickness, unspecified, subsequent encounter: Secondary | ICD-10-CM

## 2019-05-06 DIAGNOSIS — Z6841 Body Mass Index (BMI) 40.0 and over, adult: Secondary | ICD-10-CM

## 2019-05-17 DIAGNOSIS — U071 COVID-19: Secondary | ICD-10-CM | POA: Diagnosis not present

## 2019-05-20 ENCOUNTER — Ambulatory Visit
Admission: RE | Admit: 2019-05-20 | Discharge: 2019-05-20 | Disposition: A | Discharge status: Home / Self Care | Payer: Medicare HMO | Source: Ambulatory Visit | Attending: Oncology | Admitting: Oncology

## 2019-05-20 ENCOUNTER — Other Ambulatory Visit: Payer: Self-pay

## 2019-05-20 DIAGNOSIS — C50511 Malignant neoplasm of lower-outer quadrant of right female breast: Secondary | ICD-10-CM | POA: Diagnosis not present

## 2019-05-20 DIAGNOSIS — D560 Alpha thalassemia: Secondary | ICD-10-CM

## 2019-05-20 DIAGNOSIS — N6313 Unspecified lump in the right breast, lower outer quadrant: Secondary | ICD-10-CM | POA: Diagnosis not present

## 2019-05-20 DIAGNOSIS — D573 Sickle-cell trait: Secondary | ICD-10-CM

## 2019-05-20 DIAGNOSIS — Z171 Estrogen receptor negative status [ER-]: Secondary | ICD-10-CM

## 2019-05-22 NOTE — Progress Notes (Signed)
[facility]  [phone] [phone]    ID: [name] DOB: 1960/05/12  MR#: [id number]  HCW#:[id number]  Patient Care Team: Jonathon Jordan, MD as PCP - General (Family Medicine) Winter Jocelyn, [name], MD as Consulting Physician (Oncology) Stark Klein, MD as Consulting Physician (General Surgery) Veneda Kirksey, [name], MD as Consulting Physician (Oncology) Gery Pray, MD as Consulting Physician (Radiation Oncology) Chauncey Cruel, MD OTHER MD:   CHIEF COMPLAINT: Triple negative breast cancer  CURRENT TREATMENT: observation   INTERVAL HISTORY: [name] returns today for follow up of her triple negative breast cancer.  Since her last visit, she felt something she thought was a pimple in the right breast.  She presented for first post-treatment mammogram with palpable thickening near the right lumpectomy scar. Bilateral diagnostic mammogram and right breast ultrasound performed on 04/09/2019 showed: breast density category B; indeterminate 0.5 cm mass at 7 o'clock in the right breast, just inferior and medial to the lumpectomy scar; no abnormal right axillary lymph nodes; no evidence of malignancy in the left breast.   She proceeded to ultrasound-guided biopsy of the right breast area in question on 05/20/2019. Pathology from the procedure ([id number]) showed: invasive ductal carcinoma, grade 3. Prognostic indicators significant for: estrogen receptor 70% positive with weak staining intensity, and progesterone receptor 0% negative. Proliferation marker Ki67 at 60%. Her2 negative by immunohistochemistry (1+).  She contracted Covid-19 and tested positive on 03/16/2019. She was treated in the hospital with Remdesivir and dexamethasone. She was discharged on 03/19/2019 and received another Remdesivir infusion as outpatient on 03/20/2019.   REVIEW OF SYSTEMS: Lexus generally has been doing well.  She has constant headaches which she attributes to the  radiation sensitivity.  These are not more intense or persistent than before although they are certainly quite bothersome.   She has had some episodes of postmenopausal bleeding which have not yet been evaluated.  When she is exposed to radiation she has pain in the whole body, not 1 particular area more than another.  Despite the recent COVID-19 infection she does not have a cough or shortness of breath and did not lose her sense of taste or smell.  Incidentally her husband did have a milder version of the same illness.  Their 60 year old son who lives with them did not get infected.   HISTORY OF CURRENT ILLNESS: The original intake note:  Kristina Rivera had palpated a lump in her left breast. She then underwent mammography on 02/23/2018 at the [facility] showing a possible abnormality in both the right and left breasts. She underwent bilateral diagnostic mammography with tomography and bilateral breast ultrasonography on 03/01/2018 showing: 3 cm mass in the 8 o'clock position of the right breast highly suspicious for breast malignancy; Two borderline abnormal right axillary lymph nodes with cortical thicknesses of 4 mm; indeterminate mass or lesion in the 2 o'clock retroareolar region of the left breast measuring 6 mm and no left axillary adenopathy.   Accordingly on 03/09/2018 she proceeded to biopsy of the breast areas in question. The pathology from this procedure ([id number]) showed: high grade carcinoma with necrosis in the right breast at 8 o'clock; benign right axilla lymph node; benign breast parenchyma in the left breast at 2 o'clock retroareolar. Prognostic indicators significant for: estrogen receptor, 0% negative and progesterone receptor, 0% negative. Proliferation marker Ki67 at 80%. HER2 equivocal (2+) by immunohistochemistry, but negative by fluorescent in situ hybridization with an initial analysis result with a signals ratio 1.22 and number per cell  4.4. A repeat analysis was  completed showing a signals ratio 1.13 and number per cell 4.43.   She underwent genetic screening on 03/20/2018 with results pending.  The patient's subsequent history is as detailed below.   PAST MEDICAL HISTORY: Past Medical History:  Diagnosis Date  . Anemia   . Anxiety    pt denies  . Arthritis    "back; knees; shoulders" (04/19/2013)  . Asthma    onset as adult  . Cancer [facility])    right breast cancer  . Chronic lower back pain   . Daily headache    "last couple months" (04/19/2013)  . Depression    during a personal crisis  . Family history of breast cancer   . Family history of lung cancer   . Heart murmur    as a child - no problems  . Personal history of chemotherapy   . PONV (postoperative nausea and vomiting)    couple of days of nausea after lumpectomy  . Radiation disease    pt states she is very sensitive to electromagnetic things - makes her BP go very high, also causes Asthma symptoms     PAST SURGICAL HISTORY: Past Surgical History:  Procedure Laterality Date  . BREAST BIOPSY Right 02/2018   malignant  . BREAST BIOPSY Left 02/2018   benign  . BREAST LUMPECTOMY Right 03/2018  . BREAST LUMPECTOMY WITH AXILLARY LYMPH NODE BIOPSY Right 03/28/2018   Procedure: RIGHT BREAST LUMPECTOMY WITH RIGHT SENTINEL LYMPH NODE BIOPSY;  Surgeon: Stark Klein, MD;  Location: [facility];  Service: General;  Laterality: Right;  . CARPAL TUNNEL RELEASE Left 1999?  Marland Kitchen PORT-A-CATH REMOVAL Left 10/18/2018   Procedure: REMOVAL PORT-A-CATH;  Surgeon: Stark Klein, MD;  Location: [facility];  Service: General;  Laterality: Left;  . PORTACATH PLACEMENT Left 05/14/2018   Procedure: INSERTION PORT-A-CATH;  Surgeon: Stark Klein, MD;  Location: [facility];  Service: General;  Laterality: Left;  . TUBAL LIGATION  1993    FAMILY HISTORY: Family History  Problem Relation Age of Onset  . Breast cancer Maternal Aunt 45  . Pneumonia Mother   . Lung cancer Mother 52  . Stroke Father   . Head & neck  cancer Sister 30       neck  . Prostate cancer Neg Hx    She underwent commercial genetic testing and was told she is 25% of European Jewish ancestry. Patient father was 49 years old when he died from stroke. Patient mother died from pneumonia at age 48.  The patient notes a family hx of breast cancer in a maternal aunt diagnosed with breast cancer at age 54.  The patient has 9 siblings, 4 brothers and 5 sisters.  1 sister was diagnosed with neck cancer at age 86, and patient's mom with lung cancer at age 73.   GYNECOLOGIC HISTORY:  No LMP recorded. Patient is postmenopausal. Menarche: 59 years old Age at first live birth: 59 years old [place] P 5 LMP around age 63 Contraceptive: never HRT: no  Hysterectomy? no So? no   SOCIAL HISTORY:  She is on disability due to a back injury. Prior to that, she was a Quarry manager at [employer]. Her husband, Mariea Clonts, is retired. He was a Animal nutritionist. They have 5 children. Pascal Lux, age 6, is a Community education officer in [place], [place]. Orvis Brill, age 11, is a Education officer, museum here in [place]. Wendy Poet, age 38, is a school Engineer, water in [place], [place]. Craig Guess, Brooke Bonito.,  age 60, is a Customer service manager here in [place]. [place], age 29, is a nanny in [place], [place]. She and her husband have two grandchildren, and her husband has 7 grandchildren and 7 great-grandchildren from 2 children from a prior marriage. They attend the [employer].  They tell me their religious affiliation does not involve any medical restrictions  ADVANCED DIRECTIVES: The patient and her husband are each others healthcare powers of attorney   HEALTH MAINTENANCE: Social History   Tobacco Use  . Smoking status: Never Smoker  . Smokeless tobacco: Never Used  Substance Use Topics  . Alcohol use: No  . Drug use: No     Colonoscopy: 2010, Dr. Vella Kohler?  PAP: 07/2016  Bone density: more than 3 years  ago   Allergies  Allergen Reactions  . Aspirin Anaphylaxis and Hives  . Nsaids Anaphylaxis  . Other Anaphylaxis    MUSHROOMS    Current Outpatient Medications  Medication Sig Dispense Refill  . acetaminophen (TYLENOL) 650 MG CR tablet Take 650 mg by mouth every 8 (eight) hours as needed for pain.     Marland Kitchen albuterol (VENTOLIN HFA) 108 (90 BASE) MCG/ACT inhaler Inhale 2 puffs into the lungs as needed. 1 Inhaler 6  . anastrozole (ARIMIDEX) 1 MG tablet Take 1 tablet (1 mg total) by mouth daily. 90 tablet 4  . cholecalciferol (VITAMIN D3) 25 MCG (1000 UT) tablet Take 1 tablet (1,000 Units total) by mouth daily.    . Omega-3 Fatty Acids (FISH OIL ADULT GUMMIES PO) Take 1 tablet by mouth daily.    . vitamin C (ASCORBIC ACID) 500 MG tablet Take 500 mg by mouth daily.    Marland Kitchen zinc sulfate 220 (50 Zn) MG capsule Take 1 capsule (220 mg total) by mouth daily. 30 capsule 0   No current facility-administered medications for this visit.    OBJECTIVE: Morbidly obese African-American woman in no acute distress  Vitals:   05/23/19 1520  BP: (!) 157/88  Pulse: 82  Resp: 18  Temp: 98.3 F (36.8 C)  SpO2: 93%    Body mass index is 57.93 kg/m.   Wt Readings from Last 3 Encounters:  05/23/19 (!) 316 lb 11.2 oz (143.7 kg)  03/18/19 (!) 327 lb 9.7 oz (148.6 kg)  11/01/18 (!) 307 lb 4.8 oz (139.4 kg)    Sclerae unicteric, EOMs intact Wearing a mask No cervical or supraclavicular adenopathy Lungs no rales or rhonchi Heart regular rate and rhythm Abd soft, obese, nontender, positive bowel sounds MSK no focal spinal tenderness, no upper extremity lymphedema Neuro: nonfocal, well oriented, appropriate affect Breasts: The right breast is status post prior lumpectomy and status post recent biopsy.  There is some induration associated with the biopsy area inferiorly in the breast.  The left breast is benign.  Both axillae are benign.   LAB RESULTS:  CMP     Component Value Date/Time   NA 140  05/23/2019 1502   K 4.7 05/23/2019 1502   CL 100 05/23/2019 1502   CO2 33 (H) 05/23/2019 1502   GLUCOSE 103 (H) 05/23/2019 1502   BUN 10 05/23/2019 1502   CREATININE 0.88 05/23/2019 1502   CREATININE 0.83 06/26/2018 1401   CREATININE 0.72 11/23/2012 1616   CALCIUM 9.6 05/23/2019 1502   PROT 7.9 05/23/2019 1502   ALBUMIN 3.7 05/23/2019 1502   AST 31 05/23/2019 1502   AST 25 06/26/2018 1401   ALT 22 05/23/2019 1502   ALT  19 06/26/2018 1401   ALKPHOS 76 05/23/2019 1502   BILITOT 0.5 05/23/2019 1502   BILITOT 0.4 06/26/2018 1401   GFRNONAA >60 05/23/2019 1502   GFRNONAA >60 06/26/2018 1401   GFRAA >60 05/23/2019 1502   GFRAA >60 06/26/2018 1401    No results found for: TOTALPROTELP, ALBUMINELP, A1GS, A2GS, BETS, BETA2SER, GAMS, MSPIKE, SPEI  No results found for: KPAFRELGTCHN, LAMBDASER, KAPLAMBRATIO  Lab Results  Component Value Date   WBC 8.0 05/23/2019   NEUTROABS 3.9 05/23/2019   HGB 12.5 05/23/2019   HCT 42.5 05/23/2019   MCV 75.0 (L) 05/23/2019   PLT 317 05/23/2019   No results found for: LABCA2  No components found for: IEPPIR518  No results for input(s): INR in the last 168 hours.  No results found for: LABCA2  No results found for: ACZ660  No results found for: YTK160  No results found for: FUX323  No results found for: CA2729  No components found for: HGQUANT  No results found for: CEA1 / No results found for: CEA1   No results found for: AFPTUMOR  No results found for: CHROMOGRNA  No results found for: HGBA, HGBA2QUANT, HGBFQUANT, HGBSQUAN (Hemoglobinopathy evaluation)   Lab Results  Component Value Date   LDH 356 (H) 03/16/2019    No results found for: IRON, TIBC, IRONPCTSAT (Iron and TIBC)  Lab Results  Component Value Date   FERRITIN 250 03/16/2019    Urinalysis    Component Value Date/Time   COLORURINE AMBER (A) 03/16/2019 1741   APPEARANCEUR HAZY (A) 03/16/2019 1741   LABSPEC 1.025 03/16/2019 1741   PHURINE 5.0 03/16/2019  1741   GLUCOSEU NEGATIVE 03/16/2019 1741   HGBUR NEGATIVE 03/16/2019 1741   BILIRUBINUR NEGATIVE 03/16/2019 1741   KETONESUR NEGATIVE 03/16/2019 1741   PROTEINUR 30 (A) 03/16/2019 1741   NITRITE NEGATIVE 03/16/2019 1741   LEUKOCYTESUR NEGATIVE 03/16/2019 1741    STUDIES: MM CLIP PLACEMENT RIGHT  Result Date: 05/20/2019 CLINICAL DATA:  Ultrasound-guided core needle biopsy was performed of a mass in the superficial aspect of the 7:00 to 7:30 position of the right breast approximately 7 cm from the nipple. EXAM: DIAGNOSTIC RIGHT MAMMOGRAM POST ULTRASOUND BIOPSY COMPARISON:  Previous exam(s). FINDINGS: Mammographic images were obtained following ultrasound guided biopsy of a right breast mass. The biopsy marking clip is in expected position at the site of biopsy in the lower outer quadrant inferior to the lumpectomy site. IMPRESSION: Appropriate positioning of the ribbon shaped biopsy marking clip at the site of biopsy in the lower outer quadrant. Final Assessment: Post Procedure Mammograms for Marker Placement Electronically Signed   By: Curlene Dolphin M.D.   On: 05/20/2019 14:06   [place] RT BREAST BX W LOC DEV 1ST LESION IMG BX SPEC US GUIDE  Addendum Date: 05/21/2019   ADDENDUM REPORT: 05/21/2019 14:17 ADDENDUM: Pathology revealed GRADE III INVASIVE DUCTAL CARCINOMA of the Right breast, 7 o'clock to 7:30 o'clock near lumpectomy scar. This was found to be concordant by Dr. Curlene Dolphin. Pathology results were discussed with the patient by telephone. The patient reported doing well after the biopsy with tenderness at the site. Post biopsy instructions and care were reviewed and questions were answered. The patient was encouraged to call [facility] for any additional concerns. Surgical consultation has been arranged with Dr. Stark Klein at [facility] on May 24, 2019. Pathology results reported by Terie Purser, RN on 05/21/2019. Electronically Signed   By: Curlene Dolphin M.D.   On:  05/21/2019 14:17   Result Date: 05/21/2019 CLINICAL DATA:  Ultrasound-guided core needle biopsy was superficial mass in the 7 o'clock axis right breast 7 cm from the nipple. Patient has a personal history lower outer quadrant right breast cancer treated in 2020. EXAM: ULTRASOUND GUIDED RIGHT BREAST CORE NEEDLE BIOPSY COMPARISON:  Previous exam(s). PROCEDURE: I met with the patient and we discussed the procedure of ultrasound-guided biopsy, including benefits and alternatives. We discussed the high likelihood of a successful procedure. We discussed the risks of the procedure, including infection, bleeding, tissue injury, clip migration, and inadequate sampling. Informed written consent was given. The usual time-out protocol was performed immediately prior to the procedure. Lesion quadrant: Lower outer quadrant Using sterile technique and 1% Lidocaine as local anesthetic, under direct ultrasound visualization, a 14 gauge spring-loaded device was used to perform biopsy of a suspicious superficial mass using a lateral approach. At the conclusion of the procedure ribbon tissue marker clip was deployed into the biopsy cavity. Follow up 2 view mammogram was performed and dictated separately. IMPRESSION: Ultrasound guided biopsy of the right breast. No apparent complications. Electronically Signed: By: Curlene Dolphin M.D. On: 05/20/2019 14:07     ELIGIBLE FOR AVAILABLE RESEARCH PROTOCOL: no   ASSESSMENT: 59 y.o. [place] woman status post right breast lower outer quadrant biopsy 03/09/2018 for a clinical T2N0, stage IIB invasive ductal carcinoma, triple negative, with an MIB-1 of 80%  (1) Genetic testing performed through [facility] Common Hereditary Cancers Panel + EGFR on 03/21/2018 showed no pathogenic mutations in APC, ATM, AXIN2, BARD1, BMPR1A, BRCA1, BRCA2, BRIP1, BUB1B, CDH1, CDK4, CDKN2A, CHEK2, CTNNA1, DICER1, ENG, EPCAM, GALNT12, GREM1, HOXB13, KIT, MEN1, MLH1, MLH3, MSH2, MSH3, MSH6,  MUTYH, NBN, NF1, NTHL1, PALB2, PDGFRA, PMS2, POLD1, POLE, PTEN, RAD50, RAD51C, RAD51D, RNF43, RPS20, SDHA, SDHB, SDHC, SDHD, SMAD4, SMARCA4, STK11, TP53, TSC1, TSC2, VHL.   (a) A variant of uncertain significance (VUS) in a gene called ATM was also noted. c.4082A>G (p.[id number]).  (2) status post right lumpectomy and sentinel lymph node sampling 03/28/2018 for a pT2 pN0, stage IIB invasive ductal carcinoma, with medullary features, negative margins.  (3) adjuvant chemotherapy consisting of cyclophosphamide, methotrexate and fluorouracil given every 21 days started 05/16/2018, discontinued after 2 cycles by patient (last dose 06/26/2018)  (3) patient opted against adjuvant radiation   (4) likely thalassemia (ferritin 250 on 03/16/2019 with MCV 73.5 and Hb 13.6)  LOCAL RECURRENCE RIGHT BREAST: (5) biopsy of a new clinically 0.5 cm right breast mass biopsied 05/20/2019 shows invasive ductal carcinoma, grade 3, estrogen receptor positive, HER-2 and progesterone receptor negative  (a) chest CT  (b) bone scan  (c) breast MRI  (6) anastrozole started 05/23/2019  (7) definitive surgery pending   PLAN: Trystyn did not have radiation on after her first right breast surgery because of her concerns regarding radiation sensitivity.  Unfortunately this increased her risk of local recurrence and I do think that is what we are experiencing, even though this tumor is estrogen receptor positive whereas the primary previously was not.  This is not uncommon.  Cancers have many subclone's and likely with the chemotherapy we eliminated the triple negative subclone but we do not get complete pathologic response and estrogen receptor positive cases.  I reviewed the pathology with her.  This is a fast-growing aggressive tumor and it certainly needs to be dealt with.  She is at risk for systemic recurrence as well so I am getting a CT of the chest and a bone scan.  I am also setting  her up for breast MRI in case  there are other areas of local recurrence in the right breast.  She is already scheduled to meet with Dr. Barry Dienes tomorrow and week and can then discuss the options of lumpectomy plus radiation, lumpectomy without radiation, mastectomy, and considerations of reconstruction if she opts for mastectomy.  I started her on anastrozole since she will need this in any case.  This does not increase the risk of clotting so it can be continued right through the time of surgery.  By the time she sees me in about a month she should know if she is going to be able to take this medication for the next 5 years which is my recommendation  She knows to call for any other issue that may develop before the next visit.  Total encounter time 45 minutes.*   Braylie Badami, [name], MD  05/23/19 4:23 PM Medical Oncology and Hematology [facility] [place],  [address] Tel. [phone]    Fax. [phone]   I, Wilburn Mylar, am acting as scribe for Dr. [name]. Perrin Eddleman.  I, Lurline Del MD, have reviewed the above documentation for accuracy and completeness, and I agree with the above.   *Total Encounter Time as defined by the Centers for Medicare and Medicaid Services includes, in addition to the face-to-face time of a patient visit (documented in the note above) non-face-to-face time: obtaining and reviewing outside history, ordering and reviewing medications, tests or procedures, care coordination (communications with other health care professionals or caregivers) and documentation in the medical record.

## 2019-05-23 ENCOUNTER — Inpatient Hospital Stay: Payer: Medicare HMO | Attending: Oncology

## 2019-05-23 ENCOUNTER — Inpatient Hospital Stay (HOSPITAL_BASED_OUTPATIENT_CLINIC_OR_DEPARTMENT_OTHER): Payer: Medicare HMO | Admitting: Oncology

## 2019-05-23 ENCOUNTER — Other Ambulatory Visit: Payer: Self-pay

## 2019-05-23 ENCOUNTER — Telehealth: Payer: Self-pay | Admitting: Oncology

## 2019-05-23 VITALS — BP 157/88 | HR 82 | Temp 98.3°F | Resp 18 | Ht 62.0 in | Wt 316.7 lb

## 2019-05-23 DIAGNOSIS — Z79811 Long term (current) use of aromatase inhibitors: Secondary | ICD-10-CM | POA: Insufficient documentation

## 2019-05-23 DIAGNOSIS — Z6841 Body Mass Index (BMI) 40.0 and over, adult: Secondary | ICD-10-CM | POA: Diagnosis not present

## 2019-05-23 DIAGNOSIS — T66XXXA Radiation sickness, unspecified, initial encounter: Secondary | ICD-10-CM | POA: Diagnosis not present

## 2019-05-23 DIAGNOSIS — C50911 Malignant neoplasm of unspecified site of right female breast: Secondary | ICD-10-CM

## 2019-05-23 DIAGNOSIS — C50511 Malignant neoplasm of lower-outer quadrant of right female breast: Secondary | ICD-10-CM | POA: Diagnosis not present

## 2019-05-23 DIAGNOSIS — Z171 Estrogen receptor negative status [ER-]: Secondary | ICD-10-CM

## 2019-05-23 DIAGNOSIS — W888XXA Exposure to other ionizing radiation, initial encounter: Secondary | ICD-10-CM

## 2019-05-23 DIAGNOSIS — Z170 Estrogen receptor positive status [ER+]: Secondary | ICD-10-CM | POA: Insufficient documentation

## 2019-05-23 DIAGNOSIS — N950 Postmenopausal bleeding: Secondary | ICD-10-CM | POA: Diagnosis not present

## 2019-05-23 LAB — COMPREHENSIVE METABOLIC PANEL
ALT: 22 U/L (ref 0–44)
AST: 31 U/L (ref 15–41)
Albumin: 3.7 g/dL (ref 3.5–5.0)
Alkaline Phosphatase: 76 U/L (ref 38–126)
Anion gap: 7 (ref 5–15)
BUN: 10 mg/dL (ref 6–20)
CO2: 33 mmol/L — ABNORMAL HIGH (ref 22–32)
Calcium: 9.6 mg/dL (ref 8.9–10.3)
Chloride: 100 mmol/L (ref 98–111)
Creatinine, Ser: 0.88 mg/dL (ref 0.44–1.00)
GFR calc Af Amer: >60 mL/min (ref >60)
GFR calc non Af Amer: >60 mL/min (ref >60)
Glucose, Bld: 103 mg/dL — ABNORMAL HIGH (ref 70–99)
Potassium: 4.7 mmol/L (ref 3.5–5.1)
Sodium: 140 mmol/L (ref 135–145)
Total Bilirubin: 0.5 mg/dL (ref 0.3–1.2)
Total Protein: 7.9 g/dL (ref 6.5–8.1)

## 2019-05-23 LAB — CBC WITH DIFFERENTIAL/PLATELET
Abs Immature Granulocytes: 0.04 K/uL (ref 0.00–0.07)
Basophils Absolute: 0.1 K/uL (ref 0.0–0.1)
Basophils Relative: 1 %
Eosinophils Absolute: 0.2 K/uL (ref 0.0–0.5)
Eosinophils Relative: 2 %
HCT: 42.5 % (ref 36.0–46.0)
Hemoglobin: 12.5 g/dL (ref 12.0–15.0)
Immature Granulocytes: 1 %
Lymphocytes Relative: 39 %
Lymphs Abs: 3.1 K/uL (ref 0.7–4.0)
MCH: 22 pg — ABNORMAL LOW (ref 26.0–34.0)
MCHC: 29.4 g/dL — ABNORMAL LOW (ref 30.0–36.0)
MCV: 75 fL — ABNORMAL LOW (ref 80.0–100.0)
Monocytes Absolute: 0.7 K/uL (ref 0.1–1.0)
Monocytes Relative: 9 %
Neutro Abs: 3.9 K/uL (ref 1.7–7.7)
Neutrophils Relative %: 48 %
Platelets: 317 K/uL (ref 150–400)
RBC: 5.67 MIL/uL — ABNORMAL HIGH (ref 3.87–5.11)
RDW: 16.3 % — ABNORMAL HIGH (ref 11.5–15.5)
WBC: 8 K/uL (ref 4.0–10.5)
nRBC: 0 % (ref 0.0–0.2)

## 2019-05-23 MED ORDER — ANASTROZOLE 1 MG PO TABS: 1.0000 mg | ORAL_TABLET | Freq: Every day | ORAL | 4 refills | Status: DC

## 2019-05-23 NOTE — Telephone Encounter (Signed)
Scheduled appts per 3/11 los. Pt confirmed new appt date and time.

## 2019-05-24 DIAGNOSIS — Z171 Estrogen receptor negative status [ER-]: Secondary | ICD-10-CM | POA: Diagnosis not present

## 2019-05-24 DIAGNOSIS — C50511 Malignant neoplasm of lower-outer quadrant of right female breast: Secondary | ICD-10-CM | POA: Diagnosis not present

## 2019-06-03 ENCOUNTER — Other Ambulatory Visit: Payer: Self-pay | Admitting: Oncology

## 2019-06-06 ENCOUNTER — Other Ambulatory Visit: Payer: Self-pay | Admitting: General Surgery

## 2019-06-06 DIAGNOSIS — C50511 Malignant neoplasm of lower-outer quadrant of right female breast: Secondary | ICD-10-CM

## 2019-06-06 DIAGNOSIS — Z171 Estrogen receptor negative status [ER-]: Secondary | ICD-10-CM

## 2019-06-07 ENCOUNTER — Telehealth: Payer: Self-pay | Admitting: *Deleted

## 2019-06-07 ENCOUNTER — Encounter: Payer: Self-pay | Admitting: *Deleted

## 2019-06-07 NOTE — Telephone Encounter (Signed)
This RN informed pt that MD stated MRI of breast is on hold at present.  Pt was concerned per above due to " I had a reaction to gadolinium in my past ".

## 2019-06-10 ENCOUNTER — Encounter: Payer: Self-pay | Admitting: Oncology

## 2019-06-10 NOTE — Progress Notes (Signed)
Patient emailed regarding financial concerns.  Replied back and advised of remaining grant balance.  Provided Selinda Eon [phone]) number in social work for any additional concerns.

## 2019-06-17 ENCOUNTER — Encounter: Payer: Self-pay | Admitting: Licensed Clinical Social Worker

## 2019-06-17 DIAGNOSIS — U071 COVID-19: Secondary | ICD-10-CM | POA: Diagnosis not present

## 2019-06-17 NOTE — Progress Notes (Signed)
[place] Work  Clinical Social Work received voicemail from patient stating she has just received second diagnosis of breast cancer and is stressed financially. 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 It has been more than 12 months, so CSW will mail applications to patient to complete with updated information. Will continue to monitor other foundations to see if additional funds open up.    Follow-up: Patient to complete applications & notify CSW for medical letter.  CSW will follow-up with patient at future medical visits.    Jasmarie Coppock, [place], [place] Worker [place]

## 2019-06-18 ENCOUNTER — Telehealth: Payer: Self-pay | Admitting: *Deleted

## 2019-06-18 MED ORDER — LORAZEPAM 0.5 MG PO TABS: 0.5000 mg | ORAL_TABLET | Freq: Two times a day (BID) | ORAL | 0 refills | Status: DC | PRN

## 2019-06-18 NOTE — Telephone Encounter (Signed)
This RN spoke with pt per her voice mail stating issues of not being able to sleep since she starting the antiestrogen medication.  Kristina Rivera states she started the anastrozole approximately 1 month ago. She takes the medication at noon daily.  She states :" when I go to lay down at night I cannot fall asleep- I am restless and then my mind just keeps going "   She states she starts to doze off but will awake in panic with most severe episode last night.  She states she drank some soursop tea and was able to fall asleep at 6am and slept until noon today.  She states she has not taken her anastrozole today " because I wasn't sure if it was causing my issues "  This RN discussed most common side effect of anastrozole is hot flashes which interfere with sleep.  This RN discussed issues of anxiety and how her mind is processing recurrence of her cancer- which at night our minds can ruminate on and lead to anxiety.   She is presently proceeding with restaging for extent of recurrence for further treatment decisions which the scans due to their radioactivity has concerns for her as well.  Note pt has had panic attacks remotely when she had severe allergies and could not breathe at night- she used ativan at that time with benefit.  This RN discussed use of melatonin which can help induce a better sleep pattern as well as will discuss with MD possible use of ativan for short term relief until she is seen on 4/19.  This RN discussed if MD approves the above that she should not use it every night as the body can build a tolerance and become non effective.  This RN verified pt's pharmacy.  Will review with MD and return call to pt.

## 2019-06-18 NOTE — Telephone Encounter (Signed)
Ativan called in to the patient's pharmacy

## 2019-06-21 ENCOUNTER — Other Ambulatory Visit: Payer: Self-pay

## 2019-06-21 ENCOUNTER — Ambulatory Visit (HOSPITAL_COMMUNITY)
Admission: RE | Admit: 2019-06-21 | Discharge: 2019-06-21 | Disposition: A | Discharge status: Home / Self Care | Payer: Medicare HMO | Source: Ambulatory Visit | Attending: Oncology | Admitting: Oncology

## 2019-06-21 DIAGNOSIS — W888XXA Exposure to other ionizing radiation, initial encounter: Secondary | ICD-10-CM | POA: Insufficient documentation

## 2019-06-21 DIAGNOSIS — K760 Fatty (change of) liver, not elsewhere classified: Secondary | ICD-10-CM | POA: Insufficient documentation

## 2019-06-21 DIAGNOSIS — Z6841 Body Mass Index (BMI) 40.0 and over, adult: Secondary | ICD-10-CM | POA: Insufficient documentation

## 2019-06-21 DIAGNOSIS — R932 Abnormal findings on diagnostic imaging of liver and biliary tract: Secondary | ICD-10-CM | POA: Insufficient documentation

## 2019-06-21 DIAGNOSIS — C50511 Malignant neoplasm of lower-outer quadrant of right female breast: Secondary | ICD-10-CM | POA: Insufficient documentation

## 2019-06-21 DIAGNOSIS — C50911 Malignant neoplasm of unspecified site of right female breast: Secondary | ICD-10-CM

## 2019-06-21 DIAGNOSIS — T66XXXA Radiation sickness, unspecified, initial encounter: Secondary | ICD-10-CM | POA: Insufficient documentation

## 2019-06-21 DIAGNOSIS — Z171 Estrogen receptor negative status [ER-]: Secondary | ICD-10-CM | POA: Diagnosis not present

## 2019-06-21 DIAGNOSIS — C50919 Malignant neoplasm of unspecified site of unspecified female breast: Secondary | ICD-10-CM | POA: Diagnosis not present

## 2019-06-21 DIAGNOSIS — K8020 Calculus of gallbladder without cholecystitis without obstruction: Secondary | ICD-10-CM | POA: Diagnosis not present

## 2019-06-21 MED ORDER — SODIUM CHLORIDE (PF) 0.9 % IJ SOLN
INTRAMUSCULAR | Status: AC
  Filled 2019-06-21: qty 50

## 2019-06-21 MED ORDER — IOHEXOL 300 MG/ML  SOLN
75.0000 mL | Freq: Once | INTRAMUSCULAR | Status: AC | PRN
  Administered 2019-06-21: 75 mL via INTRAVENOUS

## 2019-06-24 ENCOUNTER — Other Ambulatory Visit: Payer: Self-pay | Admitting: Oncology

## 2019-06-25 ENCOUNTER — Encounter: Payer: Self-pay | Admitting: Licensed Clinical Social Worker

## 2019-06-25 ENCOUNTER — Other Ambulatory Visit: Payer: Medicare HMO

## 2019-06-25 ENCOUNTER — Encounter (HOSPITAL_COMMUNITY)
Admission: RE | Admit: 2019-06-25 | Discharge: 2019-06-25 | Disposition: A | Discharge status: Home / Self Care | Payer: Medicare HMO | Source: Ambulatory Visit | Attending: Oncology | Admitting: Oncology

## 2019-06-25 ENCOUNTER — Other Ambulatory Visit: Payer: Self-pay

## 2019-06-25 ENCOUNTER — Ambulatory Visit (HOSPITAL_COMMUNITY)
Admission: RE | Admit: 2019-06-25 | Discharge: 2019-06-25 | Disposition: A | Discharge status: Home / Self Care | Payer: Medicare HMO | Source: Ambulatory Visit | Attending: Oncology | Admitting: Oncology

## 2019-06-25 DIAGNOSIS — T66XXXA Radiation sickness, unspecified, initial encounter: Secondary | ICD-10-CM | POA: Insufficient documentation

## 2019-06-25 DIAGNOSIS — Z171 Estrogen receptor negative status [ER-]: Secondary | ICD-10-CM | POA: Insufficient documentation

## 2019-06-25 DIAGNOSIS — C50911 Malignant neoplasm of unspecified site of right female breast: Secondary | ICD-10-CM | POA: Diagnosis not present

## 2019-06-25 DIAGNOSIS — D252 Subserosal leiomyoma of uterus: Secondary | ICD-10-CM | POA: Insufficient documentation

## 2019-06-25 DIAGNOSIS — C50511 Malignant neoplasm of lower-outer quadrant of right female breast: Secondary | ICD-10-CM

## 2019-06-25 DIAGNOSIS — W888XXA Exposure to other ionizing radiation, initial encounter: Secondary | ICD-10-CM

## 2019-06-25 DIAGNOSIS — Z6841 Body Mass Index (BMI) 40.0 and over, adult: Secondary | ICD-10-CM

## 2019-06-25 DIAGNOSIS — N950 Postmenopausal bleeding: Secondary | ICD-10-CM | POA: Insufficient documentation

## 2019-06-25 MED ORDER — TECHNETIUM TC 99M MEDRONATE IV KIT
20.0000 | PACK | Freq: Once | INTRAVENOUS | Status: AC | PRN
  Administered 2019-06-25: 20.8 via INTRAVENOUS

## 2019-06-25 NOTE — Progress Notes (Signed)
[facility] CSW Progress Note  Clinical Social Worker met with patient to work on applications for assistance. Faxed [employer] application with supporting documents. Received [place] [employer] application from patient, but waiting on copy of a bill which patient will e-mail to this CSW. Provided information on [facility] co-pay assistance.      E  , LCSW 

## 2019-06-26 ENCOUNTER — Encounter: Payer: Self-pay | Admitting: *Deleted

## 2019-06-30 DIAGNOSIS — I272 Pulmonary hypertension, unspecified: Secondary | ICD-10-CM | POA: Insufficient documentation

## 2019-06-30 DIAGNOSIS — K760 Fatty (change of) liver, not elsewhere classified: Secondary | ICD-10-CM | POA: Insufficient documentation

## 2019-06-30 NOTE — Progress Notes (Signed)
Kristina Rivera  [phone] [phone]    ID: Kristina Rivera DOB: 1960/03/24  MR#: [id number]  PJA#:[id number]  Patient Care Team: Jonathon Jordan, MD as PCP - General (Family Medicine) Cayleigh Paull, [name], MD as Consulting Physician (Oncology) Stark Klein, MD as Consulting Physician (General Surgery) Opal Mckellips, [name], MD as Consulting Physician (Oncology) Gery Pray, MD as Consulting Physician (Radiation Oncology) Chauncey Cruel, MD OTHER MD:   CHIEF COMPLAINT: Triple negative breast cancer  CURRENT TREATMENT: Exemestane   INTERVAL HISTORY: Kristina Rivera returns today for follow up of her triple negative breast cancer.  She started on anastrozole at her last visit on 05/23/2019.  She tells me she took it approximately a month, but that she had terrible problems particularly with insomnia.  She either could not fall asleep or she would fall asleep and wake up with hot flashes or sometimes wake up with panic attacks.  She had some lorazepam available but she did not want to use it because she was concerned on becoming dependent with that medication.  Since her last visit, she underwent chest CT on 06/21/2019 showing: 12 mm right axillary lymph node, suspicious for metastatic involvement; 1.7 cm hypervascular lesion in lateral left liver.  She also underwent pelvic ultrasound on 06/25/2019 showing: no abnormality of endometrium; 3.7 cm subserosal uterine fibroid of right.  Bone scan performed the same day showed: indeterminate areas of focal increased tracer activity in sternum and thoracic and lumbar spine; focal uptake in proximal left foot.   REVIEW OF SYSTEMS: Kristina Rivera forgot to bring her protective scarf today so she wanted [place] to go through our meeting as fast as possible.  Despite that we were able to have an adequate discussion of her scans and surgical choices as well as treatment issues as detailed below.  She tells me she has had no new  problems of a physical nature since her last visit here.   HISTORY OF CURRENT ILLNESS: The original intake note:  Kristina Rivera had palpated a lump in her left breast. She then underwent mammography on 02/23/2018 at the [facility] showing a possible abnormality in both the right and left breasts. She underwent bilateral diagnostic mammography with tomography and bilateral breast ultrasonography on 03/01/2018 showing: 3 cm mass in the 8 o'clock position of the right breast highly suspicious for breast malignancy; Two borderline abnormal right axillary lymph nodes with cortical thicknesses of 4 mm; indeterminate mass or lesion in the 2 o'clock retroareolar region of the left breast measuring 6 mm and no left axillary adenopathy.   Accordingly on 03/09/2018 she proceeded to biopsy of the breast areas in question. The pathology from this procedure ([id number]) showed: high grade carcinoma with necrosis in the right breast at 8 o'clock; benign right axilla lymph node; benign breast parenchyma in the left breast at 2 o'clock retroareolar. Prognostic indicators significant for: estrogen receptor, 0% negative and progesterone receptor, 0% negative. Proliferation marker Ki67 at 80%. HER2 equivocal (2+) by immunohistochemistry, but negative by fluorescent in situ hybridization with an initial analysis result with a signals ratio 1.22 and number per cell 4.4. A repeat analysis was completed showing a signals ratio 1.13 and number per cell 4.43.   She underwent genetic screening on 03/20/2018 with results pending.  The patient's subsequent history is as detailed below.   PAST MEDICAL HISTORY: Past Medical History:  Diagnosis Date  . Anemia   . Anxiety    pt denies  . Arthritis    "back; knees;  shoulders" (04/19/2013)  . Asthma    onset as adult  . Cancer [facility])    right breast cancer  . Chronic lower back pain   . Daily headache    "last couple months" (04/19/2013)  . Depression    during a personal  crisis  . Family history of breast cancer   . Family history of lung cancer   . Heart murmur    as a child - no problems  . Personal history of chemotherapy   . PONV (postoperative nausea and vomiting)    couple of days of nausea after lumpectomy  . Radiation disease    pt states she is very sensitive to electromagnetic things - makes her BP go very high, also causes Asthma symptoms     PAST SURGICAL HISTORY: Past Surgical History:  Procedure Laterality Date  . BREAST BIOPSY Right 02/2018   malignant  . BREAST BIOPSY Left 02/2018   benign  . BREAST LUMPECTOMY Right 03/2018  . BREAST LUMPECTOMY WITH AXILLARY LYMPH NODE BIOPSY Right 03/28/2018   Procedure: RIGHT BREAST LUMPECTOMY WITH RIGHT SENTINEL LYMPH NODE BIOPSY;  Surgeon: Stark Klein, MD;  Location: [facility];  Service: General;  Laterality: Right;  . CARPAL TUNNEL RELEASE Left 1999?  Marland Kitchen PORT-A-CATH REMOVAL Left 10/18/2018   Procedure: REMOVAL PORT-A-CATH;  Surgeon: Stark Klein, MD;  Location: [place];  Service: General;  Laterality: Left;  . PORTACATH PLACEMENT Left 05/14/2018   Procedure: INSERTION PORT-A-CATH;  Surgeon: Stark Klein, MD;  Location: [place];  Service: General;  Laterality: Left;  . TUBAL LIGATION  1993    FAMILY HISTORY: Family History  Problem Relation Age of Onset  . Breast cancer Maternal Aunt 44  . Pneumonia Mother   . Lung cancer Mother 15  . Stroke Father   . Head & neck cancer Sister 30       neck  . Prostate cancer Neg Hx    She underwent commercial genetic testing and was told she is 25% of European Jewish ancestry. Patient father was 7 years old when he died from stroke. Patient mother died from pneumonia at age 58.  The patient notes a family hx of breast cancer in a maternal aunt diagnosed with breast cancer at age 26.  The patient has 9 siblings, 4 brothers and 5 sisters.  1 sister was diagnosed with neck cancer at age 57, and patient's mom with lung cancer at age 22.   GYNECOLOGIC HISTORY:  No  LMP recorded. Patient is postmenopausal. Menarche: 59 years old Age at first live birth: 59 years old [place] P 5 LMP around age 35 Contraceptive: never HRT: no  Hysterectomy? no So? no   SOCIAL HISTORY:  She is on disability due to a back injury. Prior to that, she was a Quarry manager at [employer]. Her husband, Kristina Rivera, is retired. He was a Animal nutritionist. They have 5 children. Kristina Rivera, age 21, is a Community education officer in [place], [place]. Kristina Rivera, age 75, is a Education officer, museum here in [place]. Kristina Rivera, age 63, is a school Engineer, water in [place], [place]. Kristina Decamp., age 54, is a Customer service manager here in [place]. 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 She and her husband have two grandchildren, and her husband has 7 grandchildren and 7 great-grandchildren from 2 children from a prior marriage. They attend the [employer].  They tell me their religious affiliation does not involve any medical restrictions  ADVANCED DIRECTIVES: The patient and her husband are each others healthcare powers of attorney   HEALTH MAINTENANCE: Social History   Tobacco Use  . Smoking status: Never Smoker  . Smokeless tobacco: Never Used  Substance Use Topics  . Alcohol use: No  . Drug use: No     Colonoscopy: 2010, Dr. Vella Kohler?  PAP: 07/2016  Bone density: more than 3 years ago   Allergies  Allergen Reactions  . Aspirin Anaphylaxis and Hives  . Nsaids Anaphylaxis  . Other Anaphylaxis    MUSHROOMS    Current Outpatient Medications  Medication Sig Dispense Refill  . acetaminophen (TYLENOL) 650 MG CR tablet Take 650 mg by mouth every 8 (eight) hours as needed for pain.     Marland Kitchen albuterol (VENTOLIN HFA) 108 (90 BASE) MCG/ACT inhaler Inhale 2 puffs into the lungs as needed. 1 Inhaler 6  . cholecalciferol (VITAMIN D3) 25 MCG (1000 UT) tablet Take 1 tablet (1,000 Units total) by mouth daily.    Marland Kitchen exemestane  (AROMASIN) 25 MG tablet Take 1 tablet (25 mg total) by mouth daily after breakfast. 30 tablet 6  . LORazepam (ATIVAN) 0.5 MG tablet Take 1 tablet (0.5 mg total) by mouth 3 times/day as needed-between meals & bedtime for anxiety. 10 tablet 0  . Omega-3 Fatty Acids (FISH OIL ADULT GUMMIES PO) Take 1 tablet by mouth daily.    . vitamin C (ASCORBIC ACID) 500 MG tablet Take 500 mg by mouth daily.    Marland Kitchen zinc sulfate 220 (50 Zn) MG capsule Take 1 capsule (220 mg total) by mouth daily. 30 capsule 0   No current facility-administered medications for this visit.    OBJECTIVE: African-American woman who appears stated age  29:   07/01/19 1528  BP: (!) 167/111  Pulse: 69  Resp: 18  Temp: 97.8 F (36.6 C)  SpO2: 97%    Body mass index is 58.02 kg/m.   Wt Readings from Last 3 Encounters:  07/01/19 (!) 317 lb 3.2 oz (143.9 kg)  05/23/19 (!) 316 lb 11.2 oz (143.7 kg)  03/18/19 (!) 327 lb 9.7 oz (148.6 kg)   Sclerae unicteric, EOMs intact Wearing a mask No cervical or supraclavicular adenopathy Lungs no rales or rhonchi Heart regular rate and rhythm Abd soft, obese, nontender, positive bowel sounds MSK no focal spinal tenderness Neuro: nonfocal, well oriented, anxious affect Breasts: The right breast has undergone lumpectomy and recent biopsy.  I do not palpate a well-defined mass.  The left breast is unremarkable.  Both axilla are benign.   LAB RESULTS:  CMP     Component Value Date/Time   NA 141 07/01/2019 1508   K 4.2 07/01/2019 1508   CL 103 07/01/2019 1508   CO2 30 07/01/2019 1508   GLUCOSE 102 (H) 07/01/2019 1508   BUN 14 07/01/2019 1508   CREATININE 0.96 07/01/2019 1508   CREATININE 0.83 06/26/2018 1401   CREATININE 0.72 11/23/2012 1616   CALCIUM 9.3 07/01/2019 1508   PROT 7.6 07/01/2019 1508   ALBUMIN 3.5 07/01/2019 1508   AST 33 07/01/2019 1508   AST 25 06/26/2018 1401   ALT 27 07/01/2019 1508   ALT 19 06/26/2018 1401   ALKPHOS 68 07/01/2019 1508   BILITOT 0.4  07/01/2019 1508   BILITOT 0.4 06/26/2018 1401   GFRNONAA >60 07/01/2019 1508   GFRNONAA >60 06/26/2018 1401   GFRAA >60 07/01/2019 1508   GFRAA >60 06/26/2018 1401    No results found for: TOTALPROTELP, ALBUMINELP, A1GS, A2GS, BETS,  BETA2SER, GAMS, MSPIKE, SPEI  No results found for: KPAFRELGTCHN, LAMBDASER, [facility]  Lab Results  Component Value Date   WBC 9.7 07/01/2019   NEUTROABS 4.7 07/01/2019   HGB 12.4 07/01/2019   HCT 42.5 07/01/2019   MCV 75.8 (L) 07/01/2019   PLT 315 07/01/2019   No results found for: LABCA2  No components found for: ACZYSA630  No results for input(s): INR in the last 168 hours.  No results found for: LABCA2  No results found for: ZSW109  No results found for: NAT557  No results found for: DUK025  No results found for: CA2729  No components found for: HGQUANT  No results found for: CEA1 / No results found for: CEA1   No results found for: AFPTUMOR  No results found for: CHROMOGRNA  No results found for: HGBA, HGBA2QUANT, HGBFQUANT, HGBSQUAN (Hemoglobinopathy evaluation)   Lab Results  Component Value Date   LDH 356 (H) 03/16/2019    No results found for: IRON, TIBC, IRONPCTSAT (Iron and TIBC)  Lab Results  Component Value Date   FERRITIN 250 03/16/2019    Urinalysis    Component Value Date/Time   COLORURINE AMBER (A) 03/16/2019 1741   APPEARANCEUR HAZY (A) 03/16/2019 1741   LABSPEC 1.025 03/16/2019 1741   PHURINE 5.0 03/16/2019 1741   GLUCOSEU NEGATIVE 03/16/2019 1741   HGBUR NEGATIVE 03/16/2019 1741   BILIRUBINUR NEGATIVE 03/16/2019 1741   KETONESUR NEGATIVE 03/16/2019 1741   PROTEINUR 30 (A) 03/16/2019 1741   NITRITE NEGATIVE 03/16/2019 1741   LEUKOCYTESUR NEGATIVE 03/16/2019 1741    STUDIES: CT Chest W Contrast  Result Date: 06/21/2019 CLINICAL DATA:  Breast cancer. Restaging. EXAM: CT CHEST WITH CONTRAST TECHNIQUE: Multidetector CT imaging of the chest was performed during intravenous contrast  administration. CONTRAST:  32m OMNIPAQUE IOHEXOL 300 MG/ML  SOLN COMPARISON:  None. FINDINGS: Cardiovascular: The heart size is normal. No substantial pericardial effusion. No thoracic aortic aneurysm. Enlargement of the pulmonary outflow tract and main pulmonary arteries suggests pulmonary arterial hypertension. Mediastinum/Nodes: No mediastinal lymphadenopathy. There is no hilar lymphadenopathy. The esophagus has normal imaging features. 12 mm short axis right axillary lymph node identified on 23/2. Surgical clips in scarring noted in the right axilla. No subpectoral lymphadenopathy. No left axillary lymphadenopathy. Lungs/Pleura: Tiny calcified granuloma noted right lower lobe on 100/5. No suspicious pulmonary nodule or mass. No focal airspace consolidation. No pleural effusion. Upper Abdomen: Hypervascular lesion in the lateral segment left liver measures 1.7 cm on image 118/2. The liver shows diffusely decreased attenuation suggesting fat deposition. Calcified gallstones noted. Musculoskeletal: No worrisome lytic or sclerotic osseous abnormality. IMPRESSION: 1. 12 mm short axis right axillary lymph node, suspicious for metastatic involvement. 2. Hypervascular lesion in the lateral segment left liver measuring 1.7 cm. This may be a flash filling hemangioma, but given the history of breast cancer, abdominal MRI without and with contrast recommended to further evaluate. 3. Cholelithiasis. 4. Hepatic steatosis. 5. Enlargement of the pulmonary outflow tract and main pulmonary arteries suggests pulmonary arterial hypertension. Electronically Signed   By: EMisty StanleyM.D.   On: 06/21/2019 12:28   NM Bone Scan Whole Body  Result Date: 06/25/2019 CLINICAL DATA:  Right breast cancer. EXAM: NUCLEAR MEDICINE WHOLE BODY BONE SCAN TECHNIQUE: Whole body anterior and posterior images were obtained approximately 3 hours after intravenous injection of radiopharmaceutical. RADIOPHARMACEUTICALS:  20.8 mCi Technetium-955mDP  IV COMPARISON:  None. FINDINGS: A focus of increased tracer activity is seen within the superior aspect of the sternum on the right. This is  near the expected region of the right sternal clavicular joint. Focal increased tracer activity is also seen within the proximal aspect of the left foot, along the expected region of the left talus. Multiple small focal areas of increased tracer uptake are seen along the bilateral aspects of multiple thoracic spine and lumbar spine vertebral bodies. This extends from the approximate level of T5 through L2. Predominance symmetric increased tracer uptake is seen within the bilateral shoulders and bilateral knees. Physiologic tracer activity is seen within the bilateral kidneys and urinary bladder. IMPRESSION: 1. Focal areas of increased tracer activity within the superior aspect of the sternum and along multiple levels of the thoracic and lumbar spine. These may represent multiple metastatic lesions, however, superimposed degenerative changes throughout the thoracolumbar spine cannot be excluded. 2. Focal uptake within the proximal left foot which may be degenerative versus posttraumatic in origin. 3. Degenerative changes within the bilateral shoulders and bilateral knees. Electronically Signed   By: Virgina Norfolk M.D.   On: 06/25/2019 22:19   US Pelvis Complete  Result Date: 06/25/2019 CLINICAL DATA:  Postmenopausal bleeding EXAM: TRANSABDOMINAL ULTRASOUND OF PELVIS TECHNIQUE: Transabdominal ultrasound examination of the pelvis was performed including evaluation of the uterus, ovaries, adnexal regions, and pelvic cul-de-sac. COMPARISON:  05/02/2012 FINDINGS: Uterus Measurements: 9.6 x 4.7 x 5.4 cm = volume: 128 mL. There is a subserosal fibroid of the right aspect of the posterior uterine fundus measuring 3.7 cm. Endometrium Thickness: 6 mm.  No focal abnormality visualized. Right ovary Nonvisualized. Left ovary Nonvisualized. Other findings:  No abnormal free fluid.  IMPRESSION: 1. No transabdominal ultrasound findings of the pelvis to explain postmenopausal bleeding. No ultrasound abnormality of the endometrium on transabdominal examination. 2. Subserosal uterine fibroid of the right aspect of the posterior uterine fundus measuring 3.7 cm. 3.  The bilateral ovaries are nonvisualized. Electronically Signed   By: Eddie Candle M.D.   On: 06/25/2019 15:00     ELIGIBLE FOR AVAILABLE RESEARCH PROTOCOL: no   ASSESSMENT: 59 y.o. Kristina Rivera woman status post right breast lower outer quadrant biopsy 03/09/2018 for a clinical T2N0, stage IIB invasive ductal carcinoma, triple negative, with an MIB-1 of 80%  (1) Genetic testing performed through [facility] Common Hereditary Cancers Panel + EGFR on 03/21/2018 showed no pathogenic mutations in APC, ATM, AXIN2, BARD1, BMPR1A, BRCA1, BRCA2, BRIP1, BUB1B, CDH1, CDK4, CDKN2A, CHEK2, CTNNA1, DICER1, ENG, EPCAM, GALNT12, GREM1, HOXB13, KIT, MEN1, MLH1, MLH3, MSH2, MSH3, MSH6, MUTYH, NBN, NF1, NTHL1, PALB2, PDGFRA, PMS2, POLD1, POLE, PTEN, RAD50, RAD51C, RAD51D, RNF43, RPS20, SDHA, SDHB, SDHC, SDHD, SMAD4, SMARCA4, STK11, TP53, TSC1, TSC2, VHL.   (a) A variant of uncertain significance (VUS) in a gene called ATM was also noted. c.[id number]>G (p.[id number]).  (2) status post right lumpectomy and sentinel lymph node sampling 03/28/2018 for a pT2 pN0, stage IIB invasive ductal carcinoma, with medullary features, negative margins.  (3) adjuvant chemotherapy consisting of cyclophosphamide, methotrexate and fluorouracil given every 21 days started 05/16/2018, discontinued after 2 cycles by patient (last dose 06/26/2018)  (3) patient opted against adjuvant radiation   (4) likely thalassemia (ferritin 250 on 03/16/2019 with MCV 73.5 and Hb 13.6)  LOCAL RECURRENCE RIGHT BREAST: (5) biopsy of a new clinically 0.5 cm right breast mass biopsied 05/20/2019 shows invasive ductal carcinoma, grade 3, estrogen receptor positive, HER-2 and  progesterone receptor negative  (a) chest CT 06/21/2019 shows a slightly enlarged right axillary lymph node and a hypervascular lesion in the left liver measuring 1.7 cm; there were no were some lung or bone  lesions  (b) bone scan 06/25/2019 shows possible spinal lesions which however may well be degenerative changes; consideration of total spinal MRI  (c) breast MRI  (d) liver MRI  (6) anastrozole started 05/23/2019, discontinued after approximately 1 month with intolerable side effects  (b) exemestane started 07/01/2019  (7) definitive surgery pending   PLAN: Ainslie's scans are not conclusive.  The somewhat abnormal areas in her bone scan could all be degenerative.  The area in the liver which is of concern may be artifactual.  She has a small right axillary lymph node which may be reactive.  Normally we would proceed to MRI of the liver at a minimum, with consideration of biopsy.  We could also consider bone marrow biopsy.  However Anju does not feel she can undergo MRI because of the contrast medium (she understands that there is no radiation involved).  For the same reason she has not had breast MRI preop.  What she prefers to do is receive some systemic treatment and then repeat scans in about 6 months and see if they are stable or improved or worse.  With regards to treatment I did start her on anastrozole but as discussed above she was not able to tolerate it.  Today we discussed exemestane and I have placed the prescription in for her.  I hope she will be able to tolerate this medication which however can be very expensive at times.  We also discussed surgical issues.  She understands the standard here if she is not planning on radiation would be mastectomy.  However she would prefer to have a lumpectomy and not undergo radiation.  This of course is exactly what she did last time.  She will be discussing this further with Dr. Barry Dienes her surgeon.  At this point then the plan is to  start exemestane, proceed to surgery, and see me in 2 months.  If she is tolerating the exemestane well the plan will be to continue that and then restage 6 months from now  She knows to call us for any other issue that may develop before the next visit.  Total encounter time 35 minutes.*   Mikhaila Roh, [name], MD  07/01/19 4:14 PM Medical Oncology and Hematology [facility] [place], [place] [address] Tel. [phone]    Fax. [phone]   I, Wilburn Mylar, am acting as scribe for Dr. [name]. Delicia Berens.  I, Lurline Del MD, have reviewed the above documentation for accuracy and completeness, and I agree with the above.   *Total Encounter Time as defined by the Centers for Medicare and Medicaid Services includes, in addition to the face-to-face time of a patient visit (documented in the note above) non-face-to-face time: obtaining and reviewing outside history, ordering and reviewing medications, tests or procedures, care coordination (communications with other health care professionals or caregivers) and documentation in the medical record.

## 2019-07-01 ENCOUNTER — Inpatient Hospital Stay: Payer: Medicare HMO

## 2019-07-01 ENCOUNTER — Inpatient Hospital Stay: Payer: Medicare HMO | Attending: Oncology | Admitting: Oncology

## 2019-07-01 ENCOUNTER — Other Ambulatory Visit: Payer: Self-pay

## 2019-07-01 VITALS — BP 167/111 | HR 69 | Temp 97.8°F | Resp 18 | Ht 62.0 in | Wt 317.2 lb

## 2019-07-01 DIAGNOSIS — C50511 Malignant neoplasm of lower-outer quadrant of right female breast: Secondary | ICD-10-CM | POA: Diagnosis not present

## 2019-07-01 DIAGNOSIS — K760 Fatty (change of) liver, not elsewhere classified: Secondary | ICD-10-CM

## 2019-07-01 DIAGNOSIS — Z170 Estrogen receptor positive status [ER+]: Secondary | ICD-10-CM | POA: Diagnosis not present

## 2019-07-01 DIAGNOSIS — Z6841 Body Mass Index (BMI) 40.0 and over, adult: Secondary | ICD-10-CM

## 2019-07-01 DIAGNOSIS — C50911 Malignant neoplasm of unspecified site of right female breast: Secondary | ICD-10-CM

## 2019-07-01 DIAGNOSIS — Z171 Estrogen receptor negative status [ER-]: Secondary | ICD-10-CM

## 2019-07-01 DIAGNOSIS — Z79811 Long term (current) use of aromatase inhibitors: Secondary | ICD-10-CM | POA: Insufficient documentation

## 2019-07-01 DIAGNOSIS — I272 Pulmonary hypertension, unspecified: Secondary | ICD-10-CM | POA: Diagnosis not present

## 2019-07-01 LAB — COMPREHENSIVE METABOLIC PANEL
ALT: 27 U/L (ref 0–44)
AST: 33 U/L (ref 15–41)
Albumin: 3.5 g/dL (ref 3.5–5.0)
Alkaline Phosphatase: 68 U/L (ref 38–126)
Anion gap: 8 (ref 5–15)
BUN: 14 mg/dL (ref 6–20)
CO2: 30 mmol/L (ref 22–32)
Calcium: 9.3 mg/dL (ref 8.9–10.3)
Chloride: 103 mmol/L (ref 98–111)
Creatinine, Ser: 0.96 mg/dL (ref 0.44–1.00)
GFR calc Af Amer: >60 mL/min (ref >60)
GFR calc non Af Amer: >60 mL/min (ref >60)
Glucose, Bld: 102 mg/dL — ABNORMAL HIGH (ref 70–99)
Potassium: 4.2 mmol/L (ref 3.5–5.1)
Sodium: 141 mmol/L (ref 135–145)
Total Bilirubin: 0.4 mg/dL (ref 0.3–1.2)
Total Protein: 7.6 g/dL (ref 6.5–8.1)

## 2019-07-01 LAB — CBC WITH DIFFERENTIAL/PLATELET
Abs Immature Granulocytes: 0.07 10*3/uL (ref 0.00–0.07)
Basophils Absolute: 0 10*3/uL (ref 0.0–0.1)
Basophils Relative: 0 %
Eosinophils Absolute: 0.3 10*3/uL (ref 0.0–0.5)
Eosinophils Relative: 3 %
HCT: 42.5 % (ref 36.0–46.0)
Hemoglobin: 12.4 g/dL (ref 12.0–15.0)
Immature Granulocytes: 1 %
Lymphocytes Relative: 39 %
Lymphs Abs: 3.8 10*3/uL (ref 0.7–4.0)
MCH: 22.1 pg — ABNORMAL LOW (ref 26.0–34.0)
MCHC: 29.2 g/dL — ABNORMAL LOW (ref 30.0–36.0)
MCV: 75.8 fL — ABNORMAL LOW (ref 80.0–100.0)
Monocytes Absolute: 0.9 10*3/uL (ref 0.1–1.0)
Monocytes Relative: 9 %
Neutro Abs: 4.7 10*3/uL (ref 1.7–7.7)
Neutrophils Relative %: 48 %
Platelets: 315 10*3/uL (ref 150–400)
RBC: 5.61 MIL/uL — ABNORMAL HIGH (ref 3.87–5.11)
RDW: 15.9 % — ABNORMAL HIGH (ref 11.5–15.5)
WBC: 9.7 10*3/uL (ref 4.0–10.5)
nRBC: 0 % (ref 0.0–0.2)

## 2019-07-01 MED ORDER — EXEMESTANE 25 MG PO TABS: 25.0000 mg | ORAL_TABLET | Freq: Every day | ORAL | 6 refills | Status: DC

## 2019-07-02 ENCOUNTER — Telehealth: Payer: Self-pay | Admitting: Oncology

## 2019-07-02 ENCOUNTER — Encounter: Payer: Self-pay | Admitting: *Deleted

## 2019-07-02 ENCOUNTER — Encounter: Payer: Self-pay | Admitting: Licensed Clinical Social Worker

## 2019-07-02 NOTE — Telephone Encounter (Signed)
Scheduled appt per 4/19 los. Pt confirmed appt date and time.

## 2019-07-02 NOTE — Progress Notes (Signed)
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 Faxed application for financial assistance. Patient notified and will be contacted by [employer] in [place] directly regarding decision about assistance.    Edwinna Areola Trooper Olander , LCSW

## 2019-07-03 ENCOUNTER — Other Ambulatory Visit: Payer: Self-pay | Admitting: General Surgery

## 2019-07-17 DIAGNOSIS — U071 COVID-19: Secondary | ICD-10-CM | POA: Diagnosis not present

## 2019-07-26 NOTE — Progress Notes (Signed)
[facility] [phone] - [place], [place] - [address] [place] [address] Phone: [phone] Fax: [phone]      Your procedure is scheduled on Thursday, Aug 01, 2019.  Report to [facility] Main Entrance "A" at 7:45 A.M., and check in at the Admitting office.  Call this number if you have problems the morning of surgery:  [phone]  Call [phone] if you have any questions prior to your surgery date Monday-Friday 8am-4pm    Remember:  Do not eat after midnight the night before your surgery  You may drink clear liquids until 6:45 AM the morning of your surgery.   Clear liquids allowed are: Water, Non-Citrus Juices (without pulp), Carbonated Beverages, Clear Tea, Black Coffee Only, and Gatorade  Please complete your PRE-SURGERY ENSURE that was provided to you by 6:45 AM the morning of surgery.  Please, if able, drink it in one setting. DO NOT SIP.     Take these medicines the morning of surgery with A SIP OF WATER:  If needed: acetaminophen (TYLENOL) albuterol (VENTOLIN HFA) fluticasone (FLONASE) LORazepam (ATIVAN)   As of today, STOP taking any Aspirin (unless otherwise instructed by your surgeon) and Aspirin containing products, Aleve, Naproxen, Ibuprofen, Motrin, Advil, Goody's, BC's, all herbal medications, fish oil, and all vitamins.                      Do not wear jewelry, make up, or nail polish.            Do not wear lotions, powders, perfumes, or deodorant.            Do not shave 48 hours prior to surgery.            Do not bring valuables to the hospital.            [facility] is not responsible for any belongings or valuables.  Do NOT Smoke (Tobacco/Vapping) or drink Alcohol 24 hours prior to your procedure If you use a CPAP at night, you may bring all equipment for your overnight stay.   Contacts, glasses, dentures or bridgework may not be worn into surgery.      For  patients admitted to the hospital, discharge time will be determined by your treatment team.   Patients discharged the day of surgery will not be allowed to drive home, and someone needs to stay with them for 24 hours.    Special instructions:   [facility]- Preparing For Surgery  Before surgery, you can play an important role. Because skin is not sterile, your skin needs to be as free of germs as possible. You can reduce the number of germs on your skin by washing with CHG (chlorahexidine gluconate) Soap before surgery.  CHG is an antiseptic cleaner which kills germs and bonds with the skin to continue killing germs even after washing.    Oral Hygiene is also important to reduce your risk of infection.  Remember - BRUSH YOUR TEETH THE MORNING OF SURGERY WITH YOUR REGULAR TOOTHPASTE  Please do not use if you have an allergy to CHG or antibacterial soaps. If your skin becomes reddened/irritated stop using the CHG.  Do not shave (including legs and underarms) for at least 48 hours prior to first CHG shower. It is OK to shave your face.  Please follow these instructions carefully.   1. Shower the NIGHT BEFORE SURGERY and the [facility]  OF SURGERY with CHG Soap.   2. If you chose to wash your hair, wash your hair first as usual with your normal shampoo.  3. After you shampoo, rinse your hair and body thoroughly to remove the shampoo.  4. Use CHG as you would any other liquid soap. You can apply CHG directly to the skin and wash gently with a scrungie or a clean washcloth.   5. Apply the CHG Soap to your body ONLY FROM THE NECK DOWN.  Do not use on open wounds or open sores. Avoid contact with your eyes, ears, mouth and genitals (private parts). Wash Face and genitals (private parts)  with your normal soap.   6. Wash thoroughly, paying special attention to the area where your surgery will be performed.  7. Thoroughly rinse your body with warm water from the neck down.  8. DO NOT shower/wash  with your normal soap after using and rinsing off the CHG Soap.  9. Pat yourself dry with a CLEAN TOWEL.  10. Wear CLEAN PAJAMAS to bed the night before surgery, wear comfortable clothes the morning of surgery  11. Place CLEAN SHEETS on your bed the night of your first shower and DO NOT SLEEP WITH PETS.   Day of Surgery:   Do not apply any deodorants/lotions.  Please wear clean clothes to the hospital/surgery center.   Remember to brush your teeth WITH YOUR REGULAR TOOTHPASTE.   Please read over the following fact sheets that you were given.

## 2019-07-29 ENCOUNTER — Encounter (HOSPITAL_COMMUNITY)
Admission: RE | Admit: 2019-07-29 | Discharge: 2019-07-29 | Disposition: A | Discharge status: Home / Self Care | Payer: Medicare HMO | Source: Ambulatory Visit | Attending: General Surgery | Admitting: General Surgery

## 2019-07-29 ENCOUNTER — Other Ambulatory Visit: Payer: Self-pay

## 2019-07-29 ENCOUNTER — Other Ambulatory Visit (HOSPITAL_COMMUNITY)
Admission: RE | Admit: 2019-07-29 | Discharge: 2019-07-29 | Disposition: A | Discharge status: Home / Self Care | Payer: Medicare HMO | Source: Ambulatory Visit | Attending: General Surgery | Admitting: General Surgery

## 2019-07-29 ENCOUNTER — Encounter (HOSPITAL_COMMUNITY): Payer: Self-pay

## 2019-07-29 DIAGNOSIS — Z01812 Encounter for preprocedural laboratory examination: Secondary | ICD-10-CM | POA: Diagnosis not present

## 2019-07-29 DIAGNOSIS — Z20822 Contact with and (suspected) exposure to covid-19: Secondary | ICD-10-CM | POA: Insufficient documentation

## 2019-07-29 DIAGNOSIS — Z01818 Encounter for other preprocedural examination: Secondary | ICD-10-CM | POA: Insufficient documentation

## 2019-07-29 DIAGNOSIS — Z8616 Personal history of COVID-19: Secondary | ICD-10-CM | POA: Insufficient documentation

## 2019-07-29 DIAGNOSIS — C50911 Malignant neoplasm of unspecified site of right female breast: Secondary | ICD-10-CM | POA: Insufficient documentation

## 2019-07-29 DIAGNOSIS — Z853 Personal history of malignant neoplasm of breast: Secondary | ICD-10-CM | POA: Insufficient documentation

## 2019-07-29 DIAGNOSIS — Z9221 Personal history of antineoplastic chemotherapy: Secondary | ICD-10-CM | POA: Insufficient documentation

## 2019-07-29 HISTORY — DX: Palpitations: R00.2

## 2019-07-29 HISTORY — DX: Personal history of urinary calculi: Z87.442

## 2019-07-29 LAB — BASIC METABOLIC PANEL
Anion gap: 10 (ref 5–15)
BUN: 12 mg/dL (ref 6–20)
CO2: 27 mmol/L (ref 22–32)
Calcium: 9.3 mg/dL (ref 8.9–10.3)
Chloride: 102 mmol/L (ref 98–111)
Creatinine, Ser: 0.81 mg/dL (ref 0.44–1.00)
GFR calc Af Amer: >60 mL/min (ref >60)
GFR calc non Af Amer: >60 mL/min (ref >60)
Glucose, Bld: 127 mg/dL — ABNORMAL HIGH (ref 70–99)
Potassium: 4.4 mmol/L (ref 3.5–5.1)
Sodium: 139 mmol/L (ref 135–145)

## 2019-07-29 LAB — CBC
HCT: 42.9 % (ref 36.0–46.0)
Hemoglobin: 12.7 g/dL (ref 12.0–15.0)
MCH: 22.1 pg — ABNORMAL LOW (ref 26.0–34.0)
MCHC: 29.6 g/dL — ABNORMAL LOW (ref 30.0–36.0)
MCV: 74.7 fL — ABNORMAL LOW (ref 80.0–100.0)
Platelets: 312 10*3/uL (ref 150–400)
RBC: 5.74 MIL/uL — ABNORMAL HIGH (ref 3.87–5.11)
RDW: 15.4 % (ref 11.5–15.5)
WBC: 10 10*3/uL (ref 4.0–10.5)
nRBC: 0 % (ref 0.0–0.2)

## 2019-07-29 LAB — SARS CORONAVIRUS 2 (TAT 6-24 HRS): SARS Coronavirus 2: NEGATIVE

## 2019-07-29 NOTE — Progress Notes (Signed)
Patient indicated difficulty paying for medications, one in particular costing ~$600. Case Management order placed.

## 2019-07-29 NOTE — Progress Notes (Signed)
PCP - Jonathon Jordan, MD Cardiologist - Denies  PPM/ICD - Denies  Chest x-ray - 03/16/19 EKG - 03/18/19 (abnormal) Stress Test - Denies ECHO - 04/04/18 Cardiac Cath - Denies  Sleep Study - Denies  Patient denies being a diabetic.  Blood Thinner Instructions: N/A Aspirin Instructions: N/A  ERAS Protcol - Yes PRE-SURGERY Ensure or G2- Ensure given.  COVID TEST- 07/29/19 @ 1000 @ [facility]   Anesthesia review: Yes, current chemo tx.; abnormal EKG; review ECHO  Patient denies shortness of breath, fever, cough and chest pain at PAT appointment   All instructions explained to the patient, with a verbal understanding of the material. Patient agrees to go over the instructions while at home for a better understanding. Patient also instructed to self quarantine after being tested for COVID-19. The opportunity to ask questions was provided.

## 2019-07-30 NOTE — H&P (Signed)
Kristina Rivera Location: [facility] Surgery Patient #: [id number] DOB: 07-Jan-1961 Married / Language: English / Race: Black or African American Female   History of Present Illness The patient is a 59 year old female who presents for a follow-up for Breast cancer. Pt is a 59 yo F who is referred for consultation by Dr. Joneen Caraway for new diagnosis of screening detected right breast cancer. She was found to have a 3 cm mass at 8 o'clock. She had diagnostic imaging confirming this and had a core needle biopsy. Biopsy showed grade carcinoma wtih necrosis, triple negative. A suspicious node was biopsied and was negative. A left sided breast biopsy was negative.   She has family history for breast cancer in her maternal aunt. Her mother had lung cancer and sister had neck cancer. She had menarche in 2014. she had menopause at age 59. She is a G5P5 She has had a colonoscopy in the past as well as a bone density. Last pap smear was 2018. She has electromagnetic sensitivity syndrome. This is quite limiting to her. She also has anxiety and asthma.   She underwent right lumpectomy wtih sentinel lymph node biopsy 03/28/2018. margins/nodes were negative. She developed a seroma that was aspirated by Dr. Excell Seltzer 04/11/18. She was going to get chemo and got a port, did chemo class, and then changed her mind so i took out her port. She also refused radiation last time.   Unfortunately, she recently detected a small "pimple" like area on her lower breast. Dx imaging was concerning and core needle biopsy was positive for breast cancer. This time it was ER +/PR -, and her 2 neg. Ki 67 is 60%.     dx mammo/us 04/09/2019 CLINICAL DATA: 59 year old female with history of RIGHT breast cancer and lumpectomy in 2020. Palpable thickening near the RIGHT lumpectomy scar identified on self-examination.  EXAM: DIGITAL DIAGNOSTIC BILATERAL MAMMOGRAM WITH CAD AND TOMO  ULTRASOUND RIGHT  BREAST  COMPARISON: Previous exam(s).  ACR Breast Density Category b: There are scattered areas of fibroglandular density.  FINDINGS: 2D and 3D full field views of both breasts and a magnification view of the lumpectomy site are performed.  A 0.5 cm partially circumscribed partially obscured mass is identified slightly MEDIAL and INFERIOR to the lumpectomy surgical clips, best seen on the full field views.  Lumpectomy changes within the posterior OUTER RIGHT breast identified.  No suspicious LEFT breast findings are noted.  Mammographic images were processed with CAD.  On physical exam, a small palpable superficial nodule is identified at the 7 o'clock position of the RIGHT breast 7 cm from the nipple, just INFERIOR and MEDIAL to the lumpectomy scar.  Targeted ultrasound is performed, showing a 0.5 x 0.4 x 0.4 cm slightly irregular superficial hypoechoic mass at the 7 o'clock position of the RIGHT breast 7 cm from the nipple. This mass contains internal vascular flow and corresponds to the palpable finding.  No abnormal RIGHT axillary lymph nodes are identified.  IMPRESSION: 1. Indeterminate 0.5 cm mass within the LOWER OUTER RIGHT breast, just INFERIOR and MEDIAL to the lumpectomy scar. Tissue sampling is recommended. 2. No abnormal RIGHT axillary lymph nodes. 3. RIGHT lumpectomy changes 4. No mammographic evidence of LEFT breast malignancy.  RECOMMENDATION: Ultrasound-guided RIGHT breast biopsy, which will be scheduled.  I have discussed the findings and recommendations with the patient. If applicable, a reminder letter will be sent to the patient regarding the next appointment.  BI-RADS CATEGORY 4: Suspicious.  pathology 05/20/2019 Diagnosis  Breast, right, needle core biopsy, 7 o'clock to 7:30 o'clock near lumpectomy scar - INVASIVE DUCTAL CARCINOMA, GRADE III. The tumor cells are NEGATIVE for Her2 (1+). Estrogen Receptor: 70%, POSITIVE, WEAK STAINING  INTENSITY Progesterone Receptor: 0%, NEGATIVE Proliferation Marker Ki67: 60%   Allergies NSAIDs  Anaphylaxis. Aspirin 81 *ANALGESICS - NonNarcotic*  Not specified [place] Mush[employer] *CHEMICALS*   Medication History  Tylenol Extra Strength ('500MG'$  Tablet, Oral) Active. Albuterol (90MCG/ACT Aerosol Soln, Inhalation) Active. Anastrozole ('1MG'$  Tablet, Oral) Active. Vitamin C ('100MG'$  Tablet, Oral) Active. Vitamin D (1000UNIT Tablet, Oral) Active. Zinc (Oral) Specific strength unknown - Active. black seed oil Active.    Review of Systems  All other systems negative  Vitals Weight: 319 lb Height: 62in Body Surface Area: 2.33 m Body Mass Index: 58.35 kg/m  Temp.: 98.81F (Tympanic)  Pulse: 105 (Regular)  P.OX: 92% (Room air)       Physical Exam  General Mental Status-Alert. General Appearance-Consistent with stated age. Hydration-Well hydrated. Voice-Normal.  Head and Neck Head-normocephalic, atraumatic with no lesions or palpable masses.  Eye Sclera/Conjunctiva - Bilateral-No scleral icterus.  Chest and Lung Exam Chest and lung exam reveals -quiet, even and easy respiratory effort with no use of accessory muscles. Inspection Chest Wall - Normal. Back - normal.  Breast Note: around 8 mm lesion just under skin between incision and inframammary fold. minimal thickening at lumpectomy site.   Cardiovascular Cardiovascular examination reveals -normal pedal pulses bilaterally. Note: regular rate and rhythm  Abdomen Inspection-Inspection Normal. Palpation/Percussion Palpation and Percussion of the abdomen reveal - Soft, Non Tender, No Rebound tenderness, No Rigidity (guarding) and No hepatosplenomegaly.  Peripheral Vascular Upper Extremity Inspection - Bilateral - Normal - No Clubbing, No Cyanosis, No Edema, Pulses Intact. Lower Extremity Palpation - Edema - Bilateral - No edema - Bilateral.  Neurologic Neurologic  evaluation reveals -alert and oriented x 3 with no impairment of recent or remote memory. Mental Status-Normal.  Musculoskeletal Global Assessment -Note: no gross deformities.  Normal Exam - Left-Upper Extremity Strength Normal and Lower Extremity Strength Normal. Normal Exam - Right-Upper Extremity Strength Normal and Lower Extremity Strength Normal.  Lymphatic Head & Neck  General Head & Neck Lymphatics: Bilateral - Description - Normal. Axillary  General Axillary Region: Bilateral - Description - Normal. Tenderness - Non Tender.    Assessment & Plan  MALIGNANT NEOPLASM OF LOWER-OUTER QUADRANT OF RIGHT BREAST OF FEMALE, ESTROGEN RECEPTOR NEGATIVE (C50.511) Impression: Recurrent breast cancer, this time ER +. Pt has started anastrozole.  Awaiting breast MRI and staging studies.  discussed mastectomy vs lumpectomy. She is more willing to consider radiation this time and would like to avoid mastectomy. I will use blue dye this time for SLN mapping in addition to Tc 99 Also, I will excise skin over the mass instead of going through previous incision. I discussed importance of radiation in reducing risk of recurrence. Current Plans You are being scheduled for surgery- Our schedulers will call you.  You should hear from our office's scheduling department within 5 working days about the location, date, and time of surgery. We try to make accommodations for patient's preferences in scheduling surgery, but sometimes the OR schedule or the surgeon's schedule prevents [place] from making those accommodations.  If you have not heard from our office [phone]) in 5 working days, call the office and ask for your surgeon's nurse.  If you have other questions about your diagnosis, plan, or surgery, call the office and ask for your surgeon's nurse.  Pt Education - flb breast  cancer surgery: discussed with patient and provided information.

## 2019-07-30 NOTE — Progress Notes (Addendum)
Anesthesia Chart Review:  History of breast CA s/p right lumpectomy and sentinel lymph node sampling 03/28/2018 and adjuvant chemotherapy consisting of cyclophosphamide, methotrexate and fluorouracil, discontinued after 2 cycles by patient (last dose 06/26/2018). Recently underwent ultrasound-guided biopsy of new right breast lesion 05/20/2019. Pathology from the procedure showed invasive ductal carcinoma, grade 3.  She contracted Covid-19 and tested positive on 03/16/2019. She was treated in the hospital with Remdesivir and dexamethasone. She was discharged on 03/19/2019 and received another Remdesivir infusion as outpatient on 03/20/2019. Denied any shortness of breath, fever, cough and chest pain at PAT appointment.  Preop labs reviewed, unremarkable.  EKG 03/16/19: Sinus rhythm. Rate 87. Borderline left axis deviation. Borderline T abnormalities, anterior leads  CT Chest 06/21/19: IMPRESSION: 1. 12 mm short axis right axillary lymph node, suspicious for metastatic involvement. 2. Hypervascular lesion in the lateral segment left liver measuring 1.7 cm. This may be a flash filling hemangioma, but given the history of breast cancer, abdominal MRI without and with contrast recommended to further evaluate. 3. Cholelithiasis. 4. Hepatic steatosis. 5. Enlargement of the pulmonary outflow tract and main pulmonary arteries suggests pulmonary arterial hypertension.  TTE 04/04/18 for chemo monitoring: - Left ventricle: The cavity size was normal. There was mild  concentric hypertrophy. Systolic function was normal. The  estimated ejection fraction was in the range of 55% to 60%. Wall  motion was normal; there were no regional wall motion  abnormalities. Doppler parameters are consistent with abnormal  left ventricular relaxation (grade 1 diastolic dysfunction).  There was no evidence of elevated ventricular filling pressure by  Doppler parameters.  - Aortic valve: There was no  regurgitation.  - Mitral valve: There was trivial regurgitation.  - Left atrium: The atrium was moderately dilated.  - Right ventricle: The cavity size was normal. Wall thickness was  normal. Systolic function was normal.  - Tricuspid valve: There was no regurgitation.  - Pulmonary arteries: Systolic pressure was within the normal  range.  - Inferior vena cava: The vessel was normal in size.  - Pericardium, extracardiac: There was no pericardial effusion.   Impressions:   - LVEF is low normal at 50-55%. Abnormal global longitudinal  strain: -14.7%. No prior study is available for comparison.    Wynonia Musty [facility] Short Stay Center/Anesthesiology Phone [phone] 07/30/2019 2:04 PM

## 2019-07-30 NOTE — Anesthesia Preprocedure Evaluation (Addendum)
Anesthesia Evaluation  Patient identified by MRN, date of birth, ID band Patient awake    Reviewed: Allergy & Precautions, NPO status , Patient's Chart, lab work & pertinent test results  History of Anesthesia Complications (+) PONV and history of anesthetic complications  Airway Mallampati: II  TM Distance: >3 FB Neck ROM: Full    Dental  (+) Missing,    Pulmonary asthma ,    Pulmonary exam normal        Cardiovascular Normal cardiovascular exam  EKG 03/16/19: SR  TTE 04/04/18: mild LVH, EF 0000000, grade 1 diastolic dysfunction, moderate LAE    Neuro/Psych  Headaches, Anxiety Depression    GI/Hepatic negative GI ROS, Neg liver ROS,   Endo/Other  Morbid obesity  Renal/GU negative Renal ROS  negative genitourinary   Musculoskeletal  (+) Arthritis ,   Abdominal   Peds  Hematology negative hematology ROS (+)   Anesthesia Other Findings Day of surgery medications reviewed with patient.  Reproductive/Obstetrics negative OB ROS                            Anesthesia Physical Anesthesia Plan  ASA: III  Anesthesia Plan: General   Post-op Pain Management: GA combined w/ Regional for post-op pain   Induction: Intravenous  PONV Risk Score and Plan: 4 or greater and Treatment may vary due to age or medical condition, Ondansetron, Dexamethasone, Midazolam and Scopolamine patch - Pre-op  Airway Management Planned: Oral ETT  Additional Equipment: None  Intra-op Plan:   Post-operative Plan: Extubation in OR  Informed Consent: I have reviewed the patients History and Physical, chart, labs and discussed the procedure including the risks, benefits and alternatives for the proposed anesthesia with the patient or authorized representative who has indicated his/her understanding and acceptance.     Dental advisory given  Plan Discussed with: CRNA  Anesthesia Plan Comments: (History of breast CA  s/p right lumpectomy and sentinel lymph node sampling 03/28/2018 and adjuvant chemotherapy consisting of cyclophosphamide, methotrexate and fluorouracil, discontinued after 2 cycles by patient (last dose 06/26/2018). Recently underwent ultrasound-guided biopsy of new right breast lesion 05/20/2019. Pathology from the procedure showed invasive ductal carcinoma, grade 3.  She contracted Covid-19 and tested positive on 03/16/2019. She was treated in the hospital with Remdesivir and dexamethasone. She was discharged on 03/19/2019 and received another Remdesivir infusion as outpatient on 03/20/2019. Denied any shortness of breath, fever, cough and chest pain at PAT appointment.  Preop labs reviewed, unremarkable.  EKG 03/16/19: Sinus rhythm. Rate 87. Borderline left axis deviation. Borderline T abnormalities, anterior leads  CT Chest 06/21/19: IMPRESSION: 1. 12 mm short axis right axillary lymph node, suspicious for metastatic involvement. 2. Hypervascular lesion in the lateral segment left liver measuring 1.7 cm. This may be a flash filling hemangioma, but given the history of breast cancer, abdominal MRI without and with contrast recommended to further evaluate. 3. Cholelithiasis. 4. Hepatic steatosis. 5. Enlargement of the pulmonary outflow tract and main pulmonary arteries suggests pulmonary arterial hypertension.  TTE 04/04/18 for chemo monitoring: - Left ventricle: The cavity size was normal. There was mild  concentric hypertrophy. Systolic function was normal. The  estimated ejection fraction was in the range of 55% to 60%. Wall  motion was normal; there were no regional wall motion  abnormalities. Doppler parameters are consistent with abnormal  left ventricular relaxation (grade 1 diastolic dysfunction).  There was no evidence of elevated ventricular filling pressure by  Doppler parameters.  -  Aortic valve: There was no regurgitation.  - Mitral valve: There was trivial  regurgitation.  - Left atrium: The atrium was moderately dilated.  - Right ventricle: The cavity size was normal. Wall thickness was  normal. Systolic function was normal.  - Tricuspid valve: There was no regurgitation.  - Pulmonary arteries: Systolic pressure was within the normal  range.  - Inferior vena cava: The vessel was normal in size.  - Pericardium, extracardiac: There was no pericardial effusion.   Impressions:   - LVEF is low normal at 50-55%. Abnormal global longitudinal  strain: -14.7%. No prior study is available for comparison. )      Anesthesia Quick Evaluation

## 2019-07-31 MED ORDER — DEXTROSE 5 % IV SOLN
3.0000 g | INTRAVENOUS | Status: AC
  Administered 2019-08-01: 3 g via INTRAVENOUS
  Filled 2019-07-31: qty 3
  Filled 2019-07-31: qty 3000

## 2019-08-01 ENCOUNTER — Encounter (HOSPITAL_COMMUNITY)
Admission: RE | Disposition: A | Discharge status: Home / Self Care | Payer: Self-pay | Source: Home / Self Care | Attending: General Surgery

## 2019-08-01 ENCOUNTER — Encounter (HOSPITAL_COMMUNITY): Payer: Self-pay | Admitting: General Surgery

## 2019-08-01 ENCOUNTER — Ambulatory Visit (HOSPITAL_COMMUNITY): Payer: Medicare HMO | Admitting: Anesthesiology

## 2019-08-01 ENCOUNTER — Ambulatory Visit (HOSPITAL_COMMUNITY): Payer: Medicare HMO | Admitting: Physician Assistant

## 2019-08-01 ENCOUNTER — Ambulatory Visit (HOSPITAL_COMMUNITY)
Admission: RE | Admit: 2019-08-01 | Discharge: 2019-08-01 | Disposition: A | Discharge status: Home / Self Care | Payer: Medicare HMO | Source: Ambulatory Visit | Attending: General Surgery | Admitting: General Surgery

## 2019-08-01 ENCOUNTER — Ambulatory Visit (HOSPITAL_COMMUNITY)
Admission: RE | Admit: 2019-08-01 | Discharge: 2019-08-01 | Disposition: A | Discharge status: Home / Self Care | Payer: Medicare HMO | Attending: General Surgery | Admitting: General Surgery

## 2019-08-01 ENCOUNTER — Other Ambulatory Visit: Payer: Self-pay

## 2019-08-01 DIAGNOSIS — Z79899 Other long term (current) drug therapy: Secondary | ICD-10-CM | POA: Insufficient documentation

## 2019-08-01 DIAGNOSIS — Z6841 Body Mass Index (BMI) 40.0 and over, adult: Secondary | ICD-10-CM | POA: Insufficient documentation

## 2019-08-01 DIAGNOSIS — Z803 Family history of malignant neoplasm of breast: Secondary | ICD-10-CM | POA: Diagnosis not present

## 2019-08-01 DIAGNOSIS — Z170 Estrogen receptor positive status [ER+]: Secondary | ICD-10-CM | POA: Insufficient documentation

## 2019-08-01 DIAGNOSIS — C50511 Malignant neoplasm of lower-outer quadrant of right female breast: Secondary | ICD-10-CM | POA: Diagnosis not present

## 2019-08-01 DIAGNOSIS — N6091 Unspecified benign mammary dysplasia of right breast: Secondary | ICD-10-CM | POA: Diagnosis not present

## 2019-08-01 DIAGNOSIS — Z808 Family history of malignant neoplasm of other organs or systems: Secondary | ICD-10-CM | POA: Insufficient documentation

## 2019-08-01 DIAGNOSIS — Z79811 Long term (current) use of aromatase inhibitors: Secondary | ICD-10-CM | POA: Diagnosis not present

## 2019-08-01 DIAGNOSIS — Z801 Family history of malignant neoplasm of trachea, bronchus and lung: Secondary | ICD-10-CM | POA: Diagnosis not present

## 2019-08-01 DIAGNOSIS — J45909 Unspecified asthma, uncomplicated: Secondary | ICD-10-CM | POA: Diagnosis not present

## 2019-08-01 DIAGNOSIS — N6489 Other specified disorders of breast: Secondary | ICD-10-CM | POA: Diagnosis not present

## 2019-08-01 DIAGNOSIS — G8918 Other acute postprocedural pain: Secondary | ICD-10-CM | POA: Diagnosis not present

## 2019-08-01 DIAGNOSIS — C50912 Malignant neoplasm of unspecified site of left female breast: Secondary | ICD-10-CM | POA: Diagnosis not present

## 2019-08-01 DIAGNOSIS — N6011 Diffuse cystic mastopathy of right breast: Secondary | ICD-10-CM | POA: Diagnosis not present

## 2019-08-01 DIAGNOSIS — Z886 Allergy status to analgesic agent status: Secondary | ICD-10-CM | POA: Diagnosis not present

## 2019-08-01 DIAGNOSIS — Z171 Estrogen receptor negative status [ER-]: Secondary | ICD-10-CM | POA: Diagnosis not present

## 2019-08-01 HISTORY — PX: SENTINEL NODE BIOPSY: SHX6608

## 2019-08-01 HISTORY — PX: BREAST LUMPECTOMY WITH SENTINEL LYMPH NODE BIOPSY: SHX5597

## 2019-08-01 SURGERY — BREAST LUMPECTOMY WITH SENTINEL LYMPH NODE BX
Anesthesia: General | Site: Breast | Laterality: Right

## 2019-08-01 MED ORDER — FENTANYL CITRATE (PF) 100 MCG/2ML IJ SOLN
INTRAMUSCULAR | Status: DC | PRN
  Administered 2019-08-01 (×2): 50 ug via INTRAVENOUS

## 2019-08-01 MED ORDER — FENTANYL CITRATE (PF) 100 MCG/2ML IJ SOLN
INTRAMUSCULAR | Status: AC
  Administered 2019-08-01: 50 ug via INTRAVENOUS
  Filled 2019-08-01: qty 2

## 2019-08-01 MED ORDER — FENTANYL CITRATE (PF) 100 MCG/2ML IJ SOLN: 25.0000 ug | INTRAMUSCULAR | Status: DC | PRN

## 2019-08-01 MED ORDER — OXYCODONE HCL 5 MG/5ML PO SOLN: 5.0000 mg | Freq: Once | ORAL | Status: AC | PRN

## 2019-08-01 MED ORDER — FENTANYL CITRATE (PF) 100 MCG/2ML IJ SOLN: 50.0000 ug | Freq: Once | INTRAMUSCULAR | Status: AC

## 2019-08-01 MED ORDER — SUCCINYLCHOLINE CHLORIDE 200 MG/10ML IV SOSY
PREFILLED_SYRINGE | INTRAVENOUS | Status: DC | PRN
  Administered 2019-08-01: 200 mg via INTRAVENOUS

## 2019-08-01 MED ORDER — SODIUM CHLORIDE (PF) 0.9 % IJ SOLN
INTRAMUSCULAR | Status: AC
  Filled 2019-08-01: qty 10

## 2019-08-01 MED ORDER — OXYCODONE HCL 5 MG PO TABS
ORAL_TABLET | ORAL | Status: AC
  Filled 2019-08-01: qty 1

## 2019-08-01 MED ORDER — ACETAMINOPHEN 500 MG PO TABS: 1000.0000 mg | ORAL_TABLET | Freq: Once | ORAL | Status: DC

## 2019-08-01 MED ORDER — DEXAMETHASONE SODIUM PHOSPHATE 10 MG/ML IJ SOLN
INTRAMUSCULAR | Status: DC | PRN
  Administered 2019-08-01: 5 mg via INTRAVENOUS

## 2019-08-01 MED ORDER — PHENYLEPHRINE 40 MCG/ML (10ML) SYRINGE FOR IV PUSH (FOR BLOOD PRESSURE SUPPORT)
PREFILLED_SYRINGE | INTRAVENOUS | Status: AC
  Filled 2019-08-01: qty 10

## 2019-08-01 MED ORDER — METHYLENE BLUE 0.5 % INJ SOLN
INTRAVENOUS | Status: AC
  Filled 2019-08-01: qty 10

## 2019-08-01 MED ORDER — ENSURE PRE-SURGERY PO LIQD: 296.0000 mL | Freq: Once | ORAL | Status: DC

## 2019-08-01 MED ORDER — SUCCINYLCHOLINE CHLORIDE 200 MG/10ML IV SOSY
PREFILLED_SYRINGE | INTRAVENOUS | Status: AC
  Filled 2019-08-01: qty 10

## 2019-08-01 MED ORDER — BUPIVACAINE-EPINEPHRINE (PF) 0.5% -1:200000 IJ SOLN
INTRAMUSCULAR | Status: DC | PRN
  Administered 2019-08-01: 20 mL via PERINEURAL

## 2019-08-01 MED ORDER — CHLORHEXIDINE GLUCONATE 0.12 % MT SOLN
15.0000 mL | Freq: Once | OROMUCOSAL | Status: AC
  Administered 2019-08-01: 15 mL via OROMUCOSAL
  Filled 2019-08-01: qty 15

## 2019-08-01 MED ORDER — MIDAZOLAM HCL 2 MG/2ML IJ SOLN: 1.0000 mg | Freq: Once | INTRAMUSCULAR | Status: AC

## 2019-08-01 MED ORDER — BUPIVACAINE HCL (PF) 0.25 % IJ SOLN
INTRAMUSCULAR | Status: AC
  Filled 2019-08-01: qty 30

## 2019-08-01 MED ORDER — TECHNETIUM TC 99M SULFUR COLLOID FILTERED
1.0000 | Freq: Once | INTRAVENOUS | Status: AC | PRN
  Administered 2019-08-01: 1 via INTRADERMAL

## 2019-08-01 MED ORDER — LIDOCAINE-EPINEPHRINE 1 %-1:100000 IJ SOLN
INTRAMUSCULAR | Status: DC | PRN
  Administered 2019-08-01: 18 mL

## 2019-08-01 MED ORDER — OXYCODONE HCL 5 MG PO TABS
5.0000 mg | ORAL_TABLET | Freq: Four times a day (QID) | ORAL | 0 refills | Status: DC | PRN
Start: 2019-08-01 — End: 2019-08-13

## 2019-08-01 MED ORDER — MIDAZOLAM HCL 2 MG/2ML IJ SOLN
INTRAMUSCULAR | Status: AC
  Administered 2019-08-01: 1 mg via INTRAVENOUS
  Filled 2019-08-01: qty 2

## 2019-08-01 MED ORDER — PROMETHAZINE HCL 25 MG/ML IJ SOLN: 6.2500 mg | INTRAMUSCULAR | Status: DC | PRN

## 2019-08-01 MED ORDER — FENTANYL CITRATE (PF) 100 MCG/2ML IJ SOLN
INTRAMUSCULAR | Status: AC
  Filled 2019-08-01: qty 2

## 2019-08-01 MED ORDER — CHLORHEXIDINE GLUCONATE CLOTH 2 % EX PADS: 6.0000 | MEDICATED_PAD | Freq: Once | CUTANEOUS | Status: DC

## 2019-08-01 MED ORDER — LIDOCAINE-EPINEPHRINE 1 %-1:100000 IJ SOLN
INTRAMUSCULAR | Status: AC
  Filled 2019-08-01: qty 1

## 2019-08-01 MED ORDER — PROPOFOL 10 MG/ML IV BOLUS
INTRAVENOUS | Status: AC
  Filled 2019-08-01: qty 40

## 2019-08-01 MED ORDER — GABAPENTIN 100 MG PO CAPS
100.0000 mg | ORAL_CAPSULE | ORAL | Status: AC
  Administered 2019-08-01: 100 mg via ORAL
  Filled 2019-08-01: qty 1

## 2019-08-01 MED ORDER — DEXAMETHASONE SODIUM PHOSPHATE 10 MG/ML IJ SOLN
INTRAMUSCULAR | Status: AC
  Filled 2019-08-01: qty 1

## 2019-08-01 MED ORDER — ACETAMINOPHEN 500 MG PO TABS
1000.0000 mg | ORAL_TABLET | ORAL | Status: AC
  Administered 2019-08-01: 1000 mg via ORAL
  Filled 2019-08-01: qty 2

## 2019-08-01 MED ORDER — ROCURONIUM BROMIDE 10 MG/ML (PF) SYRINGE
PREFILLED_SYRINGE | INTRAVENOUS | Status: AC
  Filled 2019-08-01: qty 10

## 2019-08-01 MED ORDER — LIDOCAINE 2% (20 MG/ML) 5 ML SYRINGE
INTRAMUSCULAR | Status: DC | PRN
  Administered 2019-08-01: 100 mg via INTRAVENOUS

## 2019-08-01 MED ORDER — ORAL CARE MOUTH RINSE: 15.0000 mL | Freq: Once | OROMUCOSAL | Status: AC

## 2019-08-01 MED ORDER — LIDOCAINE 2% (20 MG/ML) 5 ML SYRINGE
INTRAMUSCULAR | Status: AC
  Filled 2019-08-01: qty 5

## 2019-08-01 MED ORDER — PROPOFOL 10 MG/ML IV BOLUS
INTRAVENOUS | Status: DC | PRN
  Administered 2019-08-01: 200 mg via INTRAVENOUS

## 2019-08-01 MED ORDER — ONDANSETRON HCL 4 MG/2ML IJ SOLN
INTRAMUSCULAR | Status: DC | PRN
  Administered 2019-08-01: 4 mg via INTRAVENOUS

## 2019-08-01 MED ORDER — ROCURONIUM BROMIDE 10 MG/ML (PF) SYRINGE
PREFILLED_SYRINGE | INTRAVENOUS | Status: DC | PRN
  Administered 2019-08-01: 20 mg via INTRAVENOUS
  Administered 2019-08-01: 30 mg via INTRAVENOUS

## 2019-08-01 MED ORDER — FENTANYL CITRATE (PF) 250 MCG/5ML IJ SOLN
INTRAMUSCULAR | Status: AC
  Filled 2019-08-01: qty 5

## 2019-08-01 MED ORDER — ONDANSETRON HCL 4 MG/2ML IJ SOLN
INTRAMUSCULAR | Status: AC
  Filled 2019-08-01: qty 2

## 2019-08-01 MED ORDER — BUPIVACAINE LIPOSOME 1.3 % IJ SUSP
INTRAMUSCULAR | Status: DC | PRN
  Administered 2019-08-01: 10 mL via PERINEURAL

## 2019-08-01 MED ORDER — SCOPOLAMINE 1 MG/3DAYS TD PT72
1.0000 | MEDICATED_PATCH | Freq: Once | TRANSDERMAL | Status: DC
  Administered 2019-08-01: 1.5 mg via TRANSDERMAL
  Filled 2019-08-01: qty 1

## 2019-08-01 MED ORDER — LACTATED RINGERS IV SOLN: INTRAVENOUS | Status: DC

## 2019-08-01 MED ORDER — OXYCODONE HCL 5 MG PO TABS
5.0000 mg | ORAL_TABLET | Freq: Once | ORAL | Status: AC | PRN
  Administered 2019-08-01: 5 mg via ORAL

## 2019-08-01 MED ORDER — SODIUM CHLORIDE (PF) 0.9 % IJ SOLN
INTRAVENOUS | Status: DC | PRN
  Administered 2019-08-01: 5 mL

## 2019-08-01 SURGICAL SUPPLY — 64 items
ADH SKN CLS APL DERMABOND .7 (GAUZE/BANDAGES/DRESSINGS) ×2
APL PRP STRL LF DISP 70% ISPRP (MISCELLANEOUS) ×2
BINDER BREAST LRG (GAUZE/BANDAGES/DRESSINGS) IMPLANT
BINDER BREAST XLRG (GAUZE/BANDAGES/DRESSINGS) ×2 IMPLANT
BNDG COHESIVE 4X5 TAN STRL (GAUZE/BANDAGES/DRESSINGS) ×4 IMPLANT
CANISTER SUCT 3000ML PPV (MISCELLANEOUS) ×4 IMPLANT
CHLORAPREP W/TINT 26 (MISCELLANEOUS) ×4 IMPLANT
CLIP VESOCCLUDE MED 24/CT (CLIP) ×4 IMPLANT
CLIP VESOCCLUDE MED 6/CT (CLIP) ×2 IMPLANT
CLIP VESOCCLUDE MED LG 6/CT (CLIP) ×4 IMPLANT
CLIP VESOCCLUDE SM WIDE 24/CT (CLIP) ×4 IMPLANT
CLOSURE STERI-STRIP 1/4X4 (GAUZE/BANDAGES/DRESSINGS) ×2 IMPLANT
CLOSURE WOUND 1/2 X4 (GAUZE/BANDAGES/DRESSINGS)
CNTNR URN SCR LID CUP LEK RST (MISCELLANEOUS) ×2 IMPLANT
CONT SPEC 4OZ STRL OR WHT (MISCELLANEOUS) ×4
COVER PROBE W GEL 5X96 (DRAPES) ×4 IMPLANT
COVER SURGICAL LIGHT HANDLE (MISCELLANEOUS) ×4 IMPLANT
COVER WAND RF STERILE (DRAPES) ×4 IMPLANT
DECANTER SPIKE VIAL GLASS SM (MISCELLANEOUS) ×4 IMPLANT
DERMABOND ADVANCED (GAUZE/BANDAGES/DRESSINGS) ×2
DERMABOND ADVANCED .7 DNX12 (GAUZE/BANDAGES/DRESSINGS) IMPLANT
DEVICE DUBIN SPECIMEN MAMMOGRA (MISCELLANEOUS) ×2 IMPLANT
DRAPE HALF SHEET 40X57 (DRAPES) ×4 IMPLANT
DRAPE SURG 17X23 STRL (DRAPES) ×2 IMPLANT
DRAPE U-SHAPE 47X51 STRL (DRAPES) ×2 IMPLANT
DRSG PAD ABDOMINAL 8X10 ST (GAUZE/BANDAGES/DRESSINGS) ×4 IMPLANT
ELECT BLADE 4.0 EZ CLEAN MEGAD (MISCELLANEOUS) ×4
ELECT REM PT RETURN 9FT ADLT (ELECTROSURGICAL) ×4
ELECTRODE BLDE 4.0 EZ CLN MEGD (MISCELLANEOUS) IMPLANT
ELECTRODE REM PT RTRN 9FT ADLT (ELECTROSURGICAL) ×2 IMPLANT
GAUZE SPONGE 4X4 12PLY STRL (GAUZE/BANDAGES/DRESSINGS) ×4 IMPLANT
GLOVE BIO SURGEON STRL SZ 6 (GLOVE) ×4 IMPLANT
GLOVE INDICATOR 6.5 STRL GRN (GLOVE) ×4 IMPLANT
GOWN STRL REUS W/ TWL LRG LVL3 (GOWN DISPOSABLE) ×4 IMPLANT
GOWN STRL REUS W/TWL 2XL LVL3 (GOWN DISPOSABLE) ×8 IMPLANT
GOWN STRL REUS W/TWL LRG LVL3 (GOWN DISPOSABLE) ×8
ILLUMINATOR WAVEGUIDE N/F (MISCELLANEOUS) IMPLANT
KIT BASIN OR (CUSTOM PROCEDURE TRAY) ×4 IMPLANT
KIT MARKER MARGIN INK (KITS) ×2 IMPLANT
KIT TURNOVER KIT B (KITS) ×4 IMPLANT
LIGHT WAVEGUIDE WIDE FLAT (MISCELLANEOUS) IMPLANT
NDL 18GX1X1/2 (RX/OR ONLY) (NEEDLE) ×2 IMPLANT
NDL FILTER BLUNT 18X1 1/2 (NEEDLE) IMPLANT
NDL HYPO 25GX1X1/2 BEV (NEEDLE) ×4 IMPLANT
NEEDLE 18GX1X1/2 (RX/OR ONLY) (NEEDLE) ×4 IMPLANT
NEEDLE FILTER BLUNT 18X 1/2SAF (NEEDLE) ×2
NEEDLE FILTER BLUNT 18X1 1/2 (NEEDLE) ×2 IMPLANT
NEEDLE HYPO 25GX1X1/2 BEV (NEEDLE) ×8 IMPLANT
NS IRRIG 1000ML POUR BTL (IV SOLUTION) ×4 IMPLANT
PACK GENERAL/GYN (CUSTOM PROCEDURE TRAY) ×4 IMPLANT
PACK UNIVERSAL I (CUSTOM PROCEDURE TRAY) ×4 IMPLANT
PAD ABD 8X10 STRL (GAUZE/BANDAGES/DRESSINGS) ×2 IMPLANT
PAD ARMBOARD 7.5X6 YLW CONV (MISCELLANEOUS) ×8 IMPLANT
PENCIL SMOKE EVACUATOR (MISCELLANEOUS) ×4 IMPLANT
RETRACTOR ONETRAX LX 90X20 (MISCELLANEOUS) ×2 IMPLANT
STAPLER VISISTAT 35W (STAPLE) ×4 IMPLANT
STOCKINETTE IMPERVIOUS 9X36 MD (GAUZE/BANDAGES/DRESSINGS) ×4 IMPLANT
STRIP CLOSURE SKIN 1/2X4 (GAUZE/BANDAGES/DRESSINGS) ×2 IMPLANT
SUT MNCRL AB 4-0 PS2 18 (SUTURE) ×8 IMPLANT
SUT VIC AB 3-0 SH 27 (SUTURE) ×8
SUT VIC AB 3-0 SH 27X BRD (SUTURE) ×4 IMPLANT
SYR CONTROL 10ML LL (SYRINGE) ×8 IMPLANT
TOWEL GREEN STERILE (TOWEL DISPOSABLE) ×4 IMPLANT
TOWEL GREEN STERILE FF (TOWEL DISPOSABLE) ×4 IMPLANT

## 2019-08-01 NOTE — Discharge Instructions (Addendum)
[facility] °Office Phone Number [phone] ° °BREAST BIOPSY/ PARTIAL MASTECTOMY: POST OP INSTRUCTIONS ° °Always review your discharge instruction sheet given to you by the facility where your surgery was performed. ° °IF YOU HAVE DISABILITY OR FAMILY LEAVE FORMS, YOU MUST BRING THEM TO THE OFFICE FOR PROCESSING.  DO NOT GIVE THEM TO YOUR DOCTOR. ° °1. A prescription for pain medication may be given to you upon discharge.  Take your pain medication as prescribed, if needed.  If narcotic pain medicine is not needed, then you may take acetaminophen (Tylenol) or ibuprofen (Advil) as needed. °2. Take your usually prescribed medications unless otherwise directed °3. If you need a refill on your pain medication, please contact your pharmacy.  They will contact our office to request authorization.  Prescriptions will not be filled after 5pm or on week-ends. °4. You should eat very light the first 24 hours after surgery, such as soup, crackers, pudding, etc.  Resume your normal diet the day after surgery. °5. Most patients will experience some swelling and bruising in the breast.  Ice packs and a good support bra will help.  Swelling and bruising can take several days to resolve.  °6. It is common to experience some constipation if taking pain medication after surgery.  Increasing fluid intake and taking a stool softener will usually help or prevent this problem from occurring.  A mild laxative (Milk of Magnesia or Miralax) should be taken according to package directions if there are no bowel movements after 48 hours. °7. Unless discharge instructions indicate otherwise, you may remove your bandages 48 hours after surgery, and you may shower at that time.  You may have steri-strips (small skin tapes) in place directly over the incision.  These strips should be left on the skin for 7-10 days.   Any sutures or staples will be removed at the office during your follow-up visit. °8. ACTIVITIES:  You may resume  regular daily activities (gradually increasing) beginning the next day.  Wearing a good support bra or sports bra (or the breast binder) minimizes pain and swelling.  You may have sexual intercourse when it is comfortable. °a. You may drive when you no longer are taking prescription pain medication, you can comfortably wear a seatbelt, and you can safely maneuver your car and apply brakes. °b. RETURN TO WORK:  __________1 week_______________ °9. You should see your doctor in the office for a follow-up appointment approximately two weeks after your surgery.  Your doctor’s nurse will typically make your follow-up appointment when she calls you with your pathology report.  Expect your pathology report 2-3 business days after your surgery.  You may call to check if you do not hear from us after three days. ° ° °WHEN TO CALL YOUR DOCTOR: °1. Fever over 101.0 °2. Nausea and/or vomiting. °3. Extreme swelling or bruising. °4. Continued bleeding from incision. °5. Increased pain, redness, or drainage from the incision. ° °The clinic staff is available to answer your questions during regular business hours.  Please don’t hesitate to call and ask to speak to one of the nurses for clinical concerns.  If you have a medical emergency, go to the nearest emergency room or call 911.  A surgeon from [facility] is always on call at the hospital. ° °For further questions, please visit [email or web address]  ° °

## 2019-08-01 NOTE — Anesthesia Procedure Notes (Signed)
Anesthesia Regional Block: Pectoralis block   Pre-Anesthetic Checklist: ,, timeout performed, Correct Patient, Correct Site, Correct Laterality, Correct Procedure, Correct Position, site marked, Risks and benefits discussed, pre-op evaluation,  At surgeon's request and post-op pain management  Laterality: Right  Prep: Maximum Sterile Barrier Precautions used, chloraprep       Needles:  Injection technique: Single-shot  Needle Type: Echogenic Stimulator Needle     Needle Length: 9cm  Needle Gauge: 22     Additional Needles:   Procedures:,,,, ultrasound used (permanent image in chart),,,,  Narrative:  Start time: 08/01/2019 9:33 AM End time: 08/01/2019 9:35 AM Injection made incrementally with aspirations every 5 mL.  Performed by: Personally  Anesthesiologist: Brennan Bailey, MD  Additional Notes: Risks, benefits, and alternative discussed. Patient gave consent for procedure. Patient prepped and draped in sterile fashion. Sedation administered, patient remains easily responsive to voice. Relevant anatomy identified with ultrasound guidance. Local anesthetic given in 5cc increments with no signs or symptoms of intravascular injection. No pain or paraesthesias with injection. Patient monitored throughout procedure with signs of LAST or immediate complications. Tolerated well. Ultrasound image placed in chart.  Tawny Asal, MD

## 2019-08-01 NOTE — Interval H&P Note (Signed)
History and Physical Interval Note:  08/01/2019 9:26 AM  Kristina Rivera  has presented today for surgery, with the diagnosis of RECURRENT BREAST CANCER.  The various methods of treatment have been discussed with the patient and family. After consideration of risks, benefits and other options for treatment, the patient has consented to  Procedure(s): RIGHT BREAST LUMPECTOMY WITH SENTINEL LYMPH NODE BX, BLUE DYE INJECTION (Right) as a surgical intervention.  The patient's history has been reviewed, patient examined, no change in status, stable for surgery.  I have reviewed the patient's chart and labs.  Questions were answered to the patient's satisfaction.     Stark Klein

## 2019-08-01 NOTE — Transfer of Care (Signed)
Immediate Anesthesia Transfer of Care Note  Patient: Kristina Rivera  Procedure(s) Performed: RIGHT BREAST LUMPECTOMY  BLUE DYE INJECTION (Right Breast) Right Sentinel Node Biopsy (Right Axilla)  Patient Location: PACU  Anesthesia Type:GA combined with regional for post-op pain  Level of Consciousness: awake and alert   Airway & Oxygen Therapy: Patient Spontanous Breathing and Patient connected to nasal cannula oxygen  Post-op Assessment: Report given to RN and Post -op Vital signs reviewed and stable  Post vital signs: Reviewed and stable  Last Vitals:  Vitals Value Taken Time  BP 133/78 08/01/19 1200  Temp    Pulse 81 08/01/19 1203  Resp 24 08/01/19 1203  SpO2 97 % 08/01/19 1203  Vitals shown include unvalidated device data.  Last Pain:  Vitals:   08/01/19 0939  PainSc: 0-No pain      Patients Stated Pain Goal: 3 ([id number])  Complications: No apparent anesthesia complications

## 2019-08-01 NOTE — Anesthesia Procedure Notes (Signed)
Procedure Name: Intubation Date/Time: 08/01/2019 10:09 AM Performed by: Imagene Riches, CRNA Pre-anesthesia Checklist: Patient identified, Emergency Drugs available, Suction available and Patient being monitored Patient Re-evaluated:Patient Re-evaluated prior to induction Oxygen Delivery Method: Circle System Utilized Preoxygenation: Pre-oxygenation with 100% oxygen Induction Type: IV induction Ventilation: Mask ventilation without difficulty and Oral airway inserted - appropriate to patient size Laryngoscope Size: Sabra Heck and 2 Grade View: Grade I Tube type: Oral Tube size: 7.0 mm Number of attempts: 1 Airway Equipment and Method: Stylet and Oral airway Placement Confirmation: ETT inserted through vocal cords under direct vision,  positive ETCO2 and breath sounds checked- equal and bilateral Secured at: 22 cm Tube secured with: Tape Dental Injury: Teeth and Oropharynx as per pre-operative assessment

## 2019-08-01 NOTE — Anesthesia Postprocedure Evaluation (Signed)
Anesthesia Post Note  Patient: Kristina Rivera  Procedure(s) Performed: RIGHT BREAST LUMPECTOMY  BLUE DYE INJECTION (Right Breast) Right Sentinel Node Biopsy (Right Axilla)     Patient location during evaluation: PACU Anesthesia Type: General Level of consciousness: awake and alert and oriented Pain management: pain level controlled Vital Signs Assessment: post-procedure vital signs reviewed and stable Respiratory status: spontaneous breathing, nonlabored ventilation and respiratory function stable Cardiovascular status: blood pressure returned to baseline Postop Assessment: no apparent nausea or vomiting Anesthetic complications: no    Last Vitals:  Vitals:   08/01/19 1245 08/01/19 1300  BP: (!) 141/78 (!) 142/76  Pulse: 66 70  Resp: 14 16  Temp:  36.8 C  SpO2: 97% 95%    Last Pain:  Vitals:   08/01/19 1300  PainSc: Jackson E [name]

## 2019-08-01 NOTE — Op Note (Signed)
Right Breast Lumpectomy with Sentinel Node Mapping and Biopsy Procedure Note  Indications: This patient presents with history of recurrent right breast cancer with clinically negative axillary lymph node exam. This is recurrent at around 1 year s/p initial treament.  Pt only received surgery and did not receive adjuvant chemo or radiation.    Pre-operative Diagnosis: right breast cancer, lower outer quadrant, ER-/PR-/Her 2-, cT1N0, prior cancer in similar area with pT2N0. Both are grade 3 invasive ductal carcinoma triple negative  Post-operative Diagnosis: right breast cancer  Surgeon:   , MD  Assistants: Peyton Anderson, RNFA  Anesthesia: general, regional, and local  ASA Class: 3  Procedure Details  The patient was seen in the Holding Room. The risks, benefits, complications, treatment options, and expected outcomes were discussed with the patient. The possibilities of reaction to medication, pulmonary aspiration, bleeding, infection, the need for additional procedures, failure to diagnose a condition, and creating a complication requiring transfusion or operation were discussed with the patient. The patient concurred with the proposed plan, giving informed consent.  The site of surgery properly noted/marked. The patient was taken to Operating Room # 2, identified as Lolah Yvette Cobey and the procedure verified as right Breast Lumpectomy and Sentinel Node Biopsy. A Time Out was held and the above information confirmed. The methylene blue was administered into the subareolar breast tissue   After induction of anesthesia, the right arm, breast, and chest were prepped and draped in standard fashion.  The lumpectomy was performed by creating a circumlinear incision near the mass incorporating the overlying skin.  Skin hooks were used to elevate the skin and the cautery was used to dissect around the mass.  Dissection was carried down to the pectoral fascia.  The specimen was marked  with the margin marker paint kit.  Additional superior and inferior margins were taken. Hemostasis was achieved with cautery.  Large clips were placed on the specimen cavity edges for radiation.   The wound was irrigated and closed with a 3-0 Vicryl deep dermal interrupted and a 4-0 Monocryl subcuticular closure in layers.  Using a hand-held gamma probe, axillary sentinel nodes were identified transcutaneously.  An oblique incision was created below the axillary hairline.  Dissection was carried through the clavipectoral fascia.  2 deep level 2 axillary sentinel nodes were removed. Lymphovascular channels were clipped.  The wound was irrigated.  Hemostasis was achieved with cautery and clips.  The axillary incision was closed with 3-0 vicryl deep dermal interrupted sutures and 4-0 monocryl subcuticular closure in layers.      Sterile dressings were applied. At the end of the operation, all sponge, instrument, and needle counts were correct.  Findings: grossly clear surgical margins and anterior margin is skin, posterior margin is fascia, SLN #1 mildly hot with cps 39; SLN #2 hot wtih cps 240.  Background count 0  Estimated Blood Loss:  Minimal         Specimens: right breast lumpectomy, additional superior margin, additional inferior margin, SLN #1 and SLN #2                Complications:  None; patient tolerated the procedure well.         Disposition: PACU - hemodynamically stable.         Condition: stable      

## 2019-08-02 ENCOUNTER — Encounter: Payer: Medicare HMO | Admitting: Adult Health

## 2019-08-05 LAB — SURGICAL PATHOLOGY

## 2019-08-12 NOTE — Progress Notes (Signed)
Kristina Rivera  [phone] [phone]    ID: Kristina Rivera DOB: 05-08-1960  MR#: [id number]  [id number]#:[id number]  Patient Care Team: Jonathon Jordan, MD as PCP - General (Family Medicine) Kenitha Glendinning, [name], MD as Consulting Physician (Oncology) Stark Klein, MD as Consulting Physician (General Surgery) Jayzen Paver, [name], MD as Consulting Physician (Oncology) Gery Pray, MD as Consulting Physician (Radiation Oncology) Chauncey Cruel, MD OTHER MD:   CHIEF COMPLAINT: Triple negative breast cancer  CURRENT TREATMENT: Letrozole   INTERVAL HISTORY: Kristina Rivera today for follow up of her triple negative breast cancer.  She wants to have started on exemestane at her last visit on 07/01/2019.  However the pharmacy wanted $560 for it and the patient says her insurance would not cover it.  Since her last visit, she underwent right lumpectomy and sentinel lymph node biopsy on 08/01/2019 under Dr. Barry Dienes. Pathology from the procedure ([id number]) showed: invasive ductal carcinoma, grade 3, 1.1 cm; carcinoma is broadly <0.1 cm to anterior margin.  Four lymph nodes were removed, all of which were negative for carcinoma (0/4).    REVIEW OF SYSTEMS: Kristina Rivera she has found a nurse practitioner in [place] whose first name is Iona Beard that she has been working with and he has been helping her with the radiation sensitivity.  She is taking a vitamin D and C she is able to do better in different environments.  She tolerated surgery well, without unusual bleeding, fever, or pain.  A detailed review of systems today was otherwise stable.   HISTORY OF CURRENT ILLNESS: The original intake note:  Kristina Rivera had palpated a lump in her left breast. She then underwent mammography on 02/23/2018 at the [facility] showing a possible abnormality in both the right and left breasts. She underwent bilateral diagnostic mammography with tomography and  bilateral breast ultrasonography on 03/01/2018 showing: 3 cm mass in the 8 o'clock position of the right breast highly suspicious for breast malignancy; Two borderline abnormal right axillary lymph nodes with cortical thicknesses of 4 mm; indeterminate mass or lesion in the 2 o'clock retroareolar region of the left breast measuring 6 mm and no left axillary adenopathy.   Accordingly on 03/09/2018 she proceeded to biopsy of the breast areas in question. The pathology from this procedure ([id number]) showed: high grade carcinoma with necrosis in the right breast at 8 o'clock; benign right axilla lymph node; benign breast parenchyma in the left breast at 2 o'clock retroareolar. Prognostic indicators significant for: estrogen receptor, 0% negative and progesterone receptor, 0% negative. Proliferation marker Ki67 at 80%. HER2 equivocal (2+) by immunohistochemistry, but negative by fluorescent in situ hybridization with an initial analysis result with a signals ratio 1.22 and number per cell 4.4. A repeat analysis was completed showing a signals ratio 1.13 and number per cell 4.43.   She underwent genetic screening on 03/20/2018 with results pending.  The patient's subsequent history is as detailed below.   PAST MEDICAL HISTORY: Past Medical History:  Diagnosis Date  . Anemia   . Anxiety    pt denies  . Arthritis    "back; knees; shoulders" (04/19/2013)  . Asthma    onset as adult  . Cancer [facility])    right breast cancer  . Chronic lower back pain   . Daily headache    "last couple months" (04/19/2013)  . Depression    during a personal crisis  . Family history of breast cancer   . Family history of lung cancer   .  Heart murmur    as a child - no problems  . History of kidney stones   . Palpitations   . Personal history of chemotherapy   . PONV (postoperative nausea and vomiting)    couple of days of nausea after lumpectomy  . Radiation disease    pt states she is very sensitive to  electromagnetic things - makes her BP go very high, also causes Asthma symptoms     PAST SURGICAL HISTORY: Past Surgical History:  Procedure Laterality Date  . BREAST BIOPSY Right 02/2018   malignant  . BREAST BIOPSY Left 02/2018   benign  . BREAST LUMPECTOMY Right 03/2018  . BREAST LUMPECTOMY WITH AXILLARY LYMPH NODE BIOPSY Right 03/28/2018   Procedure: RIGHT BREAST LUMPECTOMY WITH RIGHT SENTINEL LYMPH NODE BIOPSY;  Surgeon: Stark Klein, MD;  Location: [employer] [facility];  Service: General;  Laterality: Right;  . BREAST LUMPECTOMY WITH SENTINEL LYMPH NODE BIOPSY Right 08/01/2019   Procedure: RIGHT BREAST LUMPECTOMY  BLUE DYE INJECTION;  Surgeon: Stark Klein, MD;  Location: [place];  Service: General;  Laterality: Right;  . CARPAL TUNNEL RELEASE Left 1999?  Marland Kitchen PORT-A-CATH REMOVAL Left 10/18/2018   Procedure: REMOVAL PORT-A-CATH;  Surgeon: Stark Klein, MD;  Location: [place];  Service: General;  Laterality: Left;  . PORTACATH PLACEMENT Left 05/14/2018   Procedure: INSERTION PORT-A-CATH;  Surgeon: Stark Klein, MD;  Location: [place];  Service: General;  Laterality: Left;  . ruptured disc     Lumbar  . SENTINEL NODE BIOPSY Right 08/01/2019   Procedure: Right Sentinel Node Biopsy;  Surgeon: Stark Klein, MD;  Location: [place];  Service: General;  Laterality: Right;  . TUBAL LIGATION  1993    FAMILY HISTORY: Family History  Problem Relation Age of Onset  . Breast cancer Maternal Aunt 37  . Pneumonia Mother   . Lung cancer Mother 18  . Stroke Father   . Head & neck cancer Sister 30       neck  . Prostate cancer Neg Hx    She underwent commercial genetic testing and was told she is 25% of European Jewish ancestry. Patient father was 16 years old when he died from stroke. Patient mother died from pneumonia at age 77.  The patient notes a family hx of breast cancer in a maternal aunt diagnosed with breast cancer at age 11.  The patient has 9 siblings, 4 brothers and 5 sisters.  1 sister was diagnosed with  neck cancer at age 25, and patient's mom with lung cancer at age 10.   GYNECOLOGIC HISTORY:  No LMP recorded. Patient is postmenopausal. Menarche: 59 years old Age at first live birth: 59 years old [place] P 5 LMP around age 32 Contraceptive: never HRT: no  Hysterectomy? no So? no   SOCIAL HISTORY:  She is on disability due to a back injury. Prior to that, she was a Quarry manager at [employer]. Her husband, Mariea Clonts, is retired. He was a Animal nutritionist. They have 5 children. Pascal Lux, age 70, is a Community education officer in [place], [place]. Orvis Brill, age 70, is a Education officer, museum here in [place]. Wendy Poet, age 85, is a school Engineer, water in [place], [place]. Silverio Decamp., age 53, is a Customer service manager here in [place]. [place], age 46, is a nanny in [place], [place]. She and her husband have two grandchildren, and her husband has 7 grandchildren and 7 great-grandchildren from 2 children from a prior marriage. They attend the [employer].  They tell Rivera their religious affiliation does not involve any medical restrictions  ADVANCED DIRECTIVES: The patient and her husband are each others healthcare powers of attorney   HEALTH MAINTENANCE: Social History   Tobacco Use  . Smoking status: Never Smoker  . Smokeless tobacco: Never Used  Substance Use Topics  . Alcohol use: No  . Drug use: No     Colonoscopy: 2010, Dr. Vella Kohler?  PAP: 07/2016  Bone density: more than 3 years ago   Allergies  Allergen Reactions  . Aspirin Anaphylaxis and Hives  . Nsaids Anaphylaxis  . Other Anaphylaxis    MUSHROOMS    Current Outpatient Medications  Medication Sig Dispense Refill  . acetaminophen (TYLENOL) 650 MG CR tablet Take 650 mg by mouth every 8 (eight) hours as needed for pain.     Marland Kitchen albuterol (VENTOLIN HFA) 108 (90 BASE) MCG/ACT inhaler Inhale 2 puffs into the lungs as needed. (Patient taking differently: Inhale 2 puffs  into the lungs every 6 (six) hours as needed for wheezing or shortness of breath. ) 1 Inhaler 6  . fluticasone (FLONASE) 50 MCG/ACT nasal spray Place 1 spray into both nostrils daily as needed for allergies or rhinitis.    Marland Kitchen letrozole (FEMARA) 2.5 MG tablet Take 1 tablet (2.5 mg total) by mouth daily. 90 tablet 4  . Multiple Vitamins-Minerals (EMERGEN-C IMMUNE PLUS) PACK Take 1 Package by mouth daily.    Marland Kitchen OVER THE COUNTER MEDICATION Take 1 capsule by mouth daily. Chaga Mushroom    . OVER THE COUNTER MEDICATION Take 1 capsule by mouth daily. Gano Mushroom     No current facility-administered medications for this visit.    OBJECTIVE: African-American woman in no acute distress  Vitals:   08/13/19 1301  BP: (!) 144/89  Pulse: 74  Resp: 18  Temp: 98.5 F (36.9 C)  SpO2: 98%    Body mass index is 58.4 kg/m.   Wt Readings from Last 3 Encounters:  08/13/19 (!) 319 lb 4.8 oz (144.8 kg)  08/01/19 (!) 317 lb (143.8 kg)  07/29/19 (!) 317 lb 6.4 oz (144 kg)   Sclerae unicteric, EOMs intact Wearing a mask No cervical or supraclavicular adenopathy Lungs no rales or rhonchi Heart regular rate and rhythm Abd soft, nontender, positive bowel sounds MSK no focal spinal tenderness, no upper extremity lymphedema Neuro: nonfocal, well oriented, appropriate affect Breasts: The right breast is status post recent lumpectomy.  The cosmetic result is very good.  The incisions are healing nicely, although the one in the axilla has minimal pink, consistent with mild inflammation.    It is not suggestive of infection. Left breast is benign. Both axillae are benign.   LAB RESULTS:  CMP     Component Value Date/Time   NA 142 08/13/2019 1229   K 4.5 08/13/2019 1229   CL 102 08/13/2019 1229   CO2 29 08/13/2019 1229   GLUCOSE 134 (H) 08/13/2019 1229   BUN 9 08/13/2019 1229   CREATININE 0.82 08/13/2019 1229   CREATININE 0.83 06/26/2018 1401   CREATININE 0.72 11/23/2012 1616   CALCIUM 9.7 08/13/2019  1229   PROT 7.8 08/13/2019 1229   ALBUMIN 3.3 (L) 08/13/2019 1229   AST 25 08/13/2019 1229   AST 25 06/26/2018 1401   ALT 19 08/13/2019 1229   ALT 19 06/26/2018 1401   ALKPHOS 69 08/13/2019 1229   BILITOT 0.4 08/13/2019 1229   BILITOT 0.4 06/26/2018 1401   GFRNONAA >60 08/13/2019  1229   GFRNONAA >60 06/26/2018 1401   GFRAA >60 08/13/2019 1229   GFRAA >60 06/26/2018 1401    No results found for: TOTALPROTELP, ALBUMINELP, A1GS, A2GS, BETS, BETA2SER, GAMS, MSPIKE, SPEI  No results found for: KPAFRELGTCHN, LAMBDASER, KAPLAMBRATIO  Lab Results  Component Value Date   WBC 9.1 08/13/2019   NEUTROABS 4.5 08/13/2019   HGB 12.3 08/13/2019   HCT 41.5 08/13/2019   MCV 73.7 (L) 08/13/2019   PLT 359 08/13/2019   No results found for: LABCA2  No components found for: FTDDUK025  No results for input(s): INR in the last 168 hours.  No results found for: LABCA2  No results found for: KYH062  No results found for: BJS283  No results found for: TDV761  No results found for: CA2729  No components found for: HGQUANT  No results found for: CEA1 / No results found for: CEA1   No results found for: AFPTUMOR  No results found for: CHROMOGRNA  No results found for: HGBA, HGBA2QUANT, HGBFQUANT, HGBSQUAN (Hemoglobinopathy evaluation)   Lab Results  Component Value Date   LDH 356 (H) 03/16/2019    No results found for: IRON, TIBC, IRONPCTSAT (Iron and TIBC)  Lab Results  Component Value Date   FERRITIN 250 03/16/2019    Urinalysis    Component Value Date/Time   COLORURINE AMBER (A) 03/16/2019 1741   APPEARANCEUR HAZY (A) 03/16/2019 1741   LABSPEC 1.025 03/16/2019 1741   PHURINE 5.0 03/16/2019 1741   GLUCOSEU NEGATIVE 03/16/2019 1741   HGBUR NEGATIVE 03/16/2019 1741   BILIRUBINUR NEGATIVE 03/16/2019 [place] 03/16/2019 1741   PROTEINUR 30 (A) 03/16/2019 1741   NITRITE NEGATIVE 03/16/2019 1741   LEUKOCYTESUR NEGATIVE 03/16/2019 1741     STUDIES: NM Sentinel Node Inj-No Rpt (Breast)  Result Date: 08/01/2019 Sulfur colloid was injected by the nuclear medicine technologist for melanoma sentinel node.     ELIGIBLE FOR AVAILABLE RESEARCH PROTOCOL: no   ASSESSMENT: 59 y.o. [place] woman status post right breast lower outer quadrant biopsy 03/09/2018 for a clinical T2N0, stage IIB invasive ductal carcinoma, triple negative, with an MIB-1 of 80%  (1) Genetic testing performed through [facility] Common Hereditary Cancers Panel + EGFR on 03/21/2018 showed no pathogenic mutations in APC, ATM, AXIN2, BARD1, BMPR1A, BRCA1, BRCA2, BRIP1, BUB1B, CDH1, CDK4, CDKN2A, CHEK2, CTNNA1, DICER1, ENG, EPCAM, GALNT12, GREM1, HOXB13, KIT, MEN1, MLH1, MLH3, MSH2, MSH3, MSH6, MUTYH, NBN, NF1, NTHL1, PALB2, PDGFRA, PMS2, POLD1, POLE, PTEN, RAD50, RAD51C, RAD51D, RNF43, RPS20, SDHA, SDHB, SDHC, SDHD, SMAD4, SMARCA4, STK11, TP53, TSC1, TSC2, VHL.   (a) A variant of uncertain significance (VUS) in a gene called ATM was also noted. c.4082A>G (p.[id number]).  (2) status post right lumpectomy and sentinel lymph node sampling 03/28/2018 for a pT2 pN0, stage IIB invasive ductal carcinoma, with medullary features, negative margins.  (3) adjuvant chemotherapy consisting of cyclophosphamide, methotrexate and fluorouracil given every 21 days started 05/16/2018, discontinued after 2 cycles by patient (last dose 06/26/2018)  (3) patient opted against adjuvant radiation   (4) likely thalassemia (ferritin 250 on 03/16/2019 with MCV 73.5 and Hb 13.6)  LOCAL RECURRENCE RIGHT BREAST: (5) biopsy of a new clinically 0.5 cm right breast mass biopsied 05/20/2019 shows invasive ductal carcinoma, grade 3, estrogen receptor positive, HER-2 and progesterone receptor negative  (a) chest CT 06/21/2019 shows a slightly enlarged right axillary lymph node and a hypervascular lesion in the left liver measuring 1.7 cm; there were no worrisome lung or bone lesions  (b) bone  scan 06/25/2019  shows possible spinal lesions which however may well be degenerative changes; consideration of total spinal MRI  (c) breast MRI  (d) liver MRI  (6) anastrozole started 05/23/2019, discontinued after approximately 1 month with intolerable side effects  (b) exemestane started 07/01/2019  (7) status post right lumpectomy and axillary lymph node sampling 2019-08-01 for a pT1c pN0 invasive ductal carcinoma, grade 3, with negative margins  (a) additional 4 axillary nodes removed (5 nodes removed with 03/28/2018 surgery)   PLAN: Kristina Rivera did very well with her surgery.  I reassured her that I do not see infection in the axilla and she has follow-up with Dr. Barry Dienes 08/16/2019.  She is feeling much better regarding the radiation sensitivity issue now that she is in contact with several groups internationally and is being helped by a nurse practitioner with some interest in this concern from [place].  We discussed the fact that her margins were close although negative.  The standard of care at this point is adjuvant radiation.  She is willing to consider it.  I have requested an appointment with Dr. Randa Ngo to discuss that further.  She was not able to obtain exemestane for cost issues.  I am starting her on letrozole.  I have asked her to call us if she cannot tolerate this in which case I would consider fulvestrant.  Otherwise I will see her again in 3 months  She knows to call for any other issue that may develop before that visit  Total encounter time 30 minutes.*  Kristina Rivera, [name], MD  08/13/19 1:40 PM Medical Oncology and Hematology [facility] [place], [place] [address] Tel. [phone]    Fax. [phone]   I, Wilburn Mylar, am acting as scribe for Dr. [name]. Kristina Rivera.  I, Lurline Del MD, have reviewed the above documentation for accuracy and completeness, and I agree with the above.   *Total Encounter Time as defined by the  Centers for Medicare and Medicaid Services includes, in addition to the face-to-face time of a patient visit (documented in the note above) non-face-to-face time: obtaining and reviewing outside history, ordering and reviewing medications, tests or procedures, care coordination (communications with other health care professionals or caregivers) and documentation in the medical record.

## 2019-08-13 ENCOUNTER — Inpatient Hospital Stay: Payer: Medicare HMO | Attending: Oncology | Admitting: Oncology

## 2019-08-13 ENCOUNTER — Other Ambulatory Visit: Payer: Self-pay

## 2019-08-13 ENCOUNTER — Inpatient Hospital Stay: Payer: Medicare HMO

## 2019-08-13 VITALS — BP 144/89 | HR 74 | Temp 98.5°F | Resp 18 | Ht 62.0 in | Wt 319.3 lb

## 2019-08-13 DIAGNOSIS — Z79811 Long term (current) use of aromatase inhibitors: Secondary | ICD-10-CM | POA: Diagnosis not present

## 2019-08-13 DIAGNOSIS — C50911 Malignant neoplasm of unspecified site of right female breast: Secondary | ICD-10-CM

## 2019-08-13 DIAGNOSIS — Z6841 Body Mass Index (BMI) 40.0 and over, adult: Secondary | ICD-10-CM

## 2019-08-13 DIAGNOSIS — Z171 Estrogen receptor negative status [ER-]: Secondary | ICD-10-CM | POA: Insufficient documentation

## 2019-08-13 DIAGNOSIS — W888XXS Exposure to other ionizing radiation, sequela: Secondary | ICD-10-CM

## 2019-08-13 DIAGNOSIS — C50511 Malignant neoplasm of lower-outer quadrant of right female breast: Secondary | ICD-10-CM | POA: Insufficient documentation

## 2019-08-13 LAB — CBC WITH DIFFERENTIAL/PLATELET
Abs Immature Granulocytes: 0.05 10*3/uL (ref 0.00–0.07)
Basophils Absolute: 0.1 10*3/uL (ref 0.0–0.1)
Basophils Relative: 1 %
Eosinophils Absolute: 0.3 10*3/uL (ref 0.0–0.5)
Eosinophils Relative: 4 %
HCT: 41.5 % (ref 36.0–46.0)
Hemoglobin: 12.3 g/dL (ref 12.0–15.0)
Immature Granulocytes: 1 %
Lymphocytes Relative: 39 %
Lymphs Abs: 3.6 10*3/uL (ref 0.7–4.0)
MCH: 21.8 pg — ABNORMAL LOW (ref 26.0–34.0)
MCHC: 29.6 g/dL — ABNORMAL LOW (ref 30.0–36.0)
MCV: 73.7 fL — ABNORMAL LOW (ref 80.0–100.0)
Monocytes Absolute: 0.6 10*3/uL (ref 0.1–1.0)
Monocytes Relative: 7 %
Neutro Abs: 4.5 10*3/uL (ref 1.7–7.7)
Neutrophils Relative %: 48 %
Platelets: 359 10*3/uL (ref 150–400)
RBC: 5.63 MIL/uL — ABNORMAL HIGH (ref 3.87–5.11)
RDW: 15.6 % — ABNORMAL HIGH (ref 11.5–15.5)
WBC: 9.1 10*3/uL (ref 4.0–10.5)
nRBC: 0 % (ref 0.0–0.2)

## 2019-08-13 LAB — COMPREHENSIVE METABOLIC PANEL
ALT: 19 U/L (ref 0–44)
AST: 25 U/L (ref 15–41)
Albumin: 3.3 g/dL — ABNORMAL LOW (ref 3.5–5.0)
Alkaline Phosphatase: 69 U/L (ref 38–126)
Anion gap: 11 (ref 5–15)
BUN: 9 mg/dL (ref 6–20)
CO2: 29 mmol/L (ref 22–32)
Calcium: 9.7 mg/dL (ref 8.9–10.3)
Chloride: 102 mmol/L (ref 98–111)
Creatinine, Ser: 0.82 mg/dL (ref 0.44–1.00)
GFR calc Af Amer: >60 mL/min (ref >60)
GFR calc non Af Amer: >60 mL/min (ref >60)
Glucose, Bld: 134 mg/dL — ABNORMAL HIGH (ref 70–99)
Potassium: 4.5 mmol/L (ref 3.5–5.1)
Sodium: 142 mmol/L (ref 135–145)
Total Bilirubin: 0.4 mg/dL (ref 0.3–1.2)
Total Protein: 7.8 g/dL (ref 6.5–8.1)

## 2019-08-13 MED ORDER — LETROZOLE 2.5 MG PO TABS
2.5000 mg | ORAL_TABLET | Freq: Every day | ORAL | 4 refills | Status: AC
Start: 2019-08-13 — End: ?

## 2019-08-14 ENCOUNTER — Telehealth: Payer: Self-pay | Admitting: Oncology

## 2019-08-14 NOTE — Telephone Encounter (Signed)
Scheduled appts per 6/1 los. Pt confirmed appt date and time.

## 2019-08-15 ENCOUNTER — Other Ambulatory Visit: Payer: Self-pay | Admitting: *Deleted

## 2019-08-15 DIAGNOSIS — C50911 Malignant neoplasm of unspecified site of right female breast: Secondary | ICD-10-CM

## 2019-08-16 ENCOUNTER — Telehealth: Payer: Self-pay

## 2019-08-16 NOTE — Telephone Encounter (Signed)
Patient called Dr. Clabe Seal office as she was trying to return someone's call from the [place] named  Donnetta Simpers, Patient advised no Donnetta Simpers works in this clinic but will ask and find out who she is so she can obtain an appt. with Dr. Sondra Come.

## 2019-08-17 DIAGNOSIS — U071 COVID-19: Secondary | ICD-10-CM | POA: Diagnosis not present

## 2019-08-20 ENCOUNTER — Encounter: Payer: Self-pay | Admitting: *Deleted

## 2019-09-09 DIAGNOSIS — Z0100 Encounter for examination of eyes and vision without abnormal findings: Secondary | ICD-10-CM | POA: Diagnosis not present

## 2019-09-09 DIAGNOSIS — H524 Presbyopia: Secondary | ICD-10-CM | POA: Diagnosis not present

## 2019-09-13 NOTE — Progress Notes (Signed)
**Note Kristina-Identified via Obfuscation** Patient here for a f/u new consult with Dr. Sondra Come.   Signed Cosign: Cosign Not Required Encounter Date: 08/13/2019  Editor: Kristina Hakim Kristina Dad, MD (Physician)      Prior Versions: 1. Kristina Rivera at 08/13/2019 5:56 AM - Incomplete   [facility] [name]  [phone] [phone]    ID: Kristina Rivera DOB: [birth year]  MR#: [id number]  [id number]#:[id number]  Patient Care Team: Kristina Jordan, MD as PCP - General (Family Medicine) Kristina Rivera, Kristina Dad, MD as Consulting Physician (Oncology) Kristina Klein, MD as Consulting Physician (General Surgery) Kristina Rivera, Kristina Dad, MD as Consulting Physician (Oncology) Kristina Pray, MD as Consulting Physician (Radiation Oncology) Kristina Cruel, MD OTHER MD:   CHIEF COMPLAINT: Triple negative breast cancer  CURRENT TREATMENT: Letrozole   INTERVAL HISTORY: Kristina Rivera returns today for follow up of her triple negative breast cancer.  She wants to have started on exemestane at her last visit on 07/01/2019.  However the pharmacy wanted $560 for it and the patient says her insurance would not cover it.  Since her last visit, she underwent right lumpectomy and sentinel lymph node biopsy on 08/01/2019 under Dr. Barry Rivera. Pathology from the procedure ([id number]) showed: invasive ductal carcinoma, grade 3, 1.1 cm; carcinoma is broadly <0.1 cm to anterior margin.  Four lymph nodes were removed, all of which were negative for carcinoma (0/4).    REVIEW OF SYSTEMS: [name] tells me she has found a nurse practitioner in [place] whose first name is Kristina Rivera that she has been working with and he has been helping her with the radiation sensitivity.  She is taking a vitamin D and C she is able to do better in different environments.  She tolerated surgery well, without unusual bleeding, fever, or pain.  A detailed review of systems today was otherwise stable.   HISTORY OF CURRENT  ILLNESS: The original intake note:  Kristina Rivera had palpated a lump in her left breast. She then underwent mammography on 02/23/2018 at the [facility] showing a possible abnormality in both the right and left breasts. She underwent bilateral diagnostic mammography with tomography and bilateral breast ultrasonography on 03/01/2018 showing: 3 cm mass in the 8 o'clock position of the right breast highly suspicious for breast malignancy; Two borderline abnormal right axillary lymph nodes with cortical thicknesses of 4 mm; indeterminate mass or lesion in the 2 o'clock retroareolar region of the left breast measuring 6 mm and no left axillary adenopathy.   Accordingly on 03/09/2018 she proceeded to biopsy of the breast areas in question. The pathology from this procedure ([id number]) showed: high grade carcinoma with necrosis in the right breast at 8 o'clock; benign right axilla lymph node; benign breast parenchyma in the left breast at 2 o'clock retroareolar. Prognostic indicators significant for: estrogen receptor, 0% negative and progesterone receptor, 0% negative. Proliferation marker Ki67 at 80%. HER2 equivocal (2+) by immunohistochemistry, but negative by fluorescent in situ hybridization with an initial analysis result with a signals ratio 1.22 and number per cell 4.4. A repeat analysis was completed showing a signals ratio 1.13 and number per cell 4.43.   She underwent genetic screening on 03/20/2018 with results pending.  The patient's subsequent history is as detailed below.   PAST MEDICAL HISTORY:     Past Medical History:  Diagnosis Date  . Anemia   . Anxiety    pt denies  . Arthritis    "back; knees; shoulders" (04/19/2013)  . Asthma  onset as adult  . Cancer [facility])    right breast cancer  . Chronic lower back pain   . Daily headache    "last couple months" (04/19/2013)  . Depression    during a personal crisis  . Family history of breast cancer   . Family  history of lung cancer   . Heart murmur    as a child - no problems  . History of kidney stones   . Palpitations   . Personal history of chemotherapy   . PONV (postoperative nausea and vomiting)    couple of days of nausea after lumpectomy  . Radiation disease    pt states she is very sensitive to electromagnetic things - makes her BP go very high, also causes Asthma symptoms          Past/Anticipated interventions by medical oncology, if any: Chemotherapy: no  Lymphedema issues, if any: no  Pain issues, if any:  Intermittent chest pain from radiation disease  SAFETY ISSUES:  Prior radiation? no  Pacemaker/ICD? no  Possible current pregnancy? postmenopausal  Is the patient on methotrexate? no  Current Complaints / other details: Concerned if she can tolerate radiation.  BP (!) 194/84 (BP Location: Left Arm)   Pulse 89   Resp (!) 22   Ht '5\' 2"'$  (1.575 m)   Wt (!) 310 lb (140.6 kg)   SpO2 99%   BMI 56.70 kg/m    Wt Readings from Last 3 Encounters:  09/18/19 (!) 310 lb (140.6 kg)  08/13/19 (!) 319 lb 4.8 oz (144.8 kg)  08/01/19 (!) 317 lb (143.8 kg)       Kristina Burrs, RN 09/13/2019,12:24 PM

## 2019-09-16 DIAGNOSIS — U071 COVID-19: Secondary | ICD-10-CM | POA: Diagnosis not present

## 2019-09-18 ENCOUNTER — Encounter: Payer: Self-pay | Admitting: Radiation Oncology

## 2019-09-18 ENCOUNTER — Other Ambulatory Visit: Payer: Self-pay

## 2019-09-18 ENCOUNTER — Encounter: Payer: Self-pay | Admitting: Licensed Clinical Social Worker

## 2019-09-18 ENCOUNTER — Ambulatory Visit
Admission: RE | Admit: 2019-09-18 | Discharge: 2019-09-18 | Disposition: A | Discharge status: Home / Self Care | Payer: Medicare HMO | Source: Ambulatory Visit | Attending: Radiation Oncology | Admitting: Radiation Oncology

## 2019-09-18 DIAGNOSIS — Z171 Estrogen receptor negative status [ER-]: Secondary | ICD-10-CM

## 2019-09-18 DIAGNOSIS — Z170 Estrogen receptor positive status [ER+]: Secondary | ICD-10-CM | POA: Diagnosis not present

## 2019-09-18 DIAGNOSIS — M170 Bilateral primary osteoarthritis of knee: Secondary | ICD-10-CM | POA: Insufficient documentation

## 2019-09-18 DIAGNOSIS — C50511 Malignant neoplasm of lower-outer quadrant of right female breast: Secondary | ICD-10-CM | POA: Diagnosis not present

## 2019-09-18 DIAGNOSIS — Z9889 Other specified postprocedural states: Secondary | ICD-10-CM | POA: Diagnosis not present

## 2019-09-18 DIAGNOSIS — Z79899 Other long term (current) drug therapy: Secondary | ICD-10-CM | POA: Insufficient documentation

## 2019-09-18 NOTE — Progress Notes (Addendum)
Radiation Oncology         [phone] ________________________________  Name: Kristina Rivera MRN: [id number]  Date: 09/18/2019  DOB: 1960-10-26  Re-Evaluation Note  CC: Jonathon Jordan, MD  Magrinat, [name], MD    ICD-10-CM   1. Malignant neoplasm of lower-outer quadrant of right breast of female, estrogen receptor negative ([place])  C50.511    Z17.1     Diagnosis: Stage pT1c, pN0 Right Breast LOQ Invasive Ductal Carcinoma, ER+ / PR- / Her2-, Grade 3  Stage IIB (pT2, pN0) Right Breast LOQ Invasive Ductal Carcinoma, ER- / PR- / Her2-, Grade 3 diagnosed in December of 2019  Narrative:  The patient returns today to discuss radiation treatment options. She was seen in the multidisciplinary breast clinic on 03/21/2018. At that time, it was recommended that she proceed with genetic testing, MRI, chemotherapy (neoadjuvant vs adjuvant), lumpectomy with sentinel lymph node biopsy, adjuvant radiation therapy, and an echocardiogram.  Genetic testing on 03/21/2018 was negative with no pathogenic mutations identified.  The patient underwent a right breast lumpectomy and sentinel lymph node biopsy on 03/28/2018 performed by Dr. Barry Dienes. Pathology from the procedure revealed grade 3 invasive carcinoma with medullary features of the right breast. The carcinoma was 0.5 cm from the anterior and lateral margins. There was no lymphovascular or perineural invasion. Five lymph nodes were biopsied and all were negative for carcinoma.  She underwent an echocardiogram on 04/04/2018 that showed an EF of 55-60%.  She was to be treated with adjuvant chemotherapy consisting of Cyclophosphamide and Doxorubicin in dose dense fashion x4 followed by weekly Carboplatin and Paclitaxel x12. Treatment began on 05/16/2018 under the care of Dr. Jana Hakim.She called and cancelled her second cycle of chemotherapy because of COVID-19 concerns. She was encouraged to resume treatment on 06/26/2018, which she did. However, she  then called on the morning of 07/18/2018 and stated that she no longer wished to continue with chemotherapy secondary to radiation sensitivity. She saw Dr. Jana Hakim later that day but was firm in her decision to discontinue chemotherapy. They discussed adjuvant radiation, which the patient was very much against pursuing at that time.  Of note, she was scheduled for consultation with Dr. Valaria Good, medical oncologist, at [facility] on 09/11/2018 regarding information on electromagnetic sensitivity. However, she got lost and frustrated, so she cancelled the appointment.  She was seen by Dr. Jana Hakim in follow-up on 11/01/2018, during which time she was not a candidate for further chemotherapy or radiation given how far out she was from surgery. She was placed under observation.  She was seen in the ED on 03/16/2019 with complaints of cough, body-aches, chills, and diarrhea. Chest x-ray at that time showed cardiomegaly without acute abnormality of the lungs. Due to persistently low oxygen saturations and tachypnea, the was admitted to the hospital for further evaluation and management. She was treated for acute hypoxic respiratory failure secondary to COVID-19, was stabilized, and was discharged home on 03/19/2019.  Bilateral diagnostic mammogram with right breast ultrasound on 04/09/2019 showed an indeterminate 0.5 cm mass within the lower outer right breast, just inferior and medial to the lumpectomy scar. Tissue sampling was recommended. There were also noted to be some right lumpectomy changes. There were no abnormal right axillary lymph nodes and there was no mammographic evidence of left breast malignancy.   Biopsy on 05/20/2019 revealed grade 3 invasive ductal carcinoma of the lower outer right breast. Prognostic indicators were significant for estrogen receptor 70% positive with a weak staining intensity and progesterone  receptor 0% negative. Proliferation marker Ki67 was 60%. Her2  negative.  She was seen in follow-up with Dr. Magrinat on 05/23/2019, during which time there was concern for local recurrence given the mammogram findings above. In light of this, Dr. Magrinat recommended proceeding with a chest CT scan, bone scan, and breast MRI. She was also started on Anastrozole, however she was not able to tolerate it.  Restaging chest CT scan on 06/21/2019 showed a 12 mm short axis right axillar lymph node that was suspicious for metastatic involvement. It also showed a hypervascular lesion in the lateral segment of the left liver that measured 1.7 cm. Finally, there was noted to be cholelithiasis, hepatic steatosis, and enlargement of the pulmonary outflow tract and main pulmonary arteries, suggesting pulmonary artery hypertension.  Bone scan on 06/25/2019 showed focal areas of increased tracer activity within the superior aspect of the sternum and along multiple levels of the thoracic and lumbar spine, possibly representing metastatic lesions. However, superimposed degenerative changes throughout the thoracolumbar spine could not be excluded. It also showed focal uptake within the proximal left food, degenerative vs post-traumatic in origin. Finally, there were degenerative changes seen within the bilateral shoulder and bilateral knees.  A pelvic ultrasound was performed on 06/25/2019 for postmenopausal bleeding. Results showed a subserosal uterine fibroid of the right aspect of the posterior uterine fundus that measured 3.7 cm. There were no findings that explained the postmenopausal bleeding and there was no ultrasound abnormality of the endometrium.  She was seen by Dr. Magrinat on 07/01/2019, during which time she was prescribed Exemestane but was not able to pick it up secondary to cost. Thus, she was later started on Letrozole. They discussed surgical intervention with a mastectomy since she did not wish to pursue radiation therapy. However, she preferred a  lumpectomy.  She underwent a right breast lumpectomy with sentinel node biopsy on 08/01/2019 performed by Dr. Byerly. Pathology from the procedure revealed grade 3 invasive ductal carcinoma that was broadly <0.1 cm of the anterior margin. The right inferior margin showed benign breast tissue and the right superior margin showed fibrocystic change and usual ductal hyperplasia with sclerosing adenosis and calcifications. Four lymph nodes were biopsied and all were negative for carcinoma.  She was last seen by Dr. Magrinat on 08/13/2019. At that time, she was noted to be doing very well since surgery and was feeling better regarding the radiation sensitivity. She is in contact with several groups internationally and is being helped by a nurse practitioner from [place]. Given that her margins were negative but close, she agreed to discuss adjuvant radiation therapy.  On review of systems, the patient reports intermittent chest pain from radiation sensitivity. She denies lymphedema and any other symptoms.    Allergies:  is allergic to aspirin, nsaids, and other.  Meds: Current Outpatient Medications  Medication Sig Dispense Refill  . acetaminophen (TYLENOL) 650 MG CR tablet Take 650 mg by mouth every 8 (eight) hours as needed for pain.     . albuterol (VENTOLIN HFA) 108 (90 BASE) MCG/ACT inhaler Inhale 2 puffs into the lungs as needed. (Patient taking differently: Inhale 2 puffs into the lungs every 6 (six) hours as needed for wheezing or shortness of breath. ) 1 Inhaler 6  . fluticasone (FLONASE) 50 MCG/ACT nasal spray Place 1 spray into both nostrils daily as needed for allergies or rhinitis.    . Multiple Vitamins-Minerals (EMERGEN-C IMMUNE PLUS) PACK Take 1 Package by mouth daily.    . letrozole (FEMARA) 2.5   MG tablet Take 1 tablet (2.5 mg total) by mouth daily. (Patient not taking: Reported on 09/18/2019) 90 tablet 4  . OVER THE COUNTER MEDICATION Take 1 capsule by mouth daily. Chaga Mushroom  (Patient not taking: Reported on 09/18/2019)    . OVER THE COUNTER MEDICATION Take 1 capsule by mouth daily. Gano Mushroom (Patient not taking: Reported on 09/18/2019)     No current facility-administered medications for this encounter.    Physical Findings: The patient is in no acute distress. Patient is alert and oriented.  height is 5' 2" (1.575 m) and weight is 310 lb (140.6 kg) (abnormal). Her blood pressure is 194/84 (abnormal) and her pulse is 89. Her respiration is 22 (abnormal) and oxygen saturation is 99%.  No significant changes. Lungs are clear to auscultation bilaterally. Heart has regular rate and rhythm. No palpable cervical, supraclavicular, or axillary adenopathy. Abdomen soft, non-tender, normal bowel sounds. Left breast: No palpable mass, nipple discharge or bleeding. Right breast: Faint scar in the lower outer quadrant from her prior lumpectomy.  Patient has a new scar in the same general region from her second lumpectomy which is healed well without signs of drainage or infection.  No nipple discharge or bleeding.  No palpable lymphadenopathy.  Separate scar and axillary region from her sentinel node procedure.  Lab Findings: Lab Results  Component Value Date   WBC 9.1 08/13/2019   HGB 12.3 08/13/2019   HCT 41.5 08/13/2019   MCV 73.7 (L) 08/13/2019   PLT 359 08/13/2019    Radiographic Findings: No results found.  Impression: Stage pT1c, pN0 Right Breast LOQ Invasive Ductal Carcinoma, ER+ / PR- / Her2-, Grade 3  Given that the patient has developed a recurrence or a new primary in the same general vicinity as her previous breast cancer short interval, the patient is now willing to proceed with adjuvant radiation therapy.  I discussed the general course of treatment side effects and potential toxicities of radiation therapy in the situation with the patient and her husband.  She appears to understand and wishes to proceed with planned course of treatment.  Plan:  Patient  is scheduled for CT simulation later today.  She will start treatment next week.  We will attempt to perform hypofractionated accelerated radiation therapy if technically possible.  Total time spent in this encounter was 35 minutes which included reviewing the patient's most recent follow-ups, chemotherapy, imaging (mammogram, ultrasounds, CT scan, bone scan), ED visit/hospitalization, surgeries, pathology reports, physical examination, and documentation.  -----------------------------------  Blair Promise, PhD, MD  This document serves as a record of services personally performed by Gery Pray, MD. It was created on his behalf by Clerance Lav, a trained medical scribe. The creation of this record is based on the scribe's personal observations and the provider's statements to them. This document has been checked and approved by the attending provider.

## 2019-09-18 NOTE — Progress Notes (Addendum)
[place] CSW Progress Note  [employer] representative met with patient to gather additional bills to fax to [employer] in [place] for payment. Patient will be starting radiation today and will continue to check in with CSW regarding bills to be sent.  The patient scored a 5 on the Psychosocial Distress Thermometer which indicates moderate distress. Clinical social worker will continue to work with patient to offer support and address needs.   ONCBCN DISTRESS SCREENING 09/18/2019  Screening Type   Distress experienced in past week (1-10) 5  Family Problem type   Emotional problem type   Physical Problem type   Referral to support programs   Other Pt. is working with Sharyn Lull, Education officer, museum here.     Edwinna Areola Osiah Haring , LCSW

## 2019-09-19 ENCOUNTER — Encounter: Payer: Self-pay | Admitting: *Deleted

## 2019-09-23 ENCOUNTER — Other Ambulatory Visit: Payer: Self-pay | Admitting: Radiation Oncology

## 2019-09-23 DIAGNOSIS — C50511 Malignant neoplasm of lower-outer quadrant of right female breast: Secondary | ICD-10-CM

## 2019-09-23 DIAGNOSIS — N63 Unspecified lump in unspecified breast: Secondary | ICD-10-CM

## 2019-09-24 ENCOUNTER — Telehealth: Payer: Self-pay | Admitting: *Deleted

## 2019-09-24 NOTE — Telephone Encounter (Signed)
CALLED PATIENT TO INFORM OF MAMMOGRAM AND [place] FOR 09-25-19 - ARRIVAL TIME- 10:15 AM @ [facility] , PATIENT TO BRING ID, MASK AND INSURANCE CARD, SPOKE WITH PATIENT AND SHE VERIFIED UNDERSTANDING THESE APPTS.

## 2019-09-25 ENCOUNTER — Ambulatory Visit
Admission: RE | Admit: 2019-09-25 | Discharge: 2019-09-25 | Disposition: A | Discharge status: Home / Self Care | Payer: Medicare HMO | Source: Ambulatory Visit | Attending: Radiation Oncology | Admitting: Radiation Oncology

## 2019-09-25 ENCOUNTER — Telehealth: Payer: Self-pay | Admitting: Emergency Medicine

## 2019-09-25 ENCOUNTER — Other Ambulatory Visit: Payer: Self-pay

## 2019-09-25 DIAGNOSIS — Z171 Estrogen receptor negative status [ER-]: Secondary | ICD-10-CM

## 2019-09-25 DIAGNOSIS — N6489 Other specified disorders of breast: Secondary | ICD-10-CM | POA: Diagnosis not present

## 2019-09-25 DIAGNOSIS — N63 Unspecified lump in unspecified breast: Secondary | ICD-10-CM

## 2019-09-25 DIAGNOSIS — R928 Other abnormal and inconclusive findings on diagnostic imaging of breast: Secondary | ICD-10-CM | POA: Diagnosis not present

## 2019-09-25 NOTE — Telephone Encounter (Signed)
Received call on triage line requesting CT sim study results from 09/18/19 to be put in powershare.  Provided information for Tammi RN with MD Magrinat's office who states she will speak with imaging to get studies put into computer.

## 2019-09-26 ENCOUNTER — Ambulatory Visit: Payer: Medicare HMO

## 2019-09-27 ENCOUNTER — Ambulatory Visit: Payer: Medicare HMO

## 2019-09-30 ENCOUNTER — Ambulatory Visit: Payer: Medicare HMO

## 2019-09-30 DIAGNOSIS — C50511 Malignant neoplasm of lower-outer quadrant of right female breast: Secondary | ICD-10-CM | POA: Diagnosis not present

## 2019-10-01 ENCOUNTER — Ambulatory Visit: Payer: Medicare HMO

## 2019-10-02 ENCOUNTER — Ambulatory Visit
Admission: RE | Admit: 2019-10-02 | Discharge: 2019-10-02 | Disposition: A | Discharge status: Home / Self Care | Payer: Medicare HMO | Source: Ambulatory Visit | Attending: Radiation Oncology | Admitting: Radiation Oncology

## 2019-10-03 ENCOUNTER — Ambulatory Visit: Payer: Medicare HMO

## 2019-10-04 ENCOUNTER — Ambulatory Visit: Payer: Medicare HMO

## 2019-10-04 DIAGNOSIS — C50911 Malignant neoplasm of unspecified site of right female breast: Secondary | ICD-10-CM | POA: Diagnosis not present

## 2019-10-07 ENCOUNTER — Ambulatory Visit: Payer: Medicare HMO

## 2019-10-08 ENCOUNTER — Ambulatory Visit: Payer: Medicare HMO

## 2019-10-09 ENCOUNTER — Ambulatory Visit: Payer: Medicare HMO

## 2019-10-10 ENCOUNTER — Ambulatory Visit: Payer: Medicare HMO

## 2019-10-11 ENCOUNTER — Ambulatory Visit: Payer: Medicare HMO

## 2019-10-14 ENCOUNTER — Ambulatory Visit: Payer: Medicare HMO

## 2019-10-15 ENCOUNTER — Ambulatory Visit: Payer: Medicare HMO

## 2019-10-16 ENCOUNTER — Ambulatory Visit: Payer: Medicare HMO

## 2019-10-17 ENCOUNTER — Ambulatory Visit: Payer: Medicare HMO

## 2019-10-17 ENCOUNTER — Encounter: Payer: Self-pay | Admitting: Radiation Oncology

## 2019-10-17 DIAGNOSIS — U071 COVID-19: Secondary | ICD-10-CM | POA: Diagnosis not present

## 2019-10-17 NOTE — Progress Notes (Signed)
Progress note  Patient proceeded to undergo CT simulation.  On this examination a suspicious  Lymph node in the right axillary region was noted which was noted on her diagnostic CT scan.  She underwent extensive evaluation of the right axilla there is no suspicious lymph nodes noted.  Patient underwent planning for radiation therapy.  She however called prior to starting her treatments reporting that she had decided not to proceed with recommended radiation therapy.  She felt that she would not tolerate this well given her extreme sensitivity to electromagnetic radiation.  Patient understands that she would be at increased risk for recurrence without radiation therapy but appears to be willing to accept this risk.  She will continue close follow-up with medical oncology and surgery.  -----------------------------------  Blair Promise, PhD, MD

## 2019-10-18 ENCOUNTER — Ambulatory Visit: Payer: Medicare HMO

## 2019-10-21 ENCOUNTER — Ambulatory Visit: Payer: Medicare HMO

## 2019-10-22 ENCOUNTER — Ambulatory Visit: Payer: Medicare HMO

## 2019-10-22 ENCOUNTER — Encounter: Payer: Self-pay | Admitting: *Deleted

## 2019-10-22 ENCOUNTER — Ambulatory Visit: Payer: Medicare HMO | Admitting: Radiation Oncology

## 2019-10-23 ENCOUNTER — Ambulatory Visit: Payer: Medicare HMO | Admitting: Radiation Oncology

## 2019-10-23 ENCOUNTER — Other Ambulatory Visit: Payer: Self-pay

## 2019-10-23 ENCOUNTER — Ambulatory Visit: Payer: Medicare HMO

## 2019-10-23 NOTE — Patient Outreach (Signed)
[facility]) Care Management  10/23/2019  Shakiya Mcneary 01-23-1961 [id number]   Telephone Screen  Referral Date: 10/23/2019 Referral Source: "EMMI-Prevent-no response from letter" Insurance: [facility]   Outreach attempt # 1 to patient. Spoke with patient who reports she is doing fairly well all things considered. She goes to see PCP on 11/06/19 and oncologist on 11/21/19. She has contacted MD office already and waiting hear back about being seen sooner. She reports that one of her meds is causing her not to rest well and sleep good at night. Patient has breast cancer and has elected not to undergo radiation or chemo treatments at this time. She reports she trying a more holistic/natural approach and has plant based diet. She has supportive spouse in the home who takes he to appts., She denies any issues managing and/or affording meds at this time. Patient appreciative of call but voices she does not have any needs or concerns at this time. She is agreeable to contacting RN CM back in the future if needs arise and contact info provided.      Plan: RN CM will close case at this time.    Enzo Montgomery, RN,BSN,CCM [facility] Telephonic Care Management Coordinator Direct Phone: [phone] Toll Free: [phone] Fax: [phone]

## 2019-10-24 ENCOUNTER — Ambulatory Visit: Payer: Medicare HMO

## 2019-10-25 ENCOUNTER — Ambulatory Visit: Payer: Medicare HMO

## 2019-10-28 ENCOUNTER — Ambulatory Visit: Payer: Medicare HMO

## 2019-10-29 ENCOUNTER — Ambulatory Visit: Payer: Medicare HMO

## 2019-10-30 ENCOUNTER — Ambulatory Visit: Payer: Medicare HMO

## 2019-10-31 ENCOUNTER — Ambulatory Visit: Payer: Medicare HMO

## 2019-11-01 ENCOUNTER — Ambulatory Visit: Payer: Medicare HMO

## 2019-11-02 ENCOUNTER — Other Ambulatory Visit: Payer: Self-pay | Admitting: Oncology

## 2019-11-04 ENCOUNTER — Ambulatory Visit: Payer: Medicare HMO

## 2019-11-04 ENCOUNTER — Telehealth: Payer: Self-pay | Admitting: Oncology

## 2019-11-04 NOTE — Telephone Encounter (Signed)
Rescheduled apt per 8/21 sch msg - pt is aware of appt date and time

## 2019-11-05 ENCOUNTER — Ambulatory Visit: Payer: Medicare HMO

## 2019-11-05 ENCOUNTER — Ambulatory Visit: Payer: Medicare HMO | Admitting: Radiation Oncology

## 2019-11-06 ENCOUNTER — Ambulatory Visit: Payer: Medicare HMO

## 2019-11-06 DIAGNOSIS — J4520 Mild intermittent asthma, uncomplicated: Secondary | ICD-10-CM | POA: Diagnosis not present

## 2019-11-06 DIAGNOSIS — Z79899 Other long term (current) drug therapy: Secondary | ICD-10-CM | POA: Diagnosis not present

## 2019-11-06 DIAGNOSIS — E1169 Type 2 diabetes mellitus with other specified complication: Secondary | ICD-10-CM | POA: Diagnosis not present

## 2019-11-06 DIAGNOSIS — Z1159 Encounter for screening for other viral diseases: Secondary | ICD-10-CM | POA: Diagnosis not present

## 2019-11-06 DIAGNOSIS — E559 Vitamin D deficiency, unspecified: Secondary | ICD-10-CM | POA: Diagnosis not present

## 2019-11-06 DIAGNOSIS — E119 Type 2 diabetes mellitus without complications: Secondary | ICD-10-CM | POA: Diagnosis not present

## 2019-11-06 DIAGNOSIS — Z0000 Encounter for general adult medical examination without abnormal findings: Secondary | ICD-10-CM | POA: Diagnosis not present

## 2019-11-06 DIAGNOSIS — J309 Allergic rhinitis, unspecified: Secondary | ICD-10-CM | POA: Diagnosis not present

## 2019-11-06 DIAGNOSIS — Z853 Personal history of malignant neoplasm of breast: Secondary | ICD-10-CM | POA: Diagnosis not present

## 2019-11-07 ENCOUNTER — Ambulatory Visit: Payer: Medicare HMO

## 2019-11-08 ENCOUNTER — Ambulatory Visit: Payer: Medicare HMO | Admitting: Radiation Oncology

## 2019-11-08 ENCOUNTER — Ambulatory Visit: Payer: Medicare HMO

## 2019-11-09 ENCOUNTER — Ambulatory Visit: Payer: Medicare HMO

## 2019-11-11 ENCOUNTER — Ambulatory Visit: Payer: Medicare HMO

## 2019-11-11 ENCOUNTER — Ambulatory Visit: Payer: Medicare HMO | Admitting: Radiation Oncology

## 2019-11-12 ENCOUNTER — Ambulatory Visit: Payer: Medicare HMO

## 2019-11-12 ENCOUNTER — Telehealth: Payer: Self-pay | Admitting: Licensed Clinical Social Worker

## 2019-11-12 NOTE — Telephone Encounter (Signed)
[place] CSW Progress Note  Hol[employer] representative received call from patient that she has another bill she needs help submitting to [facility] in [place]. Patient tried to e-mail copy to CSW but it was never received. CSW called patient to notify that e-mail did not come through and asking to re-send or bring in in person.  [place] will then be submitted to [facility] in [place] via fax at [place] , LCSW

## 2019-11-13 ENCOUNTER — Ambulatory Visit: Payer: Medicare HMO

## 2019-11-13 ENCOUNTER — Encounter: Payer: Self-pay | Admitting: Licensed Clinical Social Worker

## 2019-11-13 NOTE — Progress Notes (Signed)
[place] CSW Progress Note  Patient's husband brought in additional bill from 10/29/2019 to be submitted to [facility] in [place]. CSW faxed to Vicente Males with [employer] today.    Edwinna Areola Ranyia Witting , LCSW

## 2019-11-14 ENCOUNTER — Ambulatory Visit: Payer: Medicare HMO

## 2019-11-15 ENCOUNTER — Ambulatory Visit: Payer: Medicare HMO

## 2019-11-17 DIAGNOSIS — U071 COVID-19: Secondary | ICD-10-CM | POA: Diagnosis not present

## 2019-11-18 ENCOUNTER — Ambulatory Visit: Payer: Medicare HMO

## 2019-11-19 ENCOUNTER — Ambulatory Visit: Payer: Medicare HMO

## 2019-11-21 ENCOUNTER — Other Ambulatory Visit: Payer: Medicare HMO

## 2019-11-21 ENCOUNTER — Ambulatory Visit: Payer: Medicare HMO | Admitting: Oncology

## 2019-11-29 ENCOUNTER — Inpatient Hospital Stay (HOSPITAL_BASED_OUTPATIENT_CLINIC_OR_DEPARTMENT_OTHER): Payer: Medicare HMO | Admitting: Oncology

## 2019-11-29 ENCOUNTER — Inpatient Hospital Stay: Payer: Medicare HMO | Attending: Oncology

## 2019-11-29 ENCOUNTER — Encounter: Payer: Self-pay | Admitting: Oncology

## 2019-11-29 DIAGNOSIS — C50911 Malignant neoplasm of unspecified site of right female breast: Secondary | ICD-10-CM

## 2019-11-29 NOTE — Progress Notes (Signed)
[name]  [phone] [phone]    ID: Kristina Rivera DOB: March 08, 1960  MR#: [id number]  HQI#:[id number]  Patient Care Team: Jonathon Jordan, MD as PCP - General (Family Medicine) Yardley Lekas, [name], MD as Consulting Physician (Oncology) Stark Klein, MD as Consulting Physician (General Surgery) Brailyn Killion, [name], MD as Consulting Physician (Oncology) Gery Pray, MD as Consulting Physician (Radiation Oncology) Chauncey Cruel, MD OTHER MD:   CHIEF COMPLAINT: Triple negative breast cancer  CURRENT TREATMENT: Letrozole   INTERVAL HISTORY: [name] was scheduled today for follow up of her triple negative breast cancer, however she did not show.  She was referred back to Dr. Sondra Come on 09/18/2019 for consideration of radiation therapy. She proceeded to CT simulation that day, which revealed a suspicious right axillary lymph node. She underwent right diagnostic mammography and right breast ultrasonography at [facility] on 09/25/2019 showing: breast density category B; no evidence of new or recurrent breast carcinoma; no suspicious right axillary mass or enlarged or abnormal lymph node.  Despite her negative imaging, she decided not to proceed with recommended radiation therapy. Pet note from Dr. Sondra Come on 10/17/2019, she felt that she would not tolerate this well given her extreme sensitivity to electromagnetic radiation.   REVIEW OF SYSTEMS: [place]    HISTORY OF CURRENT ILLNESS: The original intake note:  [name] had palpated a lump in her left breast. She then underwent mammography on 02/23/2018 at the [facility] showing a possible abnormality in both the right and left breasts. She underwent bilateral diagnostic mammography with tomography and bilateral breast ultrasonography on 03/01/2018 showing: 3 cm mass in the 8 o'clock position of the right breast highly suspicious for breast malignancy; Two borderline abnormal right  axillary lymph nodes with cortical thicknesses of 4 mm; indeterminate mass or lesion in the 2 o'clock retroareolar region of the left breast measuring 6 mm and no left axillary adenopathy.   Accordingly on 03/09/2018 she proceeded to biopsy of the breast areas in question. The pathology from this procedure ([id number]) showed: high grade carcinoma with necrosis in the right breast at 8 o'clock; benign right axilla lymph node; benign breast parenchyma in the left breast at 2 o'clock retroareolar. Prognostic indicators significant for: estrogen receptor, 0% negative and progesterone receptor, 0% negative. Proliferation marker Ki67 at 80%. HER2 equivocal (2+) by immunohistochemistry, but negative by fluorescent in situ hybridization with an initial analysis result with a signals ratio 1.22 and number per cell 4.4. A repeat analysis was completed showing a signals ratio 1.13 and number per cell 4.43.   She underwent genetic screening on 03/20/2018 with results pending.  The patient's subsequent history is as detailed below.   PAST MEDICAL HISTORY: Past Medical History:  Diagnosis Date  . Anemia   . Anxiety    pt denies  . Arthritis    "back; knees; shoulders" (04/19/2013)  . Asthma    onset as adult  . Cancer [facility])    right breast cancer  . Chronic lower back pain   . Daily headache    "last couple months" (04/19/2013)  . Depression    during a personal crisis  . Family history of breast cancer   . Family history of lung cancer   . Heart murmur    as a child - no problems  . History of kidney stones   . Palpitations   . Personal history of chemotherapy   . PONV (postoperative nausea and vomiting)    couple of  days of nausea after lumpectomy  . Radiation disease    pt states she is very sensitive to electromagnetic things - makes her BP go very high, also causes Asthma symptoms     PAST SURGICAL HISTORY: Past Surgical History:  Procedure Laterality Date  . BREAST BIOPSY Right 02/2018     malignant  . BREAST BIOPSY Left 02/2018   benign  . BREAST LUMPECTOMY Right 03/2018  . BREAST LUMPECTOMY WITH AXILLARY LYMPH NODE BIOPSY Right 03/28/2018   Procedure: RIGHT BREAST LUMPECTOMY WITH RIGHT SENTINEL LYMPH NODE BIOPSY;  Surgeon: Stark Klein, MD;  Location: [facility];  Service: General;  Laterality: Right;  . BREAST LUMPECTOMY WITH SENTINEL LYMPH NODE BIOPSY Right 08/01/2019   Procedure: RIGHT BREAST LUMPECTOMY  BLUE DYE INJECTION;  Surgeon: Stark Klein, MD;  Location: [place];  Service: General;  Laterality: Right;  . CARPAL TUNNEL RELEASE Left 1999?  Marland Kitchen PORT-A-CATH REMOVAL Left 10/18/2018   Procedure: REMOVAL PORT-A-CATH;  Surgeon: Stark Klein, MD;  Location: [facility] [place];  Service: General;  Laterality: Left;  . PORTACATH PLACEMENT Left 05/14/2018   Procedure: INSERTION PORT-A-CATH;  Surgeon: Stark Klein, MD;  Location: [place];  Service: General;  Laterality: Left;  . ruptured disc     Lumbar  . SENTINEL NODE BIOPSY Right 08/01/2019   Procedure: Right Sentinel Node Biopsy;  Surgeon: Stark Klein, MD;  Location: [place];  Service: General;  Laterality: Right;  . TUBAL LIGATION  1993    FAMILY HISTORY: Family History  Problem Relation Age of Onset  . Breast cancer Maternal Aunt 37  . Pneumonia Mother   . Lung cancer Mother 9  . Stroke Father   . Head & neck cancer Sister 30       neck  . Prostate cancer Neg Hx    She underwent commercial genetic testing and was told she is 25% of European Jewish ancestry. Patient father was 98 years old when he died from stroke. Patient mother died from pneumonia at age 60.  The patient notes a family hx of breast cancer in a maternal aunt diagnosed with breast cancer at age 60.  The patient has 9 siblings, 4 brothers and 5 sisters.  1 sister was diagnosed with neck cancer at age 60, and patient's mom with lung cancer at age 60   GYNECOLOGIC HISTORY:  No LMP recorded. Patient is postmenopausal. Menarche: 59 years old Age at first live birth: 59  years old [place] P 5 LMP around age 59 Contraceptive: never HRT: no  Hysterectomy? no So? no   SOCIAL HISTORY:  She is on disability due to a back injury. Prior to that, she was a Quarry manager at [employer]. Her husband, Mariea Clonts, is retired. He was a Animal nutritionist. They have 5 children. Pascal Lux, age 13, is a Community education officer in [place], [place]. Orvis Brill, age 33, is a Education officer, museum here in [place]. Wendy Poet, age 36, is a school Engineer, water in [place], [place]. Silverio Decamp., age 56, is a Customer service manager here in [place]. [place], age 28, is a nanny in [place], [place]. She and her husband have two grandchildren, and her husband has 7 grandchildren and 7 great-grandchildren from 2 children from a prior marriage. They attend the [employer].  They tell me their religious affiliation does not involve any medical restrictions  ADVANCED DIRECTIVES: The patient and her husband are each others healthcare powers of attorney   HEALTH MAINTENANCE: Social History  Tobacco Use  . Smoking status: Never Smoker  . Smokeless tobacco: Never Used  Vaping Use  . Vaping Use: Never used  Substance Use Topics  . Alcohol use: No  . Drug use: No     Colonoscopy: 2010, Dr. Vella Kohler?  PAP: 07/2016  Bone density: more than 3 years ago   Allergies  Allergen Reactions  . Aspirin Anaphylaxis and Hives  . Nsaids Anaphylaxis  . Other Anaphylaxis    MUSHROOMS    Current Outpatient Medications  Medication Sig Dispense Refill  . acetaminophen (TYLENOL) 650 MG CR tablet Take 650 mg by mouth every 8 (eight) hours as needed for pain.     Marland Kitchen albuterol (VENTOLIN HFA) 108 (90 BASE) MCG/ACT inhaler Inhale 2 puffs into the lungs as needed. (Patient taking differently: Inhale 2 puffs into the lungs every 6 (six) hours as needed for wheezing or shortness of breath. ) 1 Inhaler 6  . fluticasone (FLONASE) 50 MCG/ACT nasal spray Place 1  spray into both nostrils daily as needed for allergies or rhinitis.    Marland Kitchen letrozole (FEMARA) 2.5 MG tablet Take 1 tablet (2.5 mg total) by mouth daily. (Patient not taking: Reported on 09/18/2019) 90 tablet 4  . Multiple Vitamins-Minerals (EMERGEN-C IMMUNE PLUS) PACK Take 1 Package by mouth daily.    Marland Kitchen OVER THE COUNTER MEDICATION Take 1 capsule by mouth daily. Chaga Mushroom (Patient not taking: Reported on 09/18/2019)    . OVER THE COUNTER MEDICATION Take 1 capsule by mouth daily. Gano Mushroom (Patient not taking: Reported on 09/18/2019)     No current facility-administered medications for this visit.    OBJECTIVE:   There were no vitals filed for this visit.  There is no height or weight on file to calculate BMI.   Wt Readings from Last 3 Encounters:  09/18/19 (!) 310 lb (140.6 kg)  08/13/19 (!) 319 lb 4.8 oz (144.8 kg)  08/01/19 (!) 317 lb (143.8 kg)     LAB RESULTS:  CMP     Component Value Date/Time   NA 142 08/13/2019 1229   K 4.5 08/13/2019 1229   CL 102 08/13/2019 1229   CO2 29 08/13/2019 1229   GLUCOSE 134 (H) 08/13/2019 1229   BUN 9 08/13/2019 1229   CREATININE 0.82 08/13/2019 1229   CREATININE 0.83 06/26/2018 1401   CREATININE 0.72 11/23/2012 1616   CALCIUM 9.7 08/13/2019 1229   PROT 7.8 08/13/2019 1229   ALBUMIN 3.3 (L) 08/13/2019 1229   AST 25 08/13/2019 1229   AST 25 06/26/2018 1401   ALT 19 08/13/2019 1229   ALT 19 06/26/2018 1401   ALKPHOS 69 08/13/2019 1229   BILITOT 0.4 08/13/2019 1229   BILITOT 0.4 06/26/2018 1401   GFRNONAA >60 08/13/2019 1229   GFRNONAA >60 06/26/2018 1401   GFRAA >60 08/13/2019 1229   GFRAA >60 06/26/2018 1401    No results found for: TOTALPROTELP, ALBUMINELP, A1GS, A2GS, BETS, BETA2SER, GAMS, MSPIKE, SPEI  No results found for: KPAFRELGTCHN, LAMBDASER, KAPLAMBRATIO  Lab Results  Component Value Date   WBC 9.1 08/13/2019   NEUTROABS 4.5 08/13/2019   HGB 12.3 08/13/2019   HCT 41.5 08/13/2019   MCV 73.7 (L) 08/13/2019   PLT  359 08/13/2019   No results found for: LABCA2  No components found for: [id number]  No results for input(s): INR in the last 168 hours.  No results found for: LABCA2  No results found for: GBT517  No results found for: OHY073  No results found for: XTG626  No results found for: CA2729  No components found for: HGQUANT  No results found for: CEA1 / No results found for: CEA1   No results found for: AFPTUMOR  No results found for: CHROMOGRNA  No results found for: HGBA, HGBA2QUANT, HGBFQUANT, HGBSQUAN (Hemoglobinopathy evaluation)   Lab Results  Component Value Date   LDH 356 (H) 03/16/2019    No results found for: IRON, TIBC, IRONPCTSAT (Iron and TIBC)  Lab Results  Component Value Date   FERRITIN 250 03/16/2019    Urinalysis    Component Value Date/Time   COLORURINE AMBER (A) 03/16/2019 1741   APPEARANCEUR HAZY (A) 03/16/2019 1741   LABSPEC 1.025 03/16/2019 1741   PHURINE 5.0 03/16/2019 1741   GLUCOSEU NEGATIVE 03/16/2019 1741   HGBUR NEGATIVE 03/16/2019 1741   BILIRUBINUR NEGATIVE 03/16/2019 1741   KETONESUR NEGATIVE 03/16/2019 1741   PROTEINUR 30 (A) 03/16/2019 1741   NITRITE NEGATIVE 03/16/2019 1741   LEUKOCYTESUR NEGATIVE 03/16/2019 1741    STUDIES: No results found.   ELIGIBLE FOR AVAILABLE RESEARCH PROTOCOL: no   ASSESSMENT: 59 y.o. [place] woman status post right breast lower outer quadrant biopsy 03/09/2018 for a clinical T2N0, stage IIB invasive ductal carcinoma, triple negative, with an MIB-1 of 80%  (1) Genetic testing performed through [facility] Common Hereditary Cancers Panel + EGFR on 03/21/2018 showed no pathogenic mutations in APC, ATM, AXIN2, BARD1, BMPR1A, BRCA1, BRCA2, BRIP1, BUB1B, CDH1, CDK4, CDKN2A, CHEK2, CTNNA1, DICER1, ENG, EPCAM, GALNT12, GREM1, HOXB13, KIT, MEN1, MLH1, MLH3, MSH2, MSH3, MSH6, MUTYH, NBN, NF1, NTHL1, PALB2, PDGFRA, PMS2, POLD1, POLE, PTEN, RAD50, RAD51C, RAD51D, RNF43, RPS20, SDHA, SDHB, SDHC, SDHD, SMAD4,  SMARCA4, STK11, TP53, TSC1, TSC2, VHL.   (a) A variant of uncertain significance (VUS) in a gene called ATM was also noted. c.[id number]>G (p.[id number]).  (2) status post right lumpectomy and sentinel lymph node sampling 03/28/2018 for a pT2 pN0, stage IIB invasive ductal carcinoma, with medullary features, negative margins.  (3) adjuvant chemotherapy consisting of cyclophosphamide, methotrexate and fluorouracil given every 21 days started 05/16/2018, discontinued after 2 cycles by patient (last dose 06/26/2018)  (3) patient opted against adjuvant radiation   (4) likely thalassemia (ferritin 250 on 03/16/2019 with MCV 73.5 and Hb 13.6)  LOCAL RECURRENCE RIGHT BREAST: (5) biopsy of a new clinically 0.5 cm right breast mass biopsied 05/20/2019 shows invasive ductal carcinoma, grade 3, estrogen receptor positive, HER-2 and progesterone receptor negative  (a) chest CT 06/21/2019 shows a slightly enlarged right axillary lymph node and a hypervascular lesion in the left liver measuring 1.7 cm; there were no worrisome lung or bone lesions  (b) bone scan 06/25/2019 shows possible spinal lesions which however may well be degenerative changes; consideration of total spinal MRI  (c) breast MRI  (d) liver MRI  (6) anastrozole started 05/23/2019, discontinued after approximately 1 month with intolerable side effects  (b) exemestane started 07/01/2019  (7) status post right lumpectomy and axillary lymph node sampling 2019-08-01 for a pT1c pN0 invasive ductal carcinoma, grade 3, with negative margins  (a) additional 4 axillary nodes removed (5 nodes removed with 03/28/2018 surgery)   PLAN: Roza did not show for her 11/29/2018 letter.  A follow-up letter has been sent.   Camauri Craton, [name], MD  11/29/19 8:41 PM Medical Oncology and Hematology [facility] [address], [place] [address] Tel. [phone]    Fax. [phone]   I, Wilburn Mylar, am acting as scribe for Dr.  [name]. Ivan Lacher.  Lindie Spruce MD, have reviewed the  above documentation for accuracy and completeness, and I agree with the above.   *Total Encounter Time as defined by the Centers for Medicare and Medicaid Services includes, in addition to the face-to-face time of a patient visit (documented in the note above) non-face-to-face time: obtaining and reviewing outside history, ordering and reviewing medications, tests or procedures, care coordination (communications with other health care professionals or caregivers) and documentation in the medical record.

## 2019-12-17 DIAGNOSIS — U071 COVID-19: Secondary | ICD-10-CM | POA: Diagnosis not present

## 2020-01-17 DIAGNOSIS — U071 COVID-19: Secondary | ICD-10-CM | POA: Diagnosis not present

## 2020-02-16 DIAGNOSIS — U071 COVID-19: Secondary | ICD-10-CM | POA: Diagnosis not present

## 2020-03-18 DIAGNOSIS — U071 COVID-19: Secondary | ICD-10-CM | POA: Diagnosis not present

## 2020-03-18 DIAGNOSIS — Z171 Estrogen receptor negative status [ER-]: Secondary | ICD-10-CM | POA: Diagnosis not present

## 2020-03-18 DIAGNOSIS — C50511 Malignant neoplasm of lower-outer quadrant of right female breast: Secondary | ICD-10-CM | POA: Diagnosis not present

## 2020-03-19 ENCOUNTER — Other Ambulatory Visit: Payer: Self-pay | Admitting: Oncology

## 2020-03-19 DIAGNOSIS — Z9889 Other specified postprocedural states: Secondary | ICD-10-CM

## 2020-04-25 IMAGING — MG MM CLIP PLACEMENT
2 series · 2 of 2 positions shown · non-contrast
Comparison: Previous exam(s).

CLINICAL DATA: Status post ultrasound-guided core needle biopsy of
a 3 cm mass in the 8 o'clock position of the right breast, right
axillary lymph node with mild cortical thickening and 6 mm mass in
the 2 o'clock retroareolar left breast.

EXAM:
DIAGNOSTIC BILATERAL MAMMOGRAM POST ULTRASOUND BIOPSY X 3

[L CC]
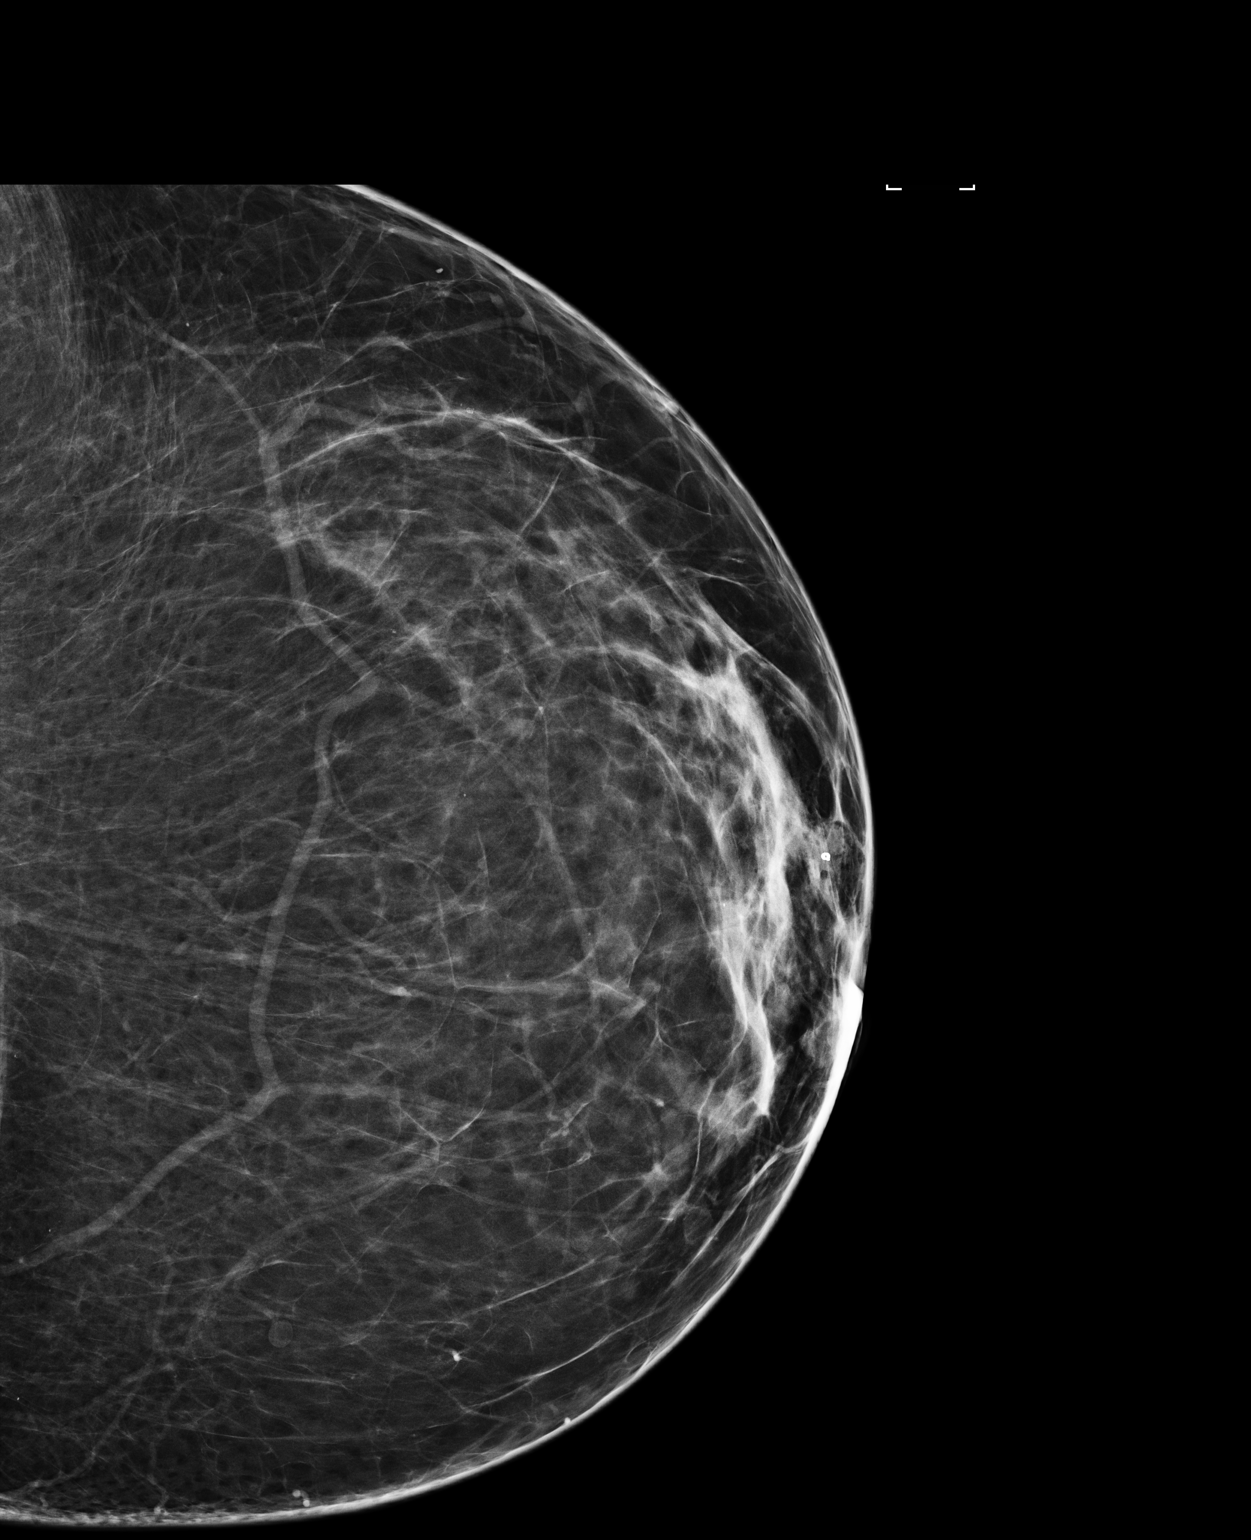

[L ML]
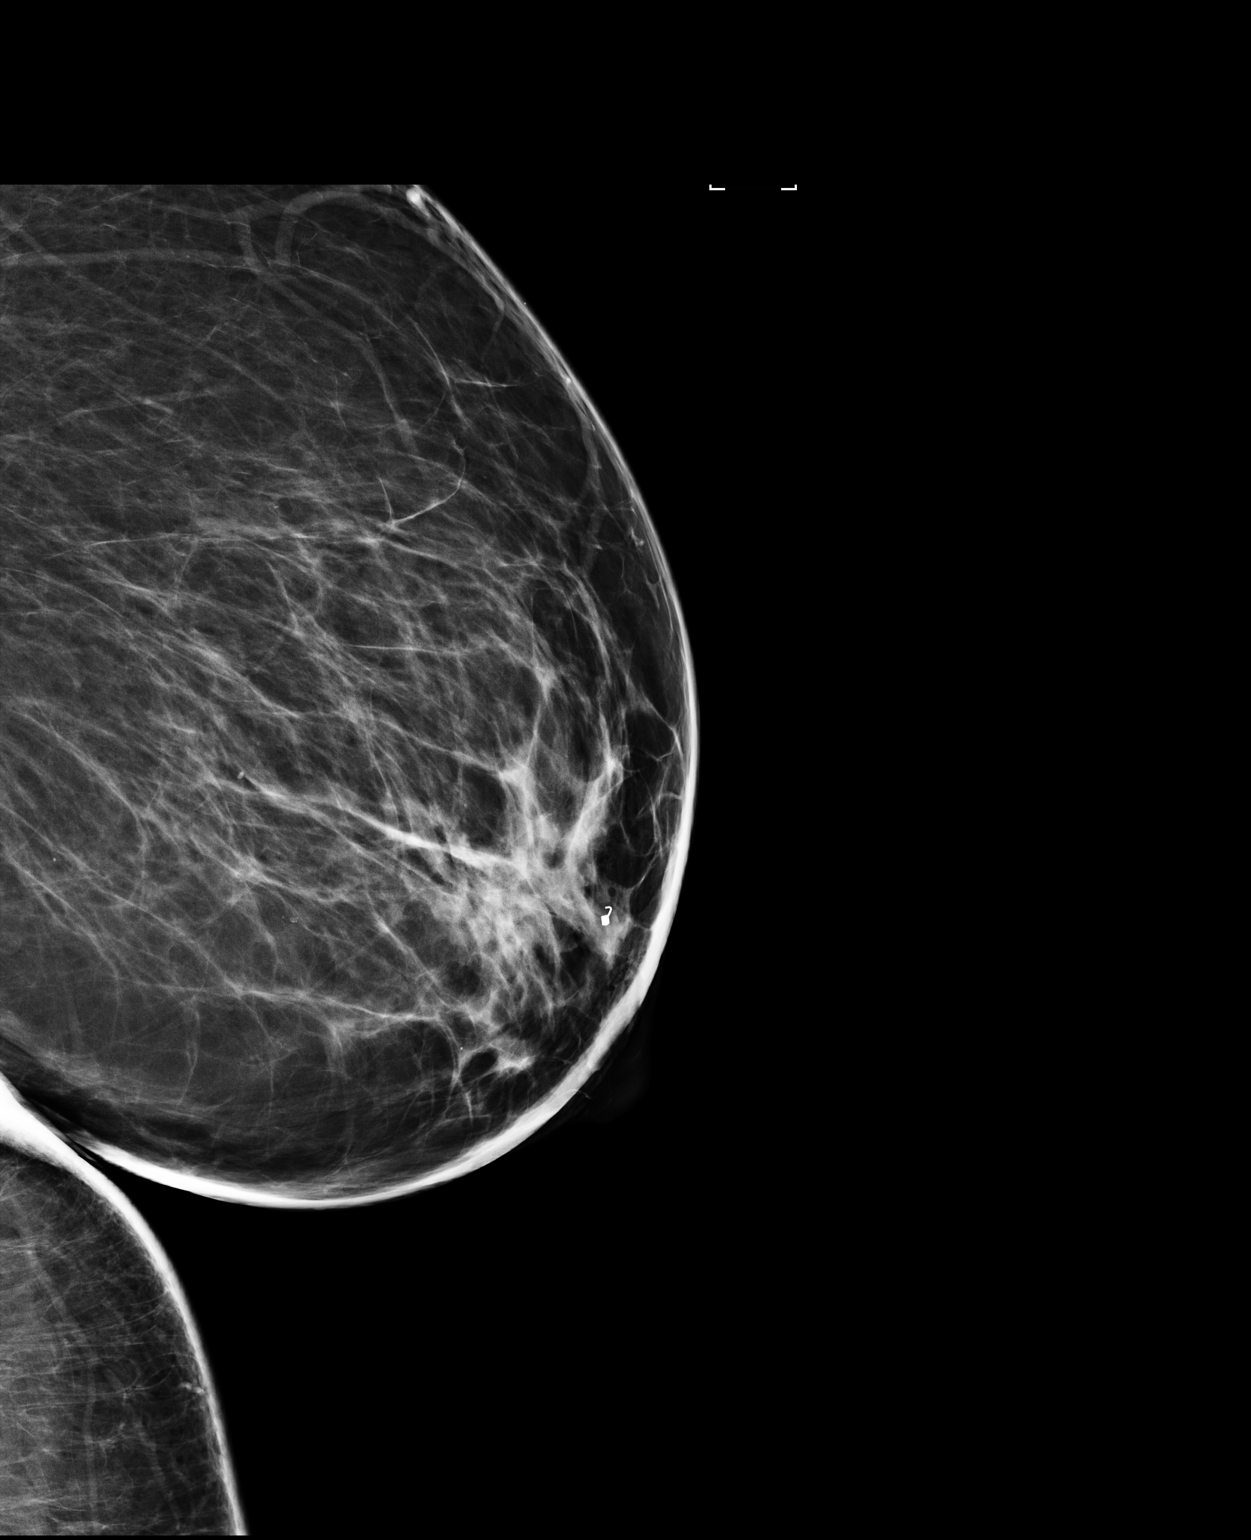

[2 of 2 positions shown; findings below may reference images not displayed]

FINDINGS: Mammographic images were obtained following ultrasound guided biopsy
of a 3 cm mass in the 8 o'clock position of the right breast, right
axillary lymph node and 6 mm mass in the 2 o'clock retroareolar left
breast. These demonstrate a ribbon shaped biopsy marker clip within
the biopsied mass in the 8 o'clock position of the right breast and
a spiral shaped HydroMARK biopsy marker clip in the biopsied right
axillary lymph node. A coil shaped biopsy marker clip is at the
expected location of the biopsied mass in the 2 o'clock retroareolar
left breast.
IMPRESSION: Appropriate clip deployment following right breast, right axillary
and left breast ultrasound-guided core needle biopsies.

Final Assessment: Post Procedure Mammograms for Marker Placement

## 2020-04-25 IMAGING — US US BREAST BX W LOC DEV 1ST LESION IMG BX SPEC US GUIDE*R*
1 series · 12 of 24 positions shown · non-contrast
Comparison: Previous exam(s).

Addendum:
CLINICAL DATA: 3 cm mass in the 8 o'clock position of the right
breast with imaging features highly suspicious for malignancy. Two
borderline abnormal right axillary lymph nodes with mild cortical
thickening. 6 mm indeterminate mass in the 2 o'clock retroareolar
left breast.

EXAM:
ULTRASOUND GUIDED BILATERAL BREAST CORE NEEDLE BIOPSY X 3

[Series 1: us breast bx w loc dev 1st lesion img bx spec us g · 0.07mm/px · 12 of 24 slices shown]
[im 2/24]
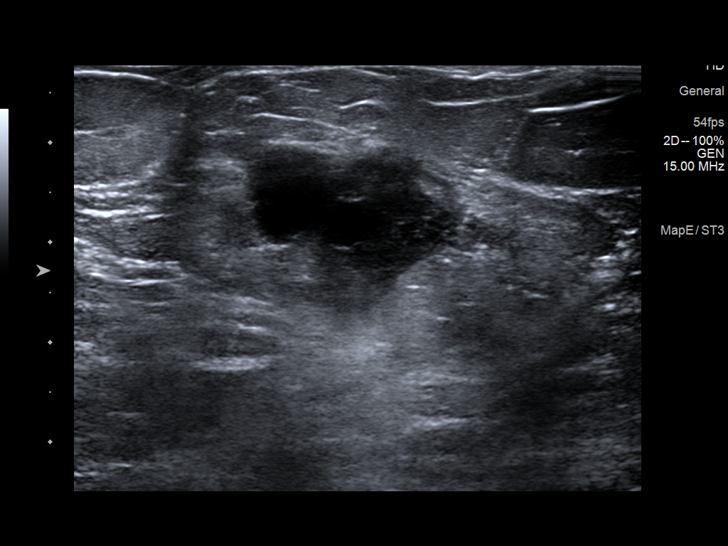
[im 4/24]
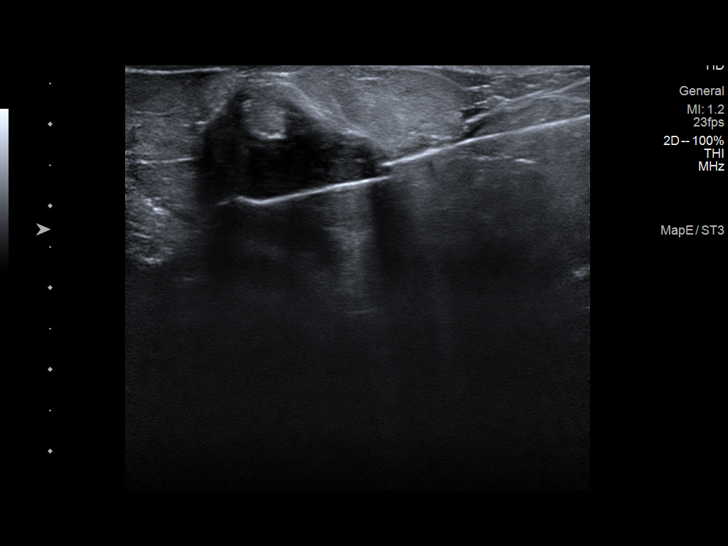
[im 6/24]
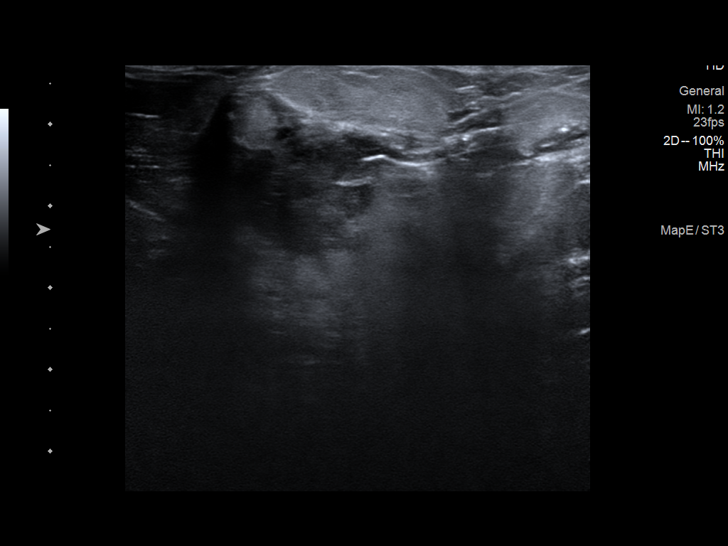
[im 8/24]
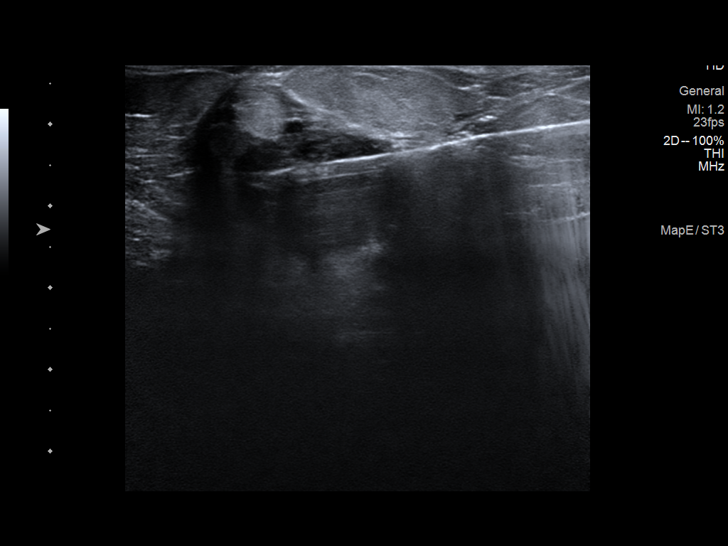
[im 10/24]
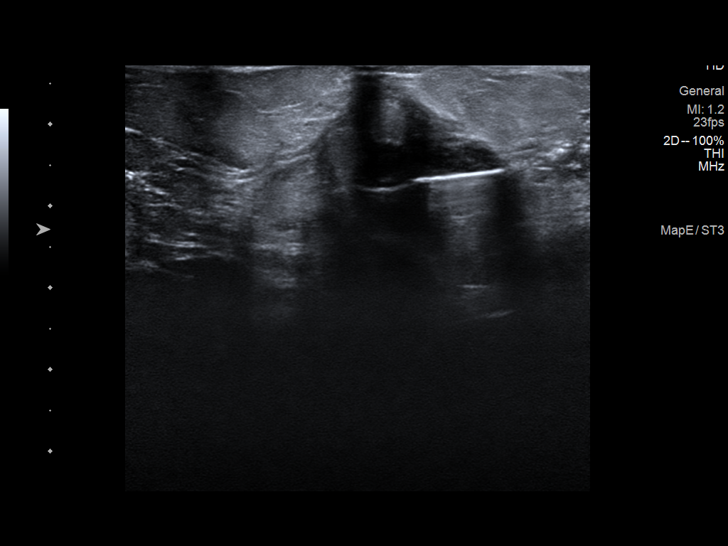
[im 12/24]
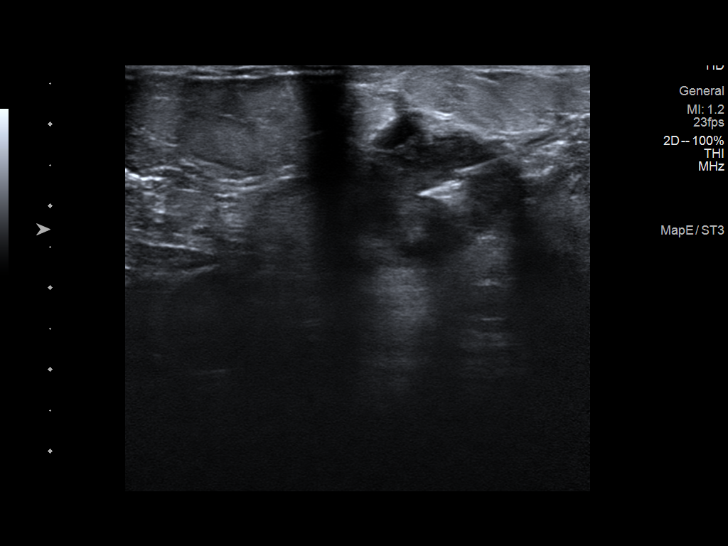
[im 14/24]
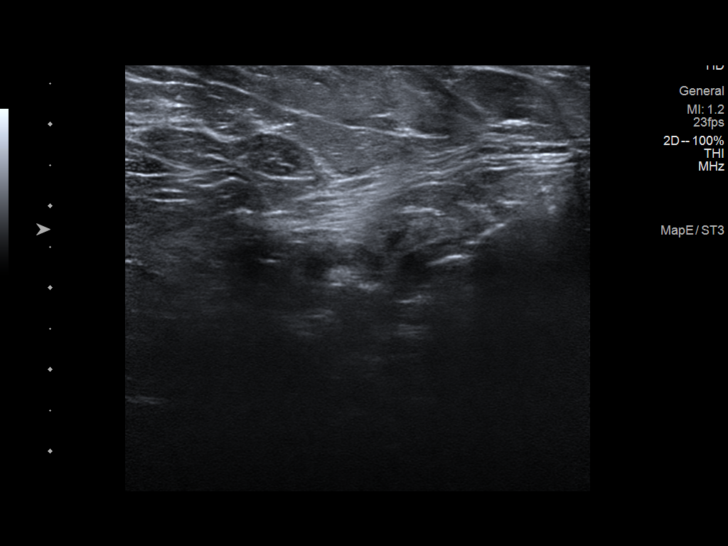
[im 16/24]
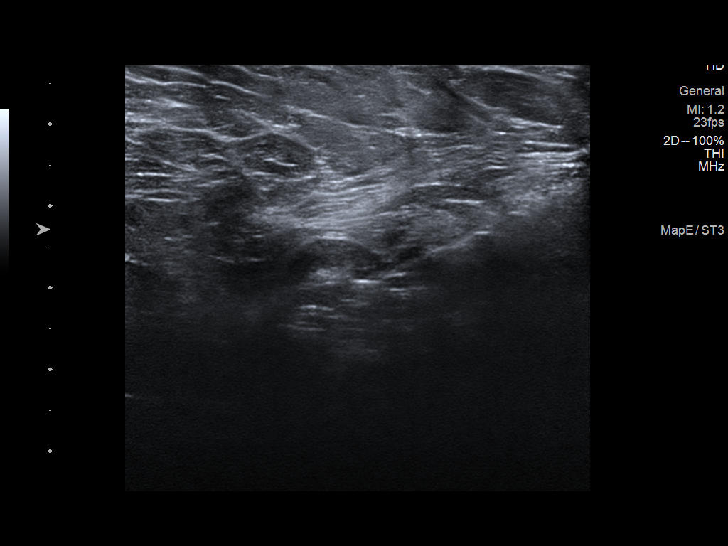
[im 18/24]
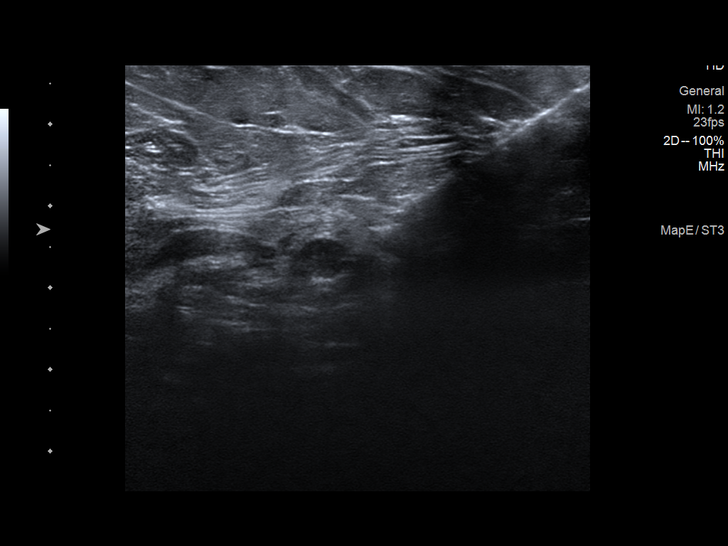
[im 20/24]
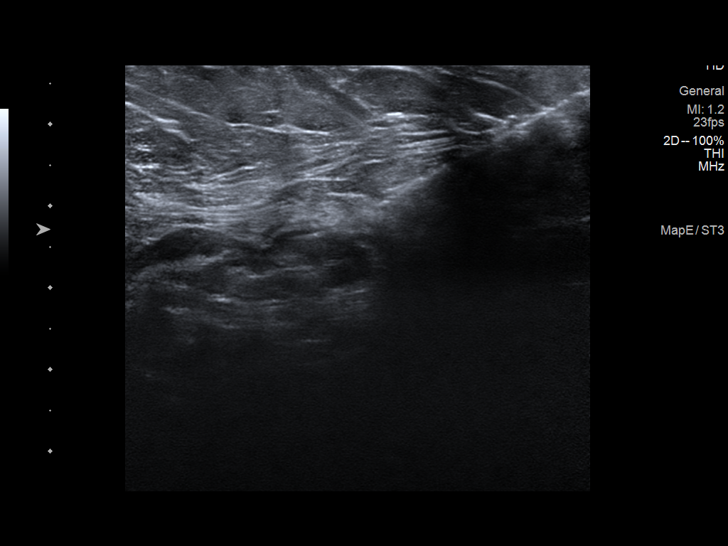
[im 22/24]
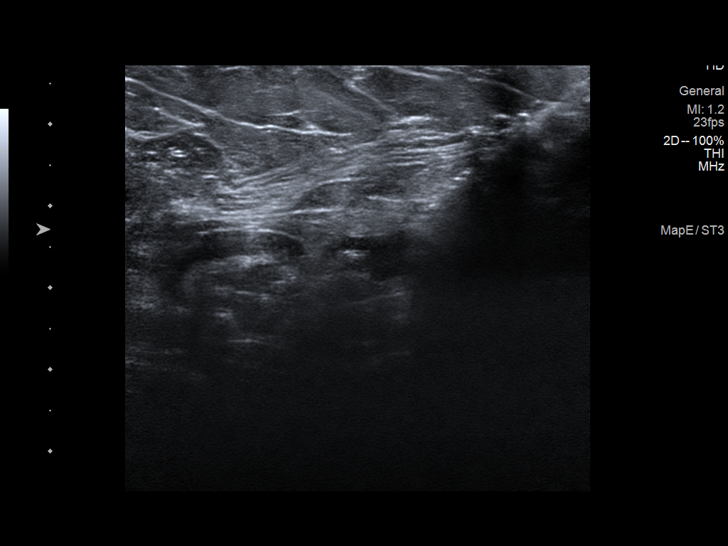
[im 24/24]
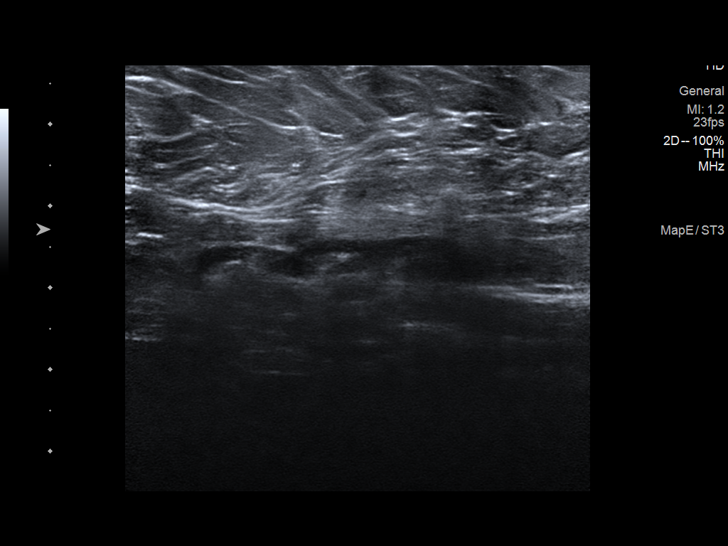

[12 of 24 positions shown; findings below may reference images not displayed]



SITE #1: 8 O'CLOCK RIGHT BREAST

Lesion quadrant: Lower outer quadrant

Using sterile technique and 1% Lidocaine as local anesthetic, under
direct ultrasound visualization, a 12 gauge Tiger device was
used to perform biopsy of the recently demonstrated 3.0 cm mass in
the 8 o'clock position of the right breast, 7 cm from the nipple,
using a caudal approach. Preliminary ultrasound of that area
demonstrated multiple cystic-appearing areas within the 3.0 cm mass.
At the conclusion of the procedure a ribbon shaped tissue marker
clip was deployed into the biopsy cavity. Follow up 2 view mammogram
was performed and dictated separately.

SITE #2: RIGHT AXILLA

Using sterile technique and 1% Lidocaine as local anesthetic, under
direct ultrasound visualization, a 14 gauge Tiger device was
used to perform biopsy of 1 of the previously demonstrated 2 right
axillary lymph nodes demonstrating mild cortical thickening using a
caudal approach. At the conclusion of the procedure a spiral shaped
HydroMARK tissue marker clip was deployed into the biopsy cavity.
Follow up 2 view mammogram was performed and dictated separately.

SITE #3: 2 O'CLOCK RETROAREOLAR LEFT BREAST

Lesion quadrant: Upper outer quadrant

Using sterile technique and 1% Lidocaine as local anesthetic, under
direct ultrasound visualization, a 12 gauge Tiger device was
used to perform biopsy of the recently demonstrated 6 mm mass in the
2 o'clock retroareolar left breast using a inferolateral approach.
At the conclusion of the procedure a coil shaped tissue marker clip
was deployed into the biopsy cavity. Follow up 2 view mammogram was
performed and dictated separately.
IMPRESSION: Ultrasound guided biopsy of a 3.0 cm 8 o'clock right breast mass,
right axillary lymph node with mild cortical thickening and 6 mm 2
o'clock retroareolar left breast mass. No apparent complications.

ADDENDUM:
Pathology revealed HIGH GRADE CARCINOMA WITH NECROSIS of the RIGHT
breast, 8 o'clock. This immunoprofile, though not diagnostic, is
compatible with a primary breast ductal carcinoma in the absence of
any other primary lesions. This was found to be concordant by Dr.
Labais Menger.

Pathology revealed BENIGN LYMPH NODE of RIGHT axilla. This was found
to be concordant by Dr. Labais Menger.

Pathology revealed BENIGN BREAST PARENCHYMA WITH FIBROCYSTIC CHANGE
AND USUAL DUCTAL HYPERPLASIA of the LEFT breast, 2 o'clock
retroareolar. This was found to be concordant by Dr. Labais Menger.

Pathology results were discussed with the patient by telephone. The
patient reported doing well after the biopsies with tenderness at
the sites. Post biopsy instructions and care were reviewed and
questions were answered. The patient was encouraged to call The

The patient was referred to [REDACTED]
[REDACTED] at [REDACTED] on
March 21, 2018.

Recommendation consideration of a bilateral breast MRI and/or PET-CT
for further evaluation since the pathology results are not
definitive for a primary breast carcinoma.

Pathology results reported by Polin Billiot, RN on 03/15/2018.

*** End of Addendum ***

## 2020-04-25 IMAGING — MG MM CLIP PLACEMENT
4 series · 4 of 8 positions shown · non-contrast
Comparison: Previous exam(s).

CLINICAL DATA: Status post ultrasound-guided core needle biopsy of
a 3 cm mass in the 8 o'clock position of the right breast, right
axillary lymph node with mild cortical thickening and 6 mm mass in
the 2 o'clock retroareolar left breast.

EXAM:
DIAGNOSTIC BILATERAL MAMMOGRAM POST ULTRASOUND BIOPSY X 3

[R ML]
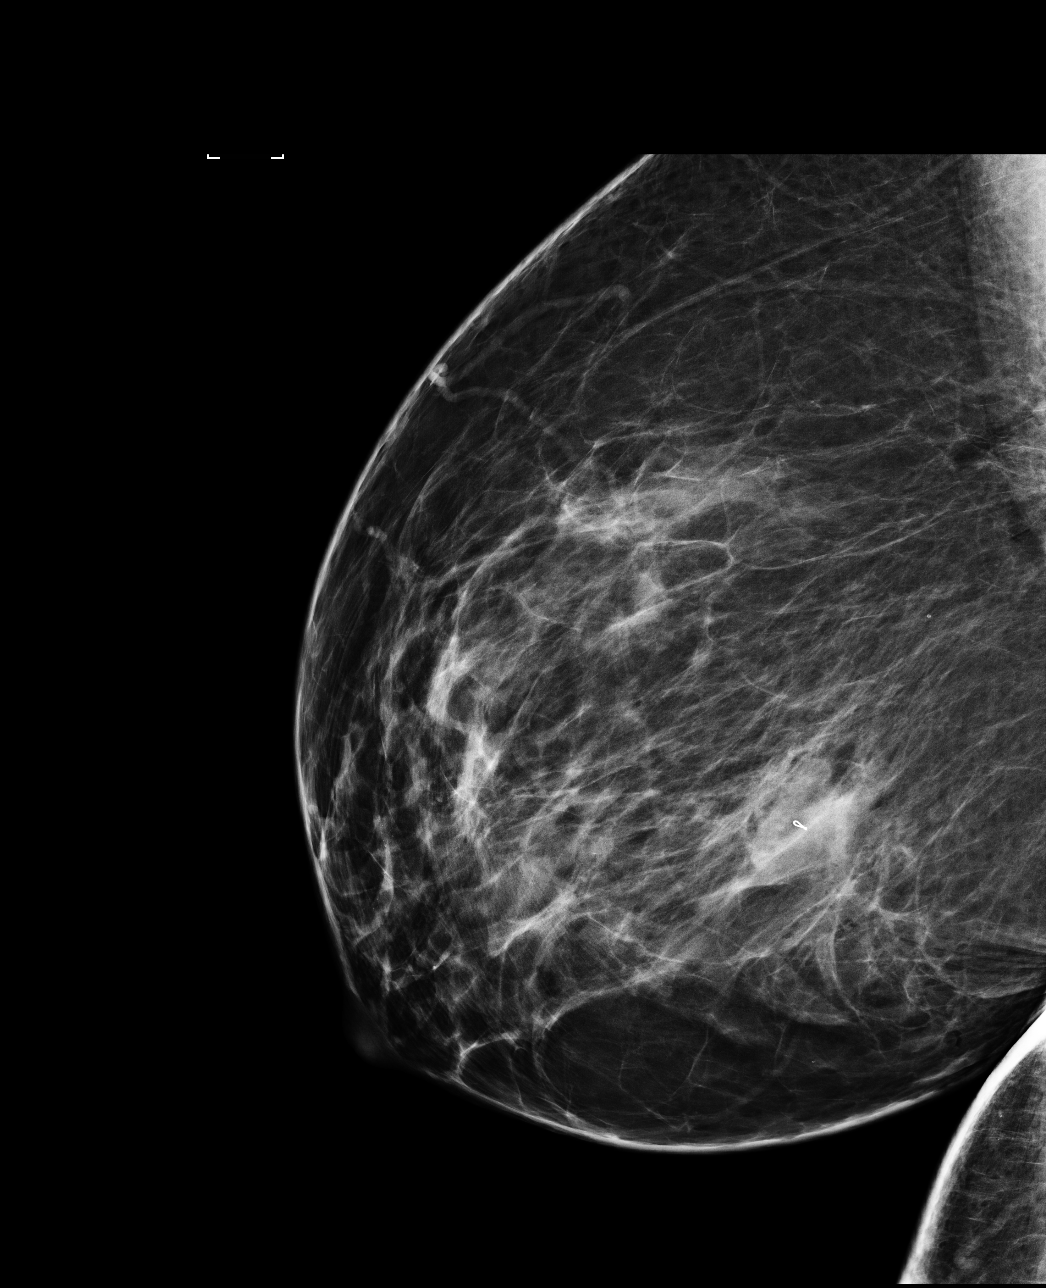

[R CC]
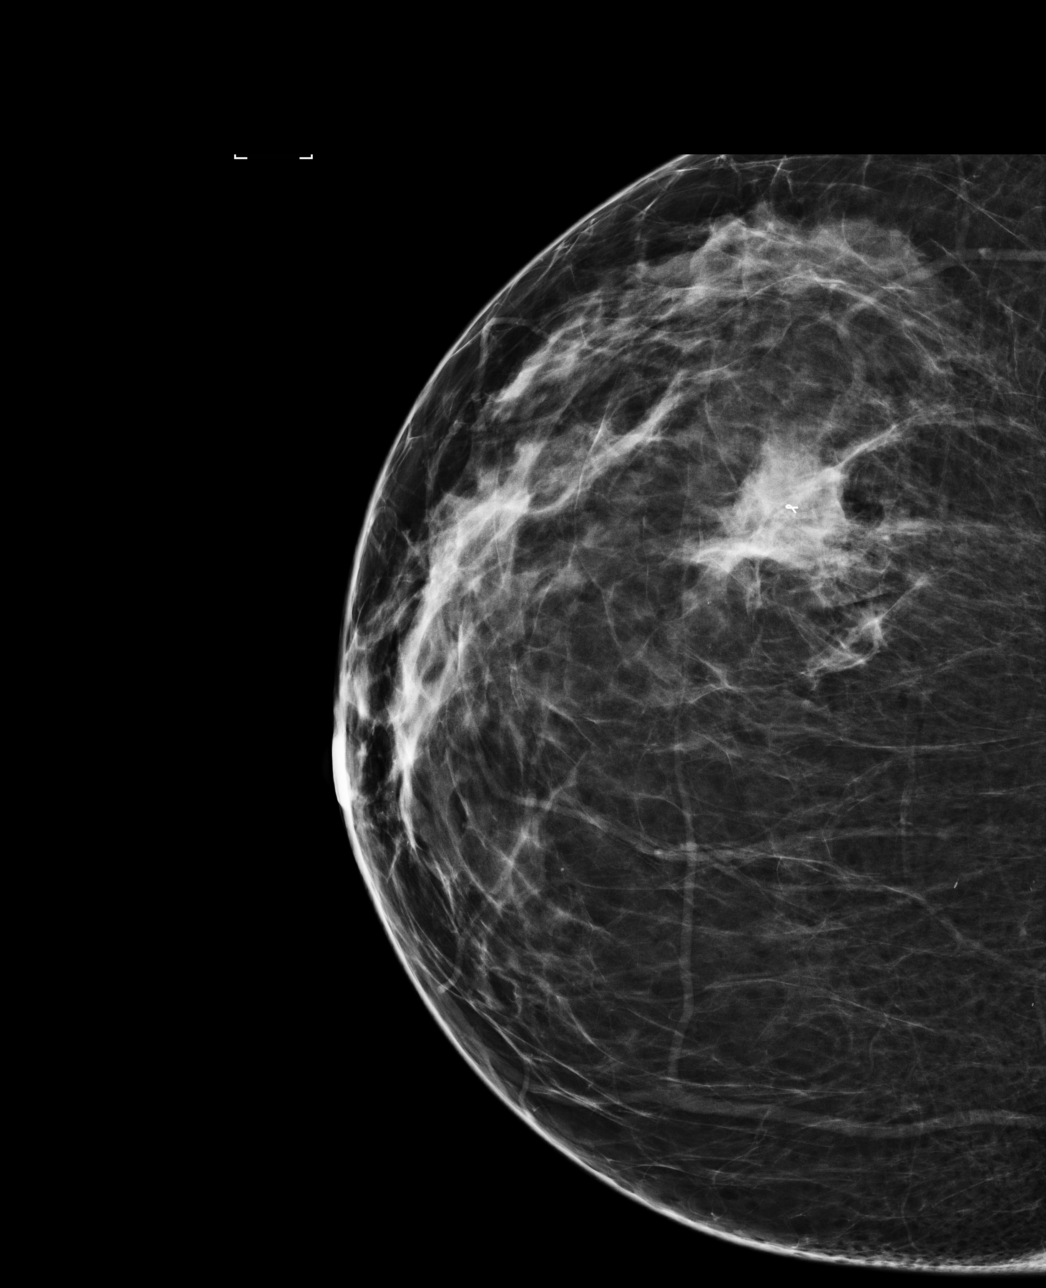

[R MLO synth-2D]
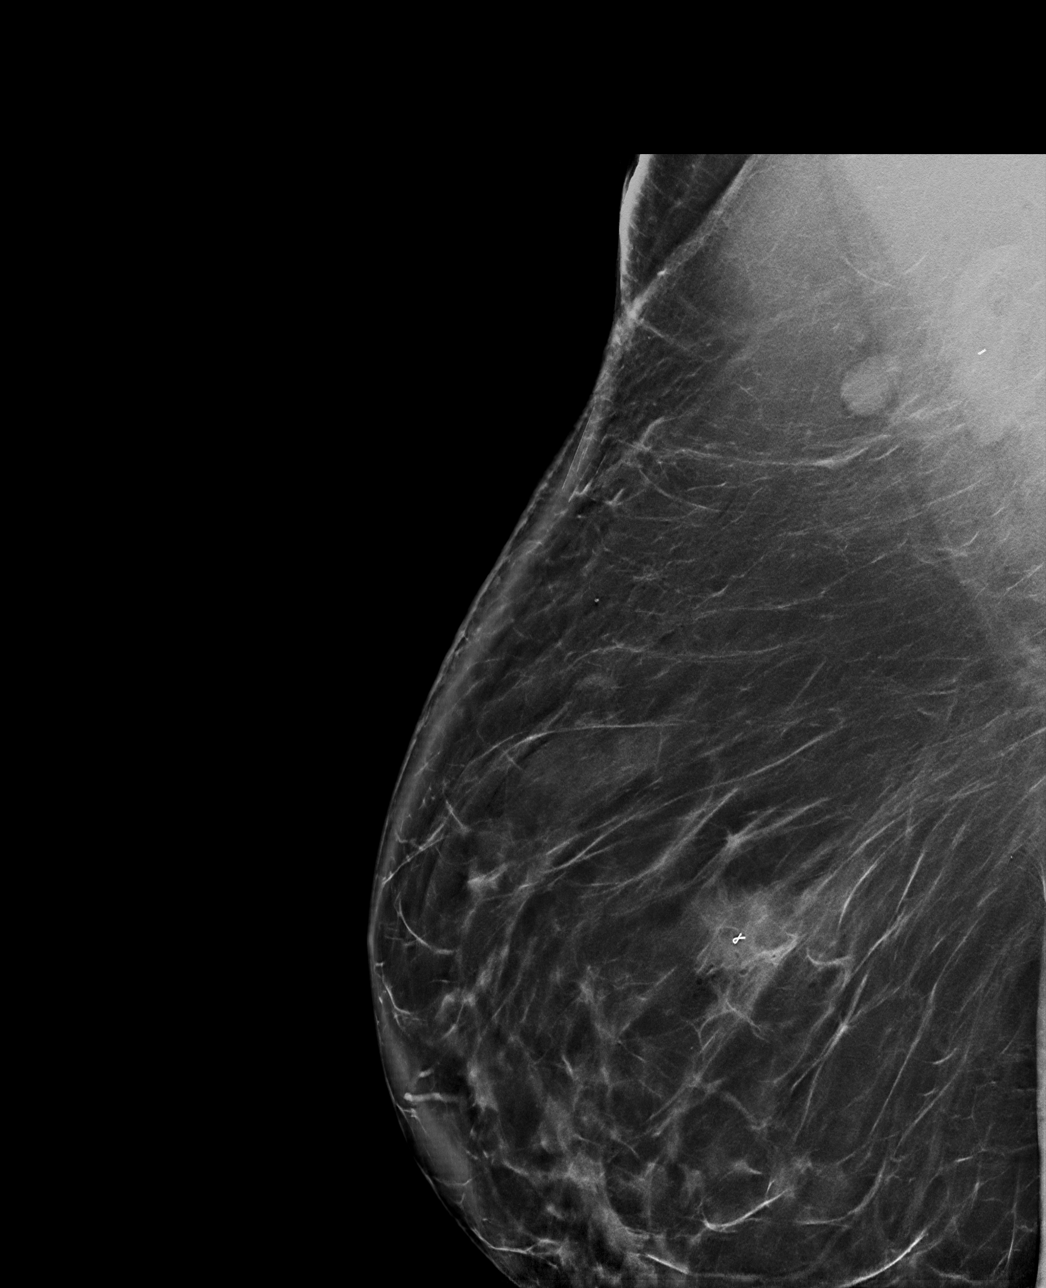

[R MLO tomo · tomo slice 59/117.0]
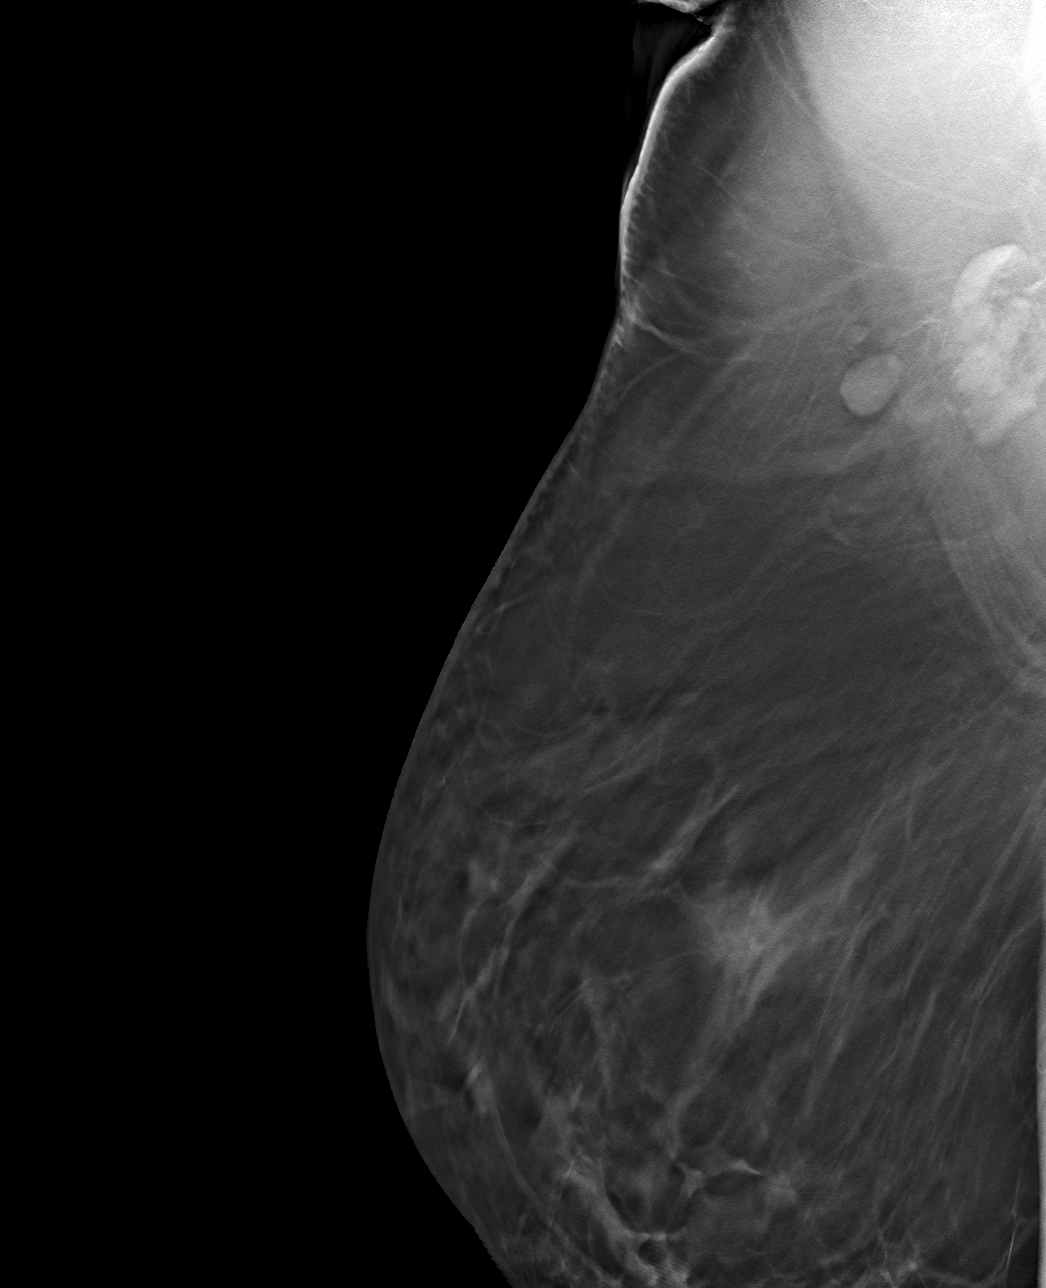

[4 of 8 positions shown; findings below may reference images not displayed]

FINDINGS: Mammographic images were obtained following ultrasound guided biopsy
of a 3 cm mass in the 8 o'clock position of the right breast, right
axillary lymph node and 6 mm mass in the 2 o'clock retroareolar left
breast. These demonstrate a ribbon shaped biopsy marker clip within
the biopsied mass in the 8 o'clock position of the right breast and
a spiral shaped HydroMARK biopsy marker clip in the biopsied right
axillary lymph node. A coil shaped biopsy marker clip is at the
expected location of the biopsied mass in the 2 o'clock retroareolar
left breast.
IMPRESSION: Appropriate clip deployment following right breast, right axillary
and left breast ultrasound-guided core needle biopsies.

Final Assessment: Post Procedure Mammograms for Marker Placement

## 2020-05-05 DIAGNOSIS — M5416 Radiculopathy, lumbar region: Secondary | ICD-10-CM | POA: Diagnosis not present

## 2020-05-05 DIAGNOSIS — E119 Type 2 diabetes mellitus without complications: Secondary | ICD-10-CM | POA: Diagnosis not present

## 2020-05-05 DIAGNOSIS — Z6841 Body Mass Index (BMI) 40.0 and over, adult: Secondary | ICD-10-CM | POA: Diagnosis not present

## 2020-05-05 DIAGNOSIS — M549 Dorsalgia, unspecified: Secondary | ICD-10-CM | POA: Diagnosis not present

## 2020-05-05 DIAGNOSIS — M5136 Other intervertebral disc degeneration, lumbar region: Secondary | ICD-10-CM | POA: Diagnosis not present

## 2020-05-06 ENCOUNTER — Other Ambulatory Visit: Payer: Self-pay | Admitting: Family Medicine

## 2020-05-06 DIAGNOSIS — M5416 Radiculopathy, lumbar region: Secondary | ICD-10-CM

## 2020-05-06 DIAGNOSIS — M5136 Other intervertebral disc degeneration, lumbar region: Secondary | ICD-10-CM

## 2020-05-29 DIAGNOSIS — H5203 Hypermetropia, bilateral: Secondary | ICD-10-CM | POA: Diagnosis not present

## 2020-05-29 DIAGNOSIS — Z0100 Encounter for examination of eyes and vision without abnormal findings: Secondary | ICD-10-CM | POA: Diagnosis not present

## 2020-06-03 ENCOUNTER — Other Ambulatory Visit: Payer: Self-pay

## 2020-06-03 ENCOUNTER — Ambulatory Visit
Admission: RE | Admit: 2020-06-03 | Discharge: 2020-06-03 | Disposition: A | Discharge status: Home / Self Care | Payer: Medicare HMO | Source: Ambulatory Visit | Attending: Family Medicine | Admitting: Family Medicine

## 2020-06-03 DIAGNOSIS — M545 Low back pain, unspecified: Secondary | ICD-10-CM | POA: Diagnosis not present

## 2020-06-03 DIAGNOSIS — M51369 Other intervertebral disc degeneration, lumbar region without mention of lumbar back pain or lower extremity pain: Secondary | ICD-10-CM

## 2020-06-03 DIAGNOSIS — M5136 Other intervertebral disc degeneration, lumbar region: Secondary | ICD-10-CM

## 2020-06-03 DIAGNOSIS — M5416 Radiculopathy, lumbar region: Secondary | ICD-10-CM

## 2020-06-16 ENCOUNTER — Other Ambulatory Visit: Payer: Self-pay

## 2020-06-16 ENCOUNTER — Ambulatory Visit
Admission: RE | Admit: 2020-06-16 | Discharge: 2020-06-16 | Disposition: A | Discharge status: Home / Self Care | Payer: Medicare HMO | Source: Ambulatory Visit | Attending: Oncology | Admitting: Oncology

## 2020-06-16 DIAGNOSIS — R922 Inconclusive mammogram: Secondary | ICD-10-CM | POA: Diagnosis not present

## 2020-06-16 DIAGNOSIS — Z9889 Other specified postprocedural states: Secondary | ICD-10-CM

## 2020-06-16 DIAGNOSIS — Z853 Personal history of malignant neoplasm of breast: Secondary | ICD-10-CM | POA: Diagnosis not present

## 2020-07-06 DIAGNOSIS — M545 Low back pain, unspecified: Secondary | ICD-10-CM | POA: Diagnosis not present

## 2020-07-06 DIAGNOSIS — M25561 Pain in right knee: Secondary | ICD-10-CM | POA: Diagnosis not present

## 2020-08-14 DIAGNOSIS — M25561 Pain in right knee: Secondary | ICD-10-CM | POA: Diagnosis not present

## 2020-11-10 ENCOUNTER — Other Ambulatory Visit: Payer: Self-pay

## 2020-11-10 ENCOUNTER — Encounter (HOSPITAL_COMMUNITY): Payer: Self-pay | Admitting: Emergency Medicine

## 2020-11-10 ENCOUNTER — Emergency Department (HOSPITAL_COMMUNITY)
Admission: EM | Admit: 2020-11-10 | Discharge: 2020-11-10 | Disposition: A | Discharge status: Home / Self Care | Payer: Medicare HMO | Attending: Emergency Medicine | Admitting: Emergency Medicine

## 2020-11-10 ENCOUNTER — Emergency Department (HOSPITAL_COMMUNITY): Payer: Medicare HMO

## 2020-11-10 DIAGNOSIS — D259 Leiomyoma of uterus, unspecified: Secondary | ICD-10-CM | POA: Diagnosis not present

## 2020-11-10 DIAGNOSIS — K8020 Calculus of gallbladder without cholecystitis without obstruction: Secondary | ICD-10-CM | POA: Diagnosis not present

## 2020-11-10 DIAGNOSIS — I288 Other diseases of pulmonary vessels: Secondary | ICD-10-CM | POA: Diagnosis not present

## 2020-11-10 DIAGNOSIS — J45909 Unspecified asthma, uncomplicated: Secondary | ICD-10-CM | POA: Insufficient documentation

## 2020-11-10 DIAGNOSIS — K449 Diaphragmatic hernia without obstruction or gangrene: Secondary | ICD-10-CM | POA: Diagnosis not present

## 2020-11-10 DIAGNOSIS — Z853 Personal history of malignant neoplasm of breast: Secondary | ICD-10-CM | POA: Diagnosis not present

## 2020-11-10 DIAGNOSIS — R0789 Other chest pain: Secondary | ICD-10-CM | POA: Diagnosis not present

## 2020-11-10 DIAGNOSIS — K7689 Other specified diseases of liver: Secondary | ICD-10-CM | POA: Diagnosis not present

## 2020-11-10 DIAGNOSIS — I517 Cardiomegaly: Secondary | ICD-10-CM | POA: Diagnosis not present

## 2020-11-10 LAB — TROPONIN I (HIGH SENSITIVITY)
Troponin I (High Sensitivity): 9 ng/L (ref <18)
Troponin I (High Sensitivity): 9 ng/L (ref <18)

## 2020-11-10 LAB — CBC WITH DIFFERENTIAL/PLATELET
Abs Immature Granulocytes: 0.06 10*3/uL (ref 0.00–0.07)
Basophils Absolute: 0.1 10*3/uL (ref 0.0–0.1)
Basophils Relative: 0 %
Eosinophils Absolute: 0.3 10*3/uL (ref 0.0–0.5)
Eosinophils Relative: 3 %
HCT: 42.5 % (ref 36.0–46.0)
Hemoglobin: 12.5 g/dL (ref 12.0–15.0)
Immature Granulocytes: 1 %
Lymphocytes Relative: 42 %
Lymphs Abs: 4.8 10*3/uL — ABNORMAL HIGH (ref 0.7–4.0)
MCH: 21.9 pg — ABNORMAL LOW (ref 26.0–34.0)
MCHC: 29.4 g/dL — ABNORMAL LOW (ref 30.0–36.0)
MCV: 74.4 fL — ABNORMAL LOW (ref 80.0–100.0)
Monocytes Absolute: 0.8 10*3/uL (ref 0.1–1.0)
Monocytes Relative: 7 %
Neutro Abs: 5.4 10*3/uL (ref 1.7–7.7)
Neutrophils Relative %: 47 %
Platelets: 295 10*3/uL (ref 150–400)
RBC: 5.71 MIL/uL — ABNORMAL HIGH (ref 3.87–5.11)
RDW: 15.6 % — ABNORMAL HIGH (ref 11.5–15.5)
WBC: 11.4 10*3/uL — ABNORMAL HIGH (ref 4.0–10.5)
nRBC: 0 % (ref 0.0–0.2)

## 2020-11-10 LAB — BASIC METABOLIC PANEL
Anion gap: 9 (ref 5–15)
BUN: 11 mg/dL (ref 8–23)
CO2: 28 mmol/L (ref 22–32)
Calcium: 9.5 mg/dL (ref 8.9–10.3)
Chloride: 101 mmol/L (ref 98–111)
Creatinine, Ser: 0.95 mg/dL (ref 0.44–1.00)
GFR, Estimated: >60 mL/min (ref >60)
Glucose, Bld: 109 mg/dL — ABNORMAL HIGH (ref 70–99)
Potassium: 4.4 mmol/L (ref 3.5–5.1)
Sodium: 138 mmol/L (ref 135–145)

## 2020-11-10 MED ORDER — HYDROCODONE-ACETAMINOPHEN 5-325 MG PO TABS: 1.0000 | ORAL_TABLET | Freq: Four times a day (QID) | ORAL | 0 refills | Status: DC | PRN

## 2020-11-10 MED ORDER — FENTANYL CITRATE PF 50 MCG/ML IJ SOSY
50.0000 ug | PREFILLED_SYRINGE | Freq: Once | INTRAMUSCULAR | Status: AC
  Administered 2020-11-10: 50 ug via INTRAVENOUS
  Filled 2020-11-10: qty 1

## 2020-11-10 MED ORDER — IOHEXOL 350 MG/ML SOLN
100.0000 mL | Freq: Once | INTRAVENOUS | Status: AC | PRN
  Administered 2020-11-10: 100 mL via INTRAVENOUS

## 2020-11-10 NOTE — ED Triage Notes (Signed)
Patient reports intermittent mid chest pain with mild SOB onset Sunday evening , denies emesis or diaphoresis , no cough or fever.

## 2020-11-10 NOTE — ED Provider Notes (Signed)
[place] EMERGENCY DEPARTMENT Provider Note  CSN: [id number] Arrival date & time: 11/10/20 0609    History Chief Complaint  Patient presents with   Chest Pain    Kristina Rivera is a 60 y.o. female with history of breast cancer, s/p lumpectomy and chemo, she refused radiation due to 'a sensitivity to electromagnetic radiation' but currently in remission. She reports 3 days of sharp, intermittent, moderate to severe L sided chest pain radiating into her upper back, worse with movement and deep inspiration. No SOB, cough or fever. No leg swelling or recent travel. No nausea, vomiting or diaphoresis.    Past Medical History:  Diagnosis Date   Anemia    Anxiety    pt denies   Arthritis    "back; knees; shoulders" (04/19/2013)   Asthma    onset as adult   Cancer [facility])    right breast cancer   Chronic lower back pain    Daily headache    "last couple months" (04/19/2013)   Depression    during a personal crisis   Family history of breast cancer    Family history of lung cancer    Heart murmur    as a child - no problems   History of kidney stones    Palpitations    Personal history of chemotherapy    PONV (postoperative nausea and vomiting)    couple of days of nausea after lumpectomy   Radiation disease    pt states she is very sensitive to electromagnetic things - makes her BP go very high, also causes Asthma symptoms     Past Surgical History:  Procedure Laterality Date   BREAST BIOPSY Right 02/2018   malignant   BREAST BIOPSY Left 02/2018   benign   BREAST LUMPECTOMY Right 03/2018   BREAST LUMPECTOMY WITH AXILLARY LYMPH NODE BIOPSY Right 03/28/2018   Procedure: RIGHT BREAST LUMPECTOMY WITH RIGHT SENTINEL LYMPH NODE BIOPSY;  Surgeon: Stark Klein, MD;  Location: [place];  Service: General;  Laterality: Right;   BREAST LUMPECTOMY WITH SENTINEL LYMPH NODE BIOPSY Right 08/01/2019   Procedure: RIGHT BREAST LUMPECTOMY  BLUE DYE INJECTION;  Surgeon: Stark Klein, MD;   Location: [place];  Service: General;  Laterality: Right;   [place]?   PORT-A-CATH REMOVAL Left 10/18/2018   Procedure: REMOVAL PORT-A-CATH;  Surgeon: Stark Klein, MD;  Location: [place];  Service: General;  Laterality: Left;   PORTACATH PLACEMENT Left 05/14/2018   Procedure: INSERTION PORT-A-CATH;  Surgeon: Stark Klein, MD;  Location: [place];  Service: General;  Laterality: Left;   ruptured disc     Lumbar   SENTINEL NODE BIOPSY Right 08/01/2019   Procedure: Right Sentinel Node Biopsy;  Surgeon: Stark Klein, MD;  Location: [place];  Service: General;  Laterality: Right;   TUBAL LIGATION  1993    Family History  Problem Relation Age of Onset   Breast cancer Maternal Aunt 90   Pneumonia Mother    Lung cancer Mother 38   Stroke Father    Head & neck cancer Sister 56       neck   Prostate cancer Neg Hx     Social History   Tobacco Use   Smoking status: Never   Smokeless tobacco: Never  Vaping Use   Vaping Use: Never used  Substance Use Topics   Alcohol use: No   Drug use: No     Home Medications Prior to Admission medications   Medication Sig Start  Date End Date Taking? Authorizing Provider  HYDROcodone-acetaminophen (NORCO/VICODIN) 5-325 MG tablet Take 1 tablet by mouth every 6 (six) hours as needed for severe pain. 11/10/20  Yes Truddie Hidden, MD  acetaminophen (TYLENOL) 650 MG CR tablet Take 650 mg by mouth every 8 (eight) hours as needed for pain.     [provider]  albuterol (VENTOLIN HFA) 108 (90 BASE) MCG/ACT inhaler Inhale 2 puffs into the lungs as needed. Patient taking differently: Inhale 2 puffs into the lungs every 6 (six) hours as needed for wheezing or shortness of breath.  11/23/12   Sanborn, Dayarmys, MD  fluticasone [facility]) 50 MCG/ACT nasal spray Place 1 spray into both nostrils daily as needed for allergies or rhinitis.    [provider]  letrozole (FEMARA) 2.5 MG tablet Take 1 tablet (2.5 mg total) by  mouth daily. Patient not taking: Reported on 09/18/2019 08/13/19   Magrinat, [name], MD  Multiple Vitamins-Minerals (EMERGEN-C IMMUNE PLUS) PACK Take 1 Package by mouth daily.    [provider]  OVER THE COUNTER MEDICATION Take 1 capsule by mouth daily. Chaga Mushroom Patient not taking: Reported on 09/18/2019    [provider]  OVER THE COUNTER MEDICATION Take 1 capsule by mouth daily. Gano Mushroom Patient not taking: Reported on 09/18/2019    [provider]     Allergies    Aspirin, Nsaids, and Other   Review of Systems   Review of Systems A comprehensive review of systems was completed and negative except as noted in HPI.    Physical Exam BP (!) 154/84 (BP Location: Right Arm)   Pulse 76   Temp 98.7 F (37.1 C) (Oral)   Resp 18   Ht '5\' 2"'$  (1.575 m)   Wt (!) 152 kg   SpO2 94%   BMI 61.29 kg/m   Physical Exam Vitals and nursing note reviewed.  Constitutional:      Appearance: Normal appearance.  HENT:     Head: Normocephalic and atraumatic.     Nose: Nose normal.     Mouth/Throat:     Mouth: Mucous membranes are moist.  Eyes:     Extraocular Movements: Extraocular movements intact.     Conjunctiva/sclera: Conjunctivae normal.  Cardiovascular:     Rate and Rhythm: Normal rate.  Pulmonary:     Effort: Pulmonary effort is normal.     Breath sounds: Normal breath sounds.  Chest:     Chest wall: Tenderness (L parasternal chest, reproduces symptoms) present.  Abdominal:     General: Abdomen is flat.     Palpations: Abdomen is soft.     Tenderness: There is no abdominal tenderness.  Musculoskeletal:        General: No swelling. Normal range of motion.     Cervical back: Neck supple.  Skin:    General: Skin is warm and dry.  Neurological:     General: No focal deficit present.     Mental Status: She is alert.  Psychiatric:        Mood and Affect: Mood normal.     ED Results / Procedures / Treatments   Labs (all labs ordered are  listed, but only abnormal results are displayed) Labs Reviewed  CBC WITH DIFFERENTIAL/PLATELET - Abnormal; Notable for the following components:      Result Value   WBC 11.4 (*)    RBC 5.71 (*)    MCV 74.4 (*)    MCH 21.9 (*)  MCHC 29.4 (*)    RDW 15.6 (*)    Lymphs Abs 4.8 (*)    All other components within normal limits  BASIC METABOLIC PANEL - Abnormal; Notable for the following components:   Glucose, Bld 109 (*)    All other components within normal limits  TROPONIN I (HIGH SENSITIVITY)  TROPONIN I (HIGH SENSITIVITY)    EKG EKG Interpretation  Date/Time:  Tuesday November 10 2020 06:15:57 EDT Ventricular Rate:  83 PR Interval:  128 QRS Duration: 82 QT Interval:  386 QTC Calculation: 453 R Axis:   -40 Text Interpretation: Normal sinus rhythm Left axis deviation Pulmonary disease pattern Abnormal ECG No significant change since last tracing Confirmed by Calvert Cantor [phone]) on 11/10/2020 8:19:50 AM  Radiology CT Angio Chest/Abd/Pel for Dissection W and/or Wo Contrast  Result Date: 11/10/2020 CLINICAL DATA:  Concern for aortic dissection EXAM: CT ANGIOGRAPHY CHEST, ABDOMEN AND PELVIS TECHNIQUE: Non-contrast CT of the chest was initially obtained. Multidetector CT imaging through the chest, abdomen and pelvis was performed using the standard protocol during bolus administration of intravenous contrast. Multiplanar reconstructed images and MIPs were obtained and reviewed to evaluate the vascular anatomy. CONTRAST:  139m OMNIPAQUE IOHEXOL 350 MG/ML SOLN COMPARISON:  Chest CT dated June 21, 2019 FINDINGS: CTA CHEST FINDINGS Cardiovascular: Cardiomegaly. No pericardial effusion. Dilated main pulmonary artery, measuring up to 3.7 cm. No significant coronary artery calcifications or atherosclerotic disease of the thoracic aorta normal caliber thoracic aorta with no evidence of dissection or intramural hematoma. Aortic root measures up to 2.9 cm at the sinuses of Valsalva, ascending  thoracic aorta measures up to 2.6 cm, aortic arch measures up to 2.2 cm, and ascending thoracic aorta measures up to 2.2 cm. Arch vessels are widely patent. Mediastinum/Nodes: Small hiatal hernia. Thyroid is unremarkable. Enlarged right axillary lymph node measuring 1.4 cm, unchanged compared to June 21, 2019 chest CT. Otherwise, no pathologically enlarged lymph nodes seen in the chest Lungs/Pleura: Central airways are patent no consolidation, pleural effusion or pneumothorax. Musculoskeletal: No chest wall abnormality. No acute or significant osseous findings. Review of the MIP images confirms the above findings. CTA ABDOMEN AND PELVIS FINDINGS VASCULAR Aorta: Normal caliber abdominal aorta with no evidence of dissection or intramural hematoma. Suprarenal abdominal aorta measures up to 2.0 cm infrarenal abdominal aorta measures up to 1.6 cm. No significant atherosclerotic disease. Celiac: Patent without evidence of aneurysm, dissection, vasculitis or significant stenosis. SMA: Patent without evidence of aneurysm, dissection, vasculitis or significant stenosis. Renals: Both renal arteries are patent without evidence of aneurysm, dissection, vasculitis, fibromuscular dysplasia or significant stenosis. IMA: Patent without evidence of aneurysm, dissection, vasculitis or significant stenosis. Iliofemoral arteries: Patent without evidence of aneurysm, dissection, vasculitis or significant stenosis. Veins: No obvious venous abnormality within the limitations of this arterial phase study. NON-VASCULAR Hepatobiliary: Arterially enhancing lesion of the left lobe of the liver measuring 1 point 6 cm on series 6, image 118. Cholelithiasis. Normal gallbladder wall thickness. No biliary ductal dilation. Pancreas: Unremarkable Spleen: Unremarkable Adrenals/Urinary Tract: Adrenal glands are unremarkable. Kidneys are normal, without renal calculi, focal lesion, or hydronephrosis. Bladder is unremarkable. Stomach/Bowel: Stomach is  within normal limits. Appendix appears normal. Scattered colonic diverticula. No evidence of bowel wall thickening, distention, or inflammatory changes. Lymphatic: No pathologically enlarged lymph nodes in the abdomen or pelvis. Reproductive: Fibroid uterus.  Adnexa are unremarkable. Other: No abdominal wall hernia or abnormality. No abdominopelvic ascites. Musculoskeletal: No acute or significant osseous findings. IMPRESSION: Normal caliber aorta with no evidence of acute aortic  syndrome. No CT acute findings in the chest, abdominal or pelvis to explain chest and back pain. Cardiomegaly. Dilated main pulmonary artery, findings can be seen in the setting of pulmonary hypertension. Electronically Signed   By: Yetta Glassman M.D.   On: 11/10/2020 10:10    Procedures Procedures  Medications Ordered in the ED Medications  fentaNYL (SUBLIMAZE) injection 50 mcg (50 mcg Intravenous Given 11/10/20 0825)  iohexol (OMNIPAQUE) 350 MG/ML injection 100 mL (100 mLs Intravenous Contrast Given 11/10/20 0914)     MDM Rules/Calculators/A&P MDM Patient here with reproducible chest pain radiating into her back. She is hypertensive here which she attributes to being around our [facility], but in general does not have a diagnosis of HTN. Labs ordered in triage are unremarkable aside from mild leukocytosis. Will send for CTA to rule out dissection. Fentanyl for pain.   ED Course  I have reviewed the triage vital signs and the nursing notes.  Pertinent labs & imaging results that were available during my care of the patient were reviewed by me and considered in my medical decision making (see chart for details).  Clinical Course as of 11/10/20 1054  Tue Nov 10, 2020  1051 CTA is neg for dissection or PE. She has had improved pain with fentanyl. She has been taking Tramadol at home with minimal improvement. She is low risk for ACS, atypical, reproducible pain ongoing for 3 days with neg Trop x 2. No concern for AMI. Plan  discharge home with Rx for Norco. PCP follow up.  [CS]    Clinical Course User Index [CS] Truddie Hidden, MD    Final Clinical Impression(s) / ED Diagnoses Final diagnoses:  Atypical chest pain    Rx / DC Orders ED Discharge Orders          Ordered    HYDROcodone-acetaminophen (NORCO/VICODIN) 5-325 MG tablet  Every 6 hours PRN        11/10/20 1054             Truddie Hidden, MD 11/10/20 1054

## 2020-11-10 NOTE — ED Provider Notes (Signed)
Emergency Medicine Provider Triage Evaluation Note  Kristina Rivera , a 60 y.o. female  was evaluated in triage.  Pt complains of chest pain that radiates to the back and neck.  Denies n/v/d.  States that the symptoms started yesterday.  Denies any fevers or chills.    Review of Systems  Positive: Chest pain, back pain Negative: Fever, chills  Physical Exam  BP (!) 175/153 (BP Location: Right Arm)   Pulse 80   Temp 98.8 F (37.1 C) (Oral)   Resp 19   Ht '5\' 2"'$  (1.575 m)   Wt (!) 152 kg   SpO2 96%   BMI 61.29 kg/m  Gen:   Awake, no distress   Resp:  Normal effort  MSK:   Moves extremities without difficulty  Other:    Medical Decision Making  Medically screening exam initiated at 6:23 AM.  Appropriate orders placed.  Audrea Fantauzzi was informed that the remainder of the evaluation will be completed by another provider, this initial triage assessment does not replace that evaluation, and the importance of remaining in the ED until their evaluation is complete.  Chest pain/back pain   Lajla, [place], PA-C 11/10/20 [id number]    Merryl Hacker, MD 11/10/20 [phone]

## 2021-02-03 DIAGNOSIS — C50911 Malignant neoplasm of unspecified site of right female breast: Secondary | ICD-10-CM | POA: Diagnosis not present

## 2021-03-11 DIAGNOSIS — C50911 Malignant neoplasm of unspecified site of right female breast: Secondary | ICD-10-CM | POA: Diagnosis not present

## 2021-03-19 ENCOUNTER — Other Ambulatory Visit (HOSPITAL_COMMUNITY)
Admission: RE | Admit: 2021-03-19 | Discharge: 2021-03-19 | Disposition: A | Discharge status: Home / Self Care | Payer: Medicare HMO | Source: Ambulatory Visit | Attending: Family Medicine | Admitting: Family Medicine

## 2021-03-19 DIAGNOSIS — E559 Vitamin D deficiency, unspecified: Secondary | ICD-10-CM | POA: Diagnosis not present

## 2021-03-19 DIAGNOSIS — Z1151 Encounter for screening for human papillomavirus (HPV): Secondary | ICD-10-CM | POA: Diagnosis not present

## 2021-03-19 DIAGNOSIS — E1169 Type 2 diabetes mellitus with other specified complication: Secondary | ICD-10-CM | POA: Diagnosis not present

## 2021-03-19 DIAGNOSIS — E119 Type 2 diabetes mellitus without complications: Secondary | ICD-10-CM | POA: Diagnosis not present

## 2021-03-19 DIAGNOSIS — Z0000 Encounter for general adult medical examination without abnormal findings: Secondary | ICD-10-CM | POA: Diagnosis not present

## 2021-03-19 DIAGNOSIS — Z79899 Other long term (current) drug therapy: Secondary | ICD-10-CM | POA: Diagnosis not present

## 2021-03-19 DIAGNOSIS — C50911 Malignant neoplasm of unspecified site of right female breast: Secondary | ICD-10-CM | POA: Diagnosis not present

## 2021-03-19 DIAGNOSIS — Z1211 Encounter for screening for malignant neoplasm of colon: Secondary | ICD-10-CM | POA: Diagnosis not present

## 2021-03-19 DIAGNOSIS — Z01411 Encounter for gynecological examination (general) (routine) with abnormal findings: Secondary | ICD-10-CM | POA: Diagnosis not present

## 2021-03-19 DIAGNOSIS — J4520 Mild intermittent asthma, uncomplicated: Secondary | ICD-10-CM | POA: Diagnosis not present

## 2021-03-19 DIAGNOSIS — Z1159 Encounter for screening for other viral diseases: Secondary | ICD-10-CM | POA: Diagnosis not present

## 2021-03-24 LAB — CYTOLOGY - PAP
Adequacy: ABSENT
Comment: NEGATIVE
Diagnosis: NEGATIVE
High risk HPV: NEGATIVE

## 2021-05-11 DIAGNOSIS — Z1211 Encounter for screening for malignant neoplasm of colon: Secondary | ICD-10-CM | POA: Diagnosis not present

## 2021-05-18 ENCOUNTER — Other Ambulatory Visit: Payer: Self-pay | Admitting: Family Medicine

## 2021-05-18 DIAGNOSIS — Z1231 Encounter for screening mammogram for malignant neoplasm of breast: Secondary | ICD-10-CM

## 2021-05-25 DIAGNOSIS — C50911 Malignant neoplasm of unspecified site of right female breast: Secondary | ICD-10-CM | POA: Diagnosis not present

## 2021-06-17 ENCOUNTER — Ambulatory Visit
Admission: RE | Admit: 2021-06-17 | Discharge: 2021-06-17 | Disposition: A | Discharge status: Home / Self Care | Payer: Medicare HMO | Source: Ambulatory Visit | Attending: Family Medicine | Admitting: Family Medicine

## 2021-06-17 DIAGNOSIS — Z1231 Encounter for screening mammogram for malignant neoplasm of breast: Secondary | ICD-10-CM

## 2021-06-21 ENCOUNTER — Other Ambulatory Visit: Payer: Self-pay | Admitting: Family Medicine

## 2021-06-21 DIAGNOSIS — R928 Other abnormal and inconclusive findings on diagnostic imaging of breast: Secondary | ICD-10-CM

## 2021-06-29 ENCOUNTER — Other Ambulatory Visit: Payer: Self-pay | Admitting: Family Medicine

## 2021-06-29 ENCOUNTER — Ambulatory Visit
Admission: RE | Admit: 2021-06-29 | Discharge: 2021-06-29 | Disposition: A | Discharge status: Home / Self Care | Payer: Medicare HMO | Source: Ambulatory Visit | Attending: Family Medicine | Admitting: Family Medicine

## 2021-06-29 DIAGNOSIS — R928 Other abnormal and inconclusive findings on diagnostic imaging of breast: Secondary | ICD-10-CM

## 2021-06-29 DIAGNOSIS — N6310 Unspecified lump in the right breast, unspecified quadrant: Secondary | ICD-10-CM

## 2021-06-29 DIAGNOSIS — R921 Mammographic calcification found on diagnostic imaging of breast: Secondary | ICD-10-CM

## 2021-06-29 DIAGNOSIS — N6311 Unspecified lump in the right breast, upper outer quadrant: Secondary | ICD-10-CM | POA: Diagnosis not present

## 2021-06-29 DIAGNOSIS — R920 Mammographic microcalcification found on diagnostic imaging of breast: Secondary | ICD-10-CM | POA: Diagnosis not present

## 2021-07-09 ENCOUNTER — Other Ambulatory Visit: Payer: Medicare HMO

## 2021-08-02 ENCOUNTER — Other Ambulatory Visit: Payer: Self-pay | Admitting: Family Medicine

## 2021-08-02 ENCOUNTER — Ambulatory Visit
Admission: RE | Admit: 2021-08-02 | Discharge: 2021-08-02 | Disposition: A | Discharge status: Home / Self Care | Payer: Medicare HMO | Source: Ambulatory Visit | Attending: Family Medicine | Admitting: Family Medicine

## 2021-08-02 ENCOUNTER — Other Ambulatory Visit: Payer: Medicare HMO

## 2021-08-02 DIAGNOSIS — R921 Mammographic calcification found on diagnostic imaging of breast: Secondary | ICD-10-CM

## 2021-08-02 DIAGNOSIS — D0511 Intraductal carcinoma in situ of right breast: Secondary | ICD-10-CM | POA: Diagnosis not present

## 2021-08-02 DIAGNOSIS — N6310 Unspecified lump in the right breast, unspecified quadrant: Secondary | ICD-10-CM

## 2021-08-02 DIAGNOSIS — N6311 Unspecified lump in the right breast, upper outer quadrant: Secondary | ICD-10-CM | POA: Diagnosis not present

## 2021-08-02 DIAGNOSIS — N62 Hypertrophy of breast: Secondary | ICD-10-CM | POA: Diagnosis not present

## 2021-08-02 DIAGNOSIS — N6011 Diffuse cystic mastopathy of right breast: Secondary | ICD-10-CM | POA: Diagnosis not present

## 2021-08-04 ENCOUNTER — Telehealth: Payer: Self-pay | Admitting: Hematology and Oncology

## 2021-08-04 NOTE — Telephone Encounter (Signed)
Scheduled appt per 5/23 staff msg from nurse navigator. Pt is aware of appt date and time. Pt is aware to arrive 15 mins prior to appt time and to bring and updated insurance card. Pt is aware of appt location.

## 2021-08-13 ENCOUNTER — Inpatient Hospital Stay: Payer: Medicare HMO

## 2021-08-13 ENCOUNTER — Inpatient Hospital Stay: Payer: Medicare HMO | Attending: Hematology and Oncology | Admitting: Hematology and Oncology

## 2021-08-13 ENCOUNTER — Telehealth: Payer: Self-pay | Admitting: *Deleted

## 2021-08-13 NOTE — Telephone Encounter (Signed)
Pt was a no show for scheduled new patient for MD for new breast issue per bx done 08/02/2021.   Note pt has hx of invasive breast cancer with localized recurrence but was a no show for follow up with Dr Jana Hakim 11/29/2019.   This RN called pt and left messages on both numbers- mobile and home- requesting a return call to discuss follow up care.  This RN remembers pt from her initial diagnosis and left that information as well on message.  This note will be forwarded to navigators per referral and MD pt was scheduled to see today.

## 2021-08-16 DIAGNOSIS — Z853 Personal history of malignant neoplasm of breast: Secondary | ICD-10-CM | POA: Diagnosis not present

## 2021-08-16 DIAGNOSIS — C50111 Malignant neoplasm of central portion of right female breast: Secondary | ICD-10-CM | POA: Diagnosis not present

## 2021-08-16 DIAGNOSIS — Z170 Estrogen receptor positive status [ER+]: Secondary | ICD-10-CM | POA: Diagnosis not present

## 2021-08-18 ENCOUNTER — Other Ambulatory Visit: Payer: Self-pay | Admitting: General Surgery

## 2021-08-18 DIAGNOSIS — C50111 Malignant neoplasm of central portion of right female breast: Secondary | ICD-10-CM

## 2021-08-18 DIAGNOSIS — Z853 Personal history of malignant neoplasm of breast: Secondary | ICD-10-CM

## 2021-08-19 ENCOUNTER — Other Ambulatory Visit (HOSPITAL_COMMUNITY): Payer: Self-pay | Admitting: General Surgery

## 2021-08-19 DIAGNOSIS — C50111 Malignant neoplasm of central portion of right female breast: Secondary | ICD-10-CM

## 2021-08-19 DIAGNOSIS — Z853 Personal history of malignant neoplasm of breast: Secondary | ICD-10-CM

## 2021-08-21 ENCOUNTER — Ambulatory Visit (HOSPITAL_COMMUNITY)
Admission: RE | Admit: 2021-08-21 | Discharge: 2021-08-21 | Disposition: A | Discharge status: Home / Self Care | Payer: Medicare HMO | Source: Ambulatory Visit | Attending: General Surgery | Admitting: General Surgery

## 2021-08-21 ENCOUNTER — Encounter (HOSPITAL_COMMUNITY): Payer: Self-pay

## 2021-08-21 DIAGNOSIS — Z170 Estrogen receptor positive status [ER+]: Secondary | ICD-10-CM

## 2021-08-21 DIAGNOSIS — Z853 Personal history of malignant neoplasm of breast: Secondary | ICD-10-CM

## 2021-08-24 ENCOUNTER — Telehealth: Payer: Self-pay | Admitting: Hematology and Oncology

## 2021-08-24 NOTE — Telephone Encounter (Signed)
Called pt to r/s missed new pt appt per 6/12 staff msg. No answer. Left msg for pt to call back to r/s appt.

## 2021-08-25 ENCOUNTER — Telehealth: Payer: Self-pay | Admitting: Hematology and Oncology

## 2021-08-25 NOTE — Telephone Encounter (Signed)
Called pt to r/s missed new pt appt per 6/12 staff msg. No answer. Left msg for pt to call back to r/s appt.

## 2021-08-27 ENCOUNTER — Telehealth: Payer: Self-pay | Admitting: Hematology and Oncology

## 2021-08-27 NOTE — Telephone Encounter (Signed)
Called pt to r/s missed new pt appt per 6/12 staff msg. No answer. Left msg for pt to call back to r/s appt.

## 2021-09-22 DIAGNOSIS — E1169 Type 2 diabetes mellitus with other specified complication: Secondary | ICD-10-CM | POA: Diagnosis not present

## 2021-09-22 DIAGNOSIS — C50911 Malignant neoplasm of unspecified site of right female breast: Secondary | ICD-10-CM | POA: Diagnosis not present

## 2021-10-04 ENCOUNTER — Other Ambulatory Visit: Payer: Self-pay

## 2021-10-13 ENCOUNTER — Encounter (HOSPITAL_COMMUNITY): Payer: Self-pay

## 2021-10-13 ENCOUNTER — Ambulatory Visit (HOSPITAL_COMMUNITY): Admission: RE | Admit: 2021-10-13 | Payer: Medicare HMO | Source: Ambulatory Visit

## 2021-10-14 ENCOUNTER — Other Ambulatory Visit: Payer: Self-pay

## 2021-10-18 ENCOUNTER — Other Ambulatory Visit: Payer: Self-pay | Admitting: General Surgery

## 2021-10-18 ENCOUNTER — Ambulatory Visit (HOSPITAL_COMMUNITY)
Admission: RE | Admit: 2021-10-18 | Discharge: 2021-10-18 | Disposition: A | Discharge status: Home / Self Care | Payer: Medicare HMO | Source: Ambulatory Visit | Attending: General Surgery | Admitting: General Surgery

## 2021-10-18 DIAGNOSIS — R9389 Abnormal findings on diagnostic imaging of other specified body structures: Secondary | ICD-10-CM

## 2021-10-18 DIAGNOSIS — Z170 Estrogen receptor positive status [ER+]: Secondary | ICD-10-CM | POA: Diagnosis not present

## 2021-10-18 DIAGNOSIS — Z1239 Encounter for other screening for malignant neoplasm of breast: Secondary | ICD-10-CM | POA: Diagnosis not present

## 2021-10-18 DIAGNOSIS — C50111 Malignant neoplasm of central portion of right female breast: Secondary | ICD-10-CM | POA: Insufficient documentation

## 2021-10-18 DIAGNOSIS — Z853 Personal history of malignant neoplasm of breast: Secondary | ICD-10-CM | POA: Diagnosis not present

## 2021-10-18 MED ORDER — GADOBUTROL 1 MMOL/ML IV SOLN
10.0000 mL | Freq: Once | INTRAVENOUS | Status: AC | PRN
  Administered 2021-10-18: 10 mL via INTRAVENOUS

## 2021-10-20 ENCOUNTER — Other Ambulatory Visit: Payer: Self-pay

## 2021-10-26 ENCOUNTER — Other Ambulatory Visit: Payer: Medicare HMO

## 2021-10-26 ENCOUNTER — Inpatient Hospital Stay: Admission: RE | Admit: 2021-10-26 | Payer: Medicare HMO | Source: Ambulatory Visit

## 2021-10-27 ENCOUNTER — Encounter: Payer: Self-pay | Admitting: *Deleted

## 2021-10-27 ENCOUNTER — Other Ambulatory Visit: Payer: Self-pay

## 2021-11-02 ENCOUNTER — Ambulatory Visit
Admission: RE | Admit: 2021-11-02 | Discharge: 2021-11-02 | Disposition: A | Discharge status: Home / Self Care | Payer: Medicare HMO | Source: Ambulatory Visit | Attending: General Surgery | Admitting: General Surgery

## 2021-11-02 ENCOUNTER — Other Ambulatory Visit: Payer: Self-pay | Admitting: General Surgery

## 2021-11-02 DIAGNOSIS — R590 Localized enlarged lymph nodes: Secondary | ICD-10-CM | POA: Diagnosis not present

## 2021-11-02 DIAGNOSIS — R9389 Abnormal findings on diagnostic imaging of other specified body structures: Secondary | ICD-10-CM

## 2021-11-09 ENCOUNTER — Telehealth: Payer: Self-pay | Admitting: Hematology and Oncology

## 2021-11-09 ENCOUNTER — Encounter: Payer: Self-pay | Admitting: *Deleted

## 2021-11-09 NOTE — Telephone Encounter (Signed)
Pt called back to r/s her missed new pt appt with Dr. Chryl Heck from June. I r/s appt. Pt is aware of new appt date/time.

## 2021-11-10 ENCOUNTER — Other Ambulatory Visit: Payer: Self-pay

## 2021-11-12 ENCOUNTER — Other Ambulatory Visit: Payer: Self-pay

## 2021-12-01 ENCOUNTER — Inpatient Hospital Stay: Payer: Medicare HMO

## 2021-12-01 ENCOUNTER — Encounter: Payer: Self-pay | Admitting: Hematology and Oncology

## 2021-12-01 ENCOUNTER — Inpatient Hospital Stay: Payer: Medicare HMO | Attending: Hematology and Oncology | Admitting: Hematology and Oncology

## 2021-12-01 ENCOUNTER — Other Ambulatory Visit: Payer: Self-pay

## 2021-12-01 VITALS — BP 134/81 | HR 69 | Temp 97.9°F | Resp 16 | Ht 62.0 in | Wt 310.1 lb

## 2021-12-01 DIAGNOSIS — C50511 Malignant neoplasm of lower-outer quadrant of right female breast: Secondary | ICD-10-CM | POA: Diagnosis not present

## 2021-12-01 DIAGNOSIS — D0511 Intraductal carcinoma in situ of right breast: Secondary | ICD-10-CM | POA: Diagnosis not present

## 2021-12-01 DIAGNOSIS — Z170 Estrogen receptor positive status [ER+]: Secondary | ICD-10-CM | POA: Insufficient documentation

## 2021-12-01 DIAGNOSIS — Z171 Estrogen receptor negative status [ER-]: Secondary | ICD-10-CM | POA: Diagnosis not present

## 2021-12-01 NOTE — Progress Notes (Signed)
[facility] NOTE  Patient Care Team: Jonathon Jordan, MD as PCP - General (Family Medicine) Magrinat, [name], MD (Inactive) as Consulting Physician (Oncology) Stark Klein, MD as Consulting Physician (General Surgery) Magrinat, [name], MD (Inactive) as Consulting Physician (Oncology) Gery Pray, MD as Consulting Physician (Radiation Oncology)  CHIEF COMPLAINTS/PURPOSE OF CONSULTATION:  Newly diagnosed breast cancer  HISTORY OF PRESENTING ILLNESS:   Kristina Rivera 61 y.o. female is here because of recent diagnosis of right breast DCIS  I reviewed her records extensively and collaborated the history with the patient.  SUMMARY OF ONCOLOGIC HISTORY: Oncology History  Malignant neoplasm of lower-outer quadrant of right breast of female, estrogen receptor negative ([place])  03/15/2018 Initial Diagnosis   Malignant neoplasm of lower-outer quadrant of right breast of female, estrogen receptor negative ([place])   04/03/2018 - 04/03/2018 Chemotherapy   The patient had dexamethasone (DECADRON) 4 MG tablet, 0 of 1 cycle, Start date: --, End date: -- DOXOrubicin (ADRIAMYCIN) chemo injection 148 mg, 60 mg/m2, Intravenous,  Once, 0 of 4 cycles PALONOSETRON HCL INJECTION 0.25 MG/5ML, 0.25 mg, Intravenous,  Once, 0 of 8 cycles pegfilgrastim-cbqv (UDENYCA) injection 6 mg, 6 mg, Subcutaneous, Once, 0 of 4 cycles CARBOplatin (PARAPLATIN) in sodium chloride 0.9 % 100 mL chemo infusion, , Intravenous,  Once, 0 of 4 cycles cyclophosphamide (CYTOXAN) 1,480 mg in sodium chloride 0.9 % 250 mL chemo infusion, 600 mg/m2, Intravenous,  Once, 0 of 4 cycles PACLitaxel (TAXOL) 198 mg in sodium chloride 0.9 % 250 mL chemo infusion (</= 18m/m2), 80 mg/m2, Intravenous,  Once, 0 of 4 cycles FOSAPREPITANT 150MG + DEXAMETHASONE INFUSION [facility], , Intravenous,  Once, 0 of 8 cycles  for chemotherapy treatment.    05/16/2018 -  Chemotherapy   The patient had dexamethasone (DECADRON) 4 MG tablet, 8  mg, Oral, Daily, 1 of 1 cycle, Start date: --, End date: -- palonosetron (ALOXI) injection 0.25 mg, 0.25 mg, Intravenous,  Once, 2 of 8 cycles Administration: 0.25 mg (05/16/2018), 0.25 mg (06/26/2018) methotrexate (PF) chemo injection 100 mg, 40.6 mg/m2 = 98.5 mg, Intravenous,  Once, 2 of 8 cycles Administration: 100 mg (05/16/2018), 100 mg (06/26/2018) cyclophosphamide (CYTOXAN) 1,480 mg in sodium chloride 0.9 % 250 mL chemo infusion, 600 mg/m2 = 1,480 mg, Intravenous,  Once, 2 of 8 cycles Administration: 1,480 mg (05/16/2018), 1,480 mg (06/26/2018) fluorouracil (ADRUCIL) chemo injection 1,500 mg, 600 mg/m2 = 1,500 mg, Intravenous,  Once, 2 of 8 cycles Administration: 1,500 mg (05/16/2018), 1,500 mg (06/26/2018)  for chemotherapy treatment.    08/01/2019 Cancer Staging   Staging form: Breast, AJCC 8th Edition - Pathologic stage from 08/01/2019: Stage IA (rpT1c, pN0, cM0, G3, ER+, PR-, HER2-) - Signed by CGardenia Phlegm NP on 08/14/2019    Interval History  Kristina Rivera here for follow-up.  She last saw Dr. MJana Hakimback in 2021.  In May 2023 she had some imaging which showed calcifications and asymmetry in the right breast.  Ultrasound of the axilla once again confirmed suspicious findings in the right breast.  She then had an MRI of the breast which showed no suspicious breast masses or convincing suspicious enhancement, asymmetric fibroglandular tissue with corresponding asymmetric enhancement, abnormal level 3 right axillary lymph node.  Ultrasound of the right axilla was unable to visualize the right retropectoral lymph node noted on the MRI and is considered to be a reactive lymph node, follow-up MRI in 6 months was recommended.  Pathology from the right breast needle core biopsy showed focal fibrocystic  changes no malignancy except for the retroareolar region which showed DCIS with calcifications and necrosis, high-grade, ER/PR positive strong staining 100% and 85% respectively.  She  followed up with Dr. Barry Dienes who recommended mastectomy since she has had recurrent breast cancer and since she does not want to consider radiation.  She has underlying radiosensitivity issues and reacts with headaches, palpitations, dizziness when she is closer to equipment using radiation  Since about 5 months ago, she started using an over-the-counter product calledASEA which according to the patient is nothing but sodium chloride but ionized in a manner that helps the cells heal and regenerate.  Since she started using this product, radiosensitivity has improved and she is able to drive now.  She is also able to sit in her room with computers  She is very stressed about need for mastectomy but is willing to go back to the surgeon and discuss further.  She otherwise feels well.   MEDICAL HISTORY:  Past Medical History:  Diagnosis Date   Anemia    Anxiety    pt denies   Arthritis    "back; knees; shoulders" (04/19/2013)   Asthma    onset as adult   Cancer [facility])    right breast cancer   Chronic lower back pain    Daily headache    "last couple months" (04/19/2013)   Depression    during a personal crisis   Family history of breast cancer    Family history of lung cancer    Heart murmur    as a child - no problems   History of kidney stones    Palpitations    Personal history of chemotherapy    PONV (postoperative nausea and vomiting)    couple of days of nausea after lumpectomy   Radiation disease    pt states she is very sensitive to electromagnetic things - makes her BP go very high, also causes Asthma symptoms     SURGICAL HISTORY: Past Surgical History:  Procedure Laterality Date   BREAST BIOPSY Right 02/2018   malignant   BREAST BIOPSY Left 02/2018   benign   BREAST LUMPECTOMY Right 03/2018   BREAST LUMPECTOMY WITH AXILLARY LYMPH NODE BIOPSY Right 03/28/2018   Procedure: RIGHT BREAST LUMPECTOMY WITH RIGHT SENTINEL LYMPH NODE BIOPSY;  Surgeon: Stark Klein, MD;  Location:  [place];  Service: General;  Laterality: Right;   BREAST LUMPECTOMY WITH SENTINEL LYMPH NODE BIOPSY Right 08/01/2019   Procedure: RIGHT BREAST LUMPECTOMY  BLUE DYE INJECTION;  Surgeon: Stark Klein, MD;  Location: [place];  Service: General;  Laterality: Right;   [place]?   PORT-A-CATH REMOVAL Left 10/18/2018   Procedure: REMOVAL PORT-A-CATH;  Surgeon: Stark Klein, MD;  Location: [place];  Service: General;  Laterality: Left;   PORTACATH PLACEMENT Left 05/14/2018   Procedure: INSERTION PORT-A-CATH;  Surgeon: Stark Klein, MD;  Location: [place];  Service: General;  Laterality: Left;   ruptured disc     Lumbar   SENTINEL NODE BIOPSY Right 08/01/2019   Procedure: Right Sentinel Node Biopsy;  Surgeon: Stark Klein, MD;  Location: [place];  Service: General;  Laterality: Right;   TUBAL LIGATION  1993    SOCIAL HISTORY: Social History   Socioeconomic History   Marital status: Married    Spouse name: Mariea Clonts   Number of children: 5   Years of education: Not on file   Highest education level: Not on file  Occupational History   Not on file  Tobacco Use   Smoking status: Never   Smokeless tobacco: Never  Vaping Use   Vaping Use: Never used  Substance and Sexual Activity   Alcohol use: No   Drug use: No   Sexual activity: Yes    Birth control/protection: Post-menopausal  Other Topics Concern   Not on file  Social History Narrative   Not on file   Social Determinants of Health   Financial Resource Strain: Not on file  Food Insecurity: Not on file  Transportation Needs: Not on file  Physical Activity: Not on file  Stress: Not on file  Social Connections: Not on file  Intimate Partner Violence: Not on file    FAMILY HISTORY: Family History  Problem Relation Age of Onset   Breast cancer Maternal Aunt 40   Pneumonia Mother    Lung cancer Mother 18   Stroke Father    Head & neck cancer Sister 30       neck   Prostate cancer Neg Hx     ALLERGIES:  is  allergic to aspirin, gadolinium derivatives, nsaids, and other.  MEDICATIONS:  Current Outpatient Medications  Medication Sig Dispense Refill   acetaminophen (TYLENOL) 650 MG CR tablet Take 650 mg by mouth every 8 (eight) hours as needed for pain.      albuterol (VENTOLIN HFA) 108 (90 BASE) MCG/ACT inhaler Inhale 2 puffs into the lungs as needed. (Patient taking differently: Inhale 2 puffs into the lungs every 6 (six) hours as needed for wheezing or shortness of breath. ) 1 Inhaler 6   fluticasone (FLONASE) 50 MCG/ACT nasal spray Place 1 spray into both nostrils daily as needed for allergies or rhinitis.     HYDROcodone-acetaminophen (NORCO/VICODIN) 5-325 MG tablet Take 1 tablet by mouth every 6 (six) hours as needed for severe pain. 12 tablet 0   letrozole (FEMARA) 2.5 MG tablet Take 1 tablet (2.5 mg total) by mouth daily. (Patient not taking: Reported on 09/18/2019) 90 tablet 4   Multiple Vitamins-Minerals (EMERGEN-C IMMUNE PLUS) PACK Take 1 Package by mouth daily.     OVER THE COUNTER MEDICATION Take 1 capsule by mouth daily. Chaga Mushroom (Patient not taking: Reported on 09/18/2019)     OVER THE COUNTER MEDICATION Take 1 capsule by mouth daily. Gano Mushroom (Patient not taking: Reported on 09/18/2019)     No current facility-administered medications for this visit.     PHYSICAL EXAMINATION: ECOG PERFORMANCE STATUS: 0 - Asymptomatic  Vitals:   12/01/21 1358  BP: 134/81  Pulse: 69  Resp: 16  Temp: 97.9 F (36.6 C)  SpO2: 96%   Filed Weights   12/01/21 1358  Weight: (!) 310 lb 1.6 oz (140.7 kg)    Physical exam deferred today in lieu of counseling  LABORATORY DATA:  I have reviewed the data as listed Lab Results  Component Value Date   WBC 11.4 (H) 11/10/2020   HGB 12.5 11/10/2020   HCT 42.5 11/10/2020   MCV 74.4 (L) 11/10/2020   PLT 295 11/10/2020   Lab Results  Component Value Date   NA 138 11/10/2020   K 4.4 11/10/2020   CL 101 11/10/2020   CO2 28 11/10/2020     RADIOGRAPHIC STUDIES: I have personally reviewed the radiological reports and agreed with the findings in the report.  ASSESSMENT AND PLAN:   ASSESSMENT: 61 y.o. [place] woman status post right breast lower outer quadrant biopsy 03/09/2018 for a clinical T2N0, stage IIB invasive ductal carcinoma, triple negative, with an MIB-1 of 80%   (  1) Genetic testing performed through [facility] Common Hereditary Cancers Panel + EGFR on 03/21/2018 showed no pathogenic mutations in APC, ATM, AXIN2, BARD1, BMPR1A, BRCA1, BRCA2, BRIP1, BUB1B, CDH1, CDK4, CDKN2A, CHEK2, CTNNA1, DICER1, ENG, EPCAM, GALNT12, GREM1, HOXB13, KIT, MEN1, MLH1, MLH3, MSH2, MSH3, MSH6, MUTYH, NBN, NF1, NTHL1, PALB2, PDGFRA, PMS2, POLD1, POLE, PTEN, RAD50, RAD51C, RAD51D, RNF43, RPS20, SDHA, SDHB, SDHC, SDHD, SMAD4, SMARCA4, STK11, TP53, TSC1, TSC2, VHL.              (a) A variant of uncertain significance (VUS) in a gene called ATM was also noted. [id number] ([id number]).   (2) status post right lumpectomy and sentinel lymph node sampling 03/28/2018 for a pT2 pN0, stage IIB invasive ductal carcinoma, with medullary features, negative margins.   (3) adjuvant chemotherapy consisting of cyclophosphamide, methotrexate and fluorouracil given every 21 days started 05/16/2018, discontinued after 2 cycles by patient (last dose 06/26/2018)   (3) patient opted against adjuvant radiation    (4) likely thalassemia (ferritin 250 on 03/16/2019 with MCV 73.5 and Hb 13.6)   LOCAL RECURRENCE RIGHT BREAST: (5) biopsy of a new clinically 0.5 cm right breast mass biopsied 05/20/2019 shows invasive ductal carcinoma, grade 3, estrogen receptor positive, HER-2 and progesterone receptor negative             (a) chest CT 06/21/2019 shows a slightly enlarged right axillary lymph node and a hypervascular lesion in the left liver measuring 1.7 cm; there were no worrisome lung or bone lesions             (b) bone scan 06/25/2019 shows possible spinal  lesions which however may well be degenerative changes; consideration of total spinal MRI             (c) breast MRI             (d) liver MRI   (6) anastrozole started 05/23/2019, discontinued after approximately 1 month with intolerable side effects             (b) exemestane started 07/01/2019   (7) status post right lumpectomy and axillary lymph node sampling 2019-08-01 for a pT1c pN0 invasive ductal carcinoma, grade 3, with negative margins             (a) additional 4 axillary nodes removed (5 nodes removed with 03/28/2018 surgery)  She did not show up to her follow-up appointments after this.  She tells me that she could not tolerate the antiestrogen therapy.  Her medication list shows letrozole however she has not been taking it. We once again discussed that the standard of care for DCIS is to consider surgery followed by radiation followed by adjuvant endocrine therapy.  However given her radiosensitivity and since she cannot consider radiation, Dr. Barry Dienes has recommended mastectomy.  She is very worried about the mastectomy.  I have also discussed about tamoxifen, mechanism of action and adverse effects.  I have given printed information about tamoxifen today to the patient.  I have encouraged her to meet Dr. Barry Dienes back and discuss about surgical options.  I have asked her to give [place] a call if she is willing to try antiestrogen therapy.  She expressed understanding of the recommendations.  I will make a tentative follow-up in about 8 weeks.   Total time spent: 45 minutes including history, review of records, counseling and coordination of care  All questions were answered. The patient knows to call the clinic with any problems, questions or concerns.    Benay Pike, MD 12/01/21

## 2021-12-02 ENCOUNTER — Encounter: Payer: Self-pay | Admitting: *Deleted

## 2021-12-02 ENCOUNTER — Other Ambulatory Visit: Payer: Self-pay

## 2021-12-03 ENCOUNTER — Other Ambulatory Visit: Payer: Self-pay

## 2021-12-08 ENCOUNTER — Encounter: Payer: Self-pay | Admitting: *Deleted

## 2021-12-29 ENCOUNTER — Encounter: Payer: Self-pay | Admitting: *Deleted

## 2022-01-05 DIAGNOSIS — H52223 Regular astigmatism, bilateral: Secondary | ICD-10-CM | POA: Diagnosis not present

## 2022-01-05 DIAGNOSIS — H5203 Hypermetropia, bilateral: Secondary | ICD-10-CM | POA: Diagnosis not present

## 2022-01-05 DIAGNOSIS — Z135 Encounter for screening for eye and ear disorders: Secondary | ICD-10-CM | POA: Diagnosis not present

## 2022-01-05 DIAGNOSIS — H2513 Age-related nuclear cataract, bilateral: Secondary | ICD-10-CM | POA: Diagnosis not present

## 2022-01-05 DIAGNOSIS — H524 Presbyopia: Secondary | ICD-10-CM | POA: Diagnosis not present

## 2022-01-05 DIAGNOSIS — H04123 Dry eye syndrome of bilateral lacrimal glands: Secondary | ICD-10-CM | POA: Diagnosis not present

## 2022-01-14 ENCOUNTER — Telehealth: Payer: Self-pay | Admitting: *Deleted

## 2022-01-14 NOTE — Patient Outreach (Signed)
  Care Coordination   01/14/2022 Name: Kristina Rivera MRN: [id number] DOB: 1960-08-09   Care Coordination Outreach Attempts:  An unsuccessful telephone outreach was attempted today to offer the patient information about available care coordination services as a benefit of their health plan.   Follow Up Plan:  Additional outreach attempts will be made to offer the patient care coordination information and services.   Encounter Outcome:  No Answer  Care Coordination Interventions Activated:  No   Care Coordination Interventions:  No, not indicated    Raina Mina, RN Care Management Coordinator [place] [facility] [phone]

## 2022-01-27 ENCOUNTER — Ambulatory Visit: Payer: Medicare HMO | Admitting: Hematology and Oncology

## 2022-01-27 ENCOUNTER — Encounter: Payer: Self-pay | Admitting: *Deleted

## 2022-01-27 NOTE — Progress Notes (Deleted)
[facility] NOTE  Patient Care Team: Jonathon Jordan, MD as PCP - General (Family Medicine) Magrinat, [name], MD (Inactive) as Consulting Physician (Oncology) Stark Klein, MD as Consulting Physician (General Surgery) Magrinat, [name], MD (Inactive) as Consulting Physician (Oncology) Gery Pray, MD as Consulting Physician (Radiation Oncology)  CHIEF COMPLAINTS/PURPOSE OF CONSULTATION:  Newly diagnosed breast cancer  HISTORY OF PRESENTING ILLNESS:   Kristina Rivera 61 y.o. female is here because of recent diagnosis of right breast DCIS  I reviewed her records extensively and collaborated the history with the patient.  SUMMARY OF ONCOLOGIC HISTORY: Oncology History  Malignant neoplasm of lower-outer quadrant of right breast of female, estrogen receptor negative ([place])  03/15/2018 Initial Diagnosis   Malignant neoplasm of lower-outer quadrant of right breast of female, estrogen receptor negative ([place])   04/03/2018 - 04/03/2018 Chemotherapy   The patient had dexamethasone (DECADRON) 4 MG tablet, 0 of 1 cycle, Start date: --, End date: -- DOXOrubicin (ADRIAMYCIN) chemo injection 148 mg, 60 mg/m2, Intravenous,  Once, 0 of 4 cycles PALONOSETRON HCL INJECTION 0.25 MG/5ML, 0.25 mg, Intravenous,  Once, 0 of 8 cycles pegfilgrastim-cbqv (UDENYCA) injection 6 mg, 6 mg, Subcutaneous, Once, 0 of 4 cycles CARBOplatin (PARAPLATIN) in sodium chloride 0.9 % 100 mL chemo infusion, , Intravenous,  Once, 0 of 4 cycles cyclophosphamide (CYTOXAN) 1,480 mg in sodium chloride 0.9 % 250 mL chemo infusion, 600 mg/m2, Intravenous,  Once, 0 of 4 cycles PACLitaxel (TAXOL) 198 mg in sodium chloride 0.9 % 250 mL chemo infusion (</= 18m/m2), 80 mg/m2, Intravenous,  Once, 0 of 4 cycles FOSAPREPITANT 150MG + DEXAMETHASONE INFUSION [facility], , Intravenous,  Once, 0 of 8 cycles  for chemotherapy treatment.    05/16/2018 -  Chemotherapy   The patient had dexamethasone (DECADRON) 4 MG tablet, 8  mg, Oral, Daily, 1 of 1 cycle, Start date: --, End date: -- palonosetron (ALOXI) injection 0.25 mg, 0.25 mg, Intravenous,  Once, 2 of 8 cycles Administration: 0.25 mg (05/16/2018), 0.25 mg (06/26/2018) methotrexate (PF) chemo injection 100 mg, 40.6 mg/m2 = 98.5 mg, Intravenous,  Once, 2 of 8 cycles Administration: 100 mg (05/16/2018), 100 mg (06/26/2018) cyclophosphamide (CYTOXAN) 1,480 mg in sodium chloride 0.9 % 250 mL chemo infusion, 600 mg/m2 = 1,480 mg, Intravenous,  Once, 2 of 8 cycles Administration: 1,480 mg (05/16/2018), 1,480 mg (06/26/2018) fluorouracil (ADRUCIL) chemo injection 1,500 mg, 600 mg/m2 = 1,500 mg, Intravenous,  Once, 2 of 8 cycles Administration: 1,500 mg (05/16/2018), 1,500 mg (06/26/2018)  for chemotherapy treatment.    08/01/2019 Cancer Staging   Staging form: Breast, AJCC 8th Edition - Pathologic stage from 08/01/2019: Stage IA (rpT1c, pN0, cM0, G3, ER+, PR-, HER2-) - Signed by CGardenia Phlegm NP on 08/14/2019    Interval History  [name] here for follow-up.  She last saw Dr. MJana Hakimback in 2021.  In May 2023 she had some imaging which showed calcifications and asymmetry in the right breast.  Ultrasound of the axilla once again confirmed suspicious findings in the right breast.  She then had an MRI of the breast which showed no suspicious breast masses or convincing suspicious enhancement, asymmetric fibroglandular tissue with corresponding asymmetric enhancement, abnormal level 3 right axillary lymph node.  Ultrasound of the right axilla was unable to visualize the right retropectoral lymph node noted on the MRI and is considered to be a reactive lymph node, follow-up MRI in 6 months was recommended.  Pathology from the right breast needle core biopsy showed focal fibrocystic  changes no malignancy except for the retroareolar region which showed DCIS with calcifications and necrosis, high-grade, ER/PR positive strong staining 100% and 85% respectively.  She  followed up with Dr. Barry Dienes who recommended mastectomy since she has had recurrent breast cancer and since she does not want to consider radiation.  She has underlying radiosensitivity issues and reacts with headaches, palpitations, dizziness when she is closer to equipment using radiation  Since about 5 months ago, she started using an over-the-counter product calledASEA which according to the patient is nothing but sodium chloride but ionized in a manner that helps the cells heal and regenerate.  Since she started using this product, radiosensitivity has improved and she is able to drive now.  She is also able to sit in her room with computers  She is very stressed about need for mastectomy but is willing to go back to the surgeon and discuss further.  She otherwise feels well.   MEDICAL HISTORY:  Past Medical History:  Diagnosis Date   Anemia    Anxiety    pt denies   Arthritis    "back; knees; shoulders" (04/19/2013)   Asthma    onset as adult   Cancer [facility])    right breast cancer   Chronic lower back pain    Daily headache    "last couple months" (04/19/2013)   Depression    during a personal crisis   Family history of breast cancer    Family history of lung cancer    Heart murmur    as a child - no problems   History of kidney stones    Palpitations    Personal history of chemotherapy    PONV (postoperative nausea and vomiting)    couple of days of nausea after lumpectomy   Radiation disease    pt states she is very sensitive to electromagnetic things - makes her BP go very high, also causes Asthma symptoms     SURGICAL HISTORY: Past Surgical History:  Procedure Laterality Date   BREAST BIOPSY Right 02/2018   malignant   BREAST BIOPSY Left 02/2018   benign   BREAST LUMPECTOMY Right 03/2018   BREAST LUMPECTOMY WITH AXILLARY LYMPH NODE BIOPSY Right 03/28/2018   Procedure: RIGHT BREAST LUMPECTOMY WITH RIGHT SENTINEL LYMPH NODE BIOPSY;  Surgeon: Stark Klein, MD;  Location:  [place];  Service: General;  Laterality: Right;   BREAST LUMPECTOMY WITH SENTINEL LYMPH NODE BIOPSY Right 08/01/2019   Procedure: RIGHT BREAST LUMPECTOMY  BLUE DYE INJECTION;  Surgeon: Stark Klein, MD;  Location: [place];  Service: General;  Laterality: Right;   [place]?   PORT-A-CATH REMOVAL Left 10/18/2018   Procedure: REMOVAL PORT-A-CATH;  Surgeon: Stark Klein, MD;  Location: [place];  Service: General;  Laterality: Left;   PORTACATH PLACEMENT Left 05/14/2018   Procedure: INSERTION PORT-A-CATH;  Surgeon: Stark Klein, MD;  Location: [place];  Service: General;  Laterality: Left;   ruptured disc     Lumbar   SENTINEL NODE BIOPSY Right 08/01/2019   Procedure: Right Sentinel Node Biopsy;  Surgeon: Stark Klein, MD;  Location: [place];  Service: General;  Laterality: Right;   TUBAL LIGATION  1993    SOCIAL HISTORY: Social History   Socioeconomic History   Marital status: Married    Spouse name: Mariea Clonts   Number of children: 5   Years of education: Not on file   Highest education level: Not on file  Occupational History   Not on file  Tobacco Use   Smoking status: Never   Smokeless tobacco: Never  Vaping Use   Vaping Use: Never used  Substance and Sexual Activity   Alcohol use: No   Drug use: No   Sexual activity: Yes    Birth control/protection: Post-menopausal  Other Topics Concern   Not on file  Social History Narrative   Not on file   Social Determinants of Health   Financial Resource Strain: Not on file  Food Insecurity: Not on file  Transportation Needs: Not on file  Physical Activity: Not on file  Stress: Not on file  Social Connections: Not on file  Intimate Partner Violence: Not on file    FAMILY HISTORY: Family History  Problem Relation Age of Onset   Breast cancer Maternal Aunt 85   Pneumonia Mother    Lung cancer Mother 53   Stroke Father    Head & neck cancer Sister 30       neck   Prostate cancer Neg Hx     ALLERGIES:  is  allergic to aspirin, gadolinium derivatives, nsaids, and other.  MEDICATIONS:  Current Outpatient Medications  Medication Sig Dispense Refill   acetaminophen (TYLENOL) 650 MG CR tablet Take 650 mg by mouth every 8 (eight) hours as needed for pain.      albuterol (VENTOLIN HFA) 108 (90 BASE) MCG/ACT inhaler Inhale 2 puffs into the lungs as needed. (Patient taking differently: Inhale 2 puffs into the lungs every 6 (six) hours as needed for wheezing or shortness of breath. ) 1 Inhaler 6   fluticasone (FLONASE) 50 MCG/ACT nasal spray Place 1 spray into both nostrils daily as needed for allergies or rhinitis.     HYDROcodone-acetaminophen (NORCO/VICODIN) 5-325 MG tablet Take 1 tablet by mouth every 6 (six) hours as needed for severe pain. 12 tablet 0   letrozole (FEMARA) 2.5 MG tablet Take 1 tablet (2.5 mg total) by mouth daily. (Patient not taking: Reported on 09/18/2019) 90 tablet 4   Multiple Vitamins-Minerals (EMERGEN-C IMMUNE PLUS) PACK Take 1 Package by mouth daily.     OVER THE COUNTER MEDICATION Take 1 capsule by mouth daily. Chaga Mushroom (Patient not taking: Reported on 09/18/2019)     OVER THE COUNTER MEDICATION Take 1 capsule by mouth daily. Gano Mushroom (Patient not taking: Reported on 09/18/2019)     No current facility-administered medications for this visit.     PHYSICAL EXAMINATION: ECOG PERFORMANCE STATUS: 0 - Asymptomatic  There were no vitals filed for this visit.  There were no vitals filed for this visit.   Physical exam deferred today in lieu of counseling  LABORATORY DATA:  I have reviewed the data as listed Lab Results  Component Value Date   WBC 11.4 (H) 11/10/2020   HGB 12.5 11/10/2020   HCT 42.5 11/10/2020   MCV 74.4 (L) 11/10/2020   PLT 295 11/10/2020   Lab Results  Component Value Date   NA 138 11/10/2020   K 4.4 11/10/2020   CL 101 11/10/2020   CO2 28 11/10/2020    RADIOGRAPHIC STUDIES: I have personally reviewed the radiological reports and agreed  with the findings in the report.  ASSESSMENT AND PLAN:   ASSESSMENT:   61 y.o. [place] woman status post right breast lower outer quadrant biopsy 03/09/2018 for a clinical T2N0, stage IIB invasive ductal carcinoma, triple negative, with an MIB-1 of 80%   (1) Genetic testing performed through [facility] Common Hereditary Cancers Panel + EGFR on 03/21/2018 showed no pathogenic mutations in APC,  ATM, AXIN2, BARD1, BMPR1A, BRCA1, BRCA2, BRIP1, BUB1B, CDH1, CDK4, CDKN2A, CHEK2, CTNNA1, DICER1, ENG, EPCAM, GALNT12, GREM1, HOXB13, KIT, MEN1, MLH1, MLH3, MSH2, MSH3, MSH6, MUTYH, NBN, NF1, NTHL1, PALB2, PDGFRA, PMS2, POLD1, POLE, PTEN, RAD50, RAD51C, RAD51D, RNF43, RPS20, SDHA, SDHB, SDHC, SDHD, SMAD4, SMARCA4, STK11, TP53, TSC1, TSC2, VHL.              (a) A variant of uncertain significance (VUS) in a gene called ATM was also noted. c.4082A>G (p.[id number]).   (2) status post right lumpectomy and sentinel lymph node sampling 03/28/2018 for a pT2 pN0, stage IIB invasive ductal carcinoma, with medullary features, negative margins.   (3) adjuvant chemotherapy consisting of cyclophosphamide, methotrexate and fluorouracil given every 21 days started 05/16/2018, discontinued after 2 cycles by patient (last dose 06/26/2018)   (3) patient opted against adjuvant radiation    (4) likely thalassemia (ferritin 250 on 03/16/2019 with MCV 73.5 and Hb 13.6)   LOCAL RECURRENCE RIGHT BREAST: (5) biopsy of a new clinically 0.5 cm right breast mass biopsied 05/20/2019 shows invasive ductal carcinoma, grade 3, estrogen receptor positive, HER-2 and progesterone receptor negative             (a) chest CT 06/21/2019 shows a slightly enlarged right axillary lymph node and a hypervascular lesion in the left liver measuring 1.7 cm; there were no worrisome lung or bone lesions             (b) bone scan 06/25/2019 shows possible spinal lesions which however may well be degenerative changes; consideration of total spinal MRI              (c) breast MRI             (d) liver MRI   (6) anastrozole started 05/23/2019, discontinued after approximately 1 month with intolerable side effects             (b) exemestane started 07/01/2019   (7) status post right lumpectomy and axillary lymph node sampling 2019-08-01 for a pT1c pN0 invasive ductal carcinoma, grade 3, with negative margins             (a) additional 4 axillary nodes removed (5 nodes removed with 03/28/2018 surgery)  She did not show up to her follow-up appointments after this.  She tells me that she could not tolerate the antiestrogen therapy.    (8) In May 2023 she had some imaging which showed calcifications and asymmetry in the right breast.  Ultrasound of the axilla once again confirmed suspicious findings in the right breast.  She then had an MRI of the breast which showed no suspicious breast masses or convincing suspicious enhancement, asymmetric fibroglandular tissue with corresponding asymmetric enhancement, abnormal level 3 right axillary lymph node.  Ultrasound of the right axilla was unable to visualize the right retropectoral lymph node noted on the MRI and is considered to be a reactive lymph node, follow-up MRI in 6 months was recommended.  Pathology from the right breast needle core biopsy showed focal fibrocystic changes no malignancy except for the retroareolar region which showed DCIS with calcifications and necrosis, high-grade, ER/PR positive strong staining 100% and 85% respectively  During her last visit, we have discussed about mastectomy since she is very radiosensitive followed by consideration for tamoxifen.  I have clearly discussed about role of tamoxifen, mechanism of action, adverse effects from tamoxifen, gave her printed information about tamoxifen and she is here for follow-up.  Total time spent: ** minutes including history, review of records, counseling  and coordination of care  All questions were answered. The patient knows to  call the clinic with any problems, questions or concerns.    Benay Pike, MD 01/27/22

## 2022-03-18 ENCOUNTER — Other Ambulatory Visit: Payer: Self-pay | Admitting: General Surgery

## 2022-03-18 DIAGNOSIS — Z1231 Encounter for screening mammogram for malignant neoplasm of breast: Secondary | ICD-10-CM

## 2022-03-20 ENCOUNTER — Other Ambulatory Visit: Payer: Self-pay

## 2022-03-25 ENCOUNTER — Other Ambulatory Visit: Payer: Self-pay | Admitting: General Surgery

## 2022-03-25 DIAGNOSIS — C50111 Malignant neoplasm of central portion of right female breast: Secondary | ICD-10-CM | POA: Diagnosis not present

## 2022-03-25 DIAGNOSIS — Z170 Estrogen receptor positive status [ER+]: Secondary | ICD-10-CM | POA: Diagnosis not present

## 2022-03-30 ENCOUNTER — Other Ambulatory Visit: Payer: Self-pay | Admitting: General Surgery

## 2022-03-30 DIAGNOSIS — C50111 Malignant neoplasm of central portion of right female breast: Secondary | ICD-10-CM

## 2022-04-06 ENCOUNTER — Ambulatory Visit
Admission: RE | Admit: 2022-04-06 | Discharge: 2022-04-06 | Disposition: A | Discharge status: Home / Self Care | Payer: Medicare HMO | Source: Ambulatory Visit | Attending: General Surgery | Admitting: General Surgery

## 2022-04-06 DIAGNOSIS — R928 Other abnormal and inconclusive findings on diagnostic imaging of breast: Secondary | ICD-10-CM | POA: Diagnosis not present

## 2022-04-06 DIAGNOSIS — Z170 Estrogen receptor positive status [ER+]: Secondary | ICD-10-CM

## 2022-04-06 DIAGNOSIS — Z853 Personal history of malignant neoplasm of breast: Secondary | ICD-10-CM | POA: Diagnosis not present

## 2022-04-07 DIAGNOSIS — Z0000 Encounter for general adult medical examination without abnormal findings: Secondary | ICD-10-CM | POA: Diagnosis not present

## 2022-04-07 DIAGNOSIS — M1990 Unspecified osteoarthritis, unspecified site: Secondary | ICD-10-CM | POA: Diagnosis not present

## 2022-04-07 DIAGNOSIS — E559 Vitamin D deficiency, unspecified: Secondary | ICD-10-CM | POA: Diagnosis not present

## 2022-04-07 DIAGNOSIS — Z79899 Other long term (current) drug therapy: Secondary | ICD-10-CM | POA: Diagnosis not present

## 2022-04-07 DIAGNOSIS — E1169 Type 2 diabetes mellitus with other specified complication: Secondary | ICD-10-CM | POA: Diagnosis not present

## 2022-04-07 DIAGNOSIS — F330 Major depressive disorder, recurrent, mild: Secondary | ICD-10-CM | POA: Diagnosis not present

## 2022-04-07 DIAGNOSIS — I270 Primary pulmonary hypertension: Secondary | ICD-10-CM | POA: Diagnosis not present

## 2022-04-07 DIAGNOSIS — Z01818 Encounter for other preprocedural examination: Secondary | ICD-10-CM | POA: Diagnosis not present

## 2022-04-07 DIAGNOSIS — I10 Essential (primary) hypertension: Secondary | ICD-10-CM | POA: Diagnosis not present

## 2022-04-07 DIAGNOSIS — C50911 Malignant neoplasm of unspecified site of right female breast: Secondary | ICD-10-CM | POA: Diagnosis not present

## 2022-04-12 DIAGNOSIS — Z1211 Encounter for screening for malignant neoplasm of colon: Secondary | ICD-10-CM | POA: Diagnosis not present

## 2022-04-22 ENCOUNTER — Other Ambulatory Visit: Payer: Self-pay | Admitting: General Surgery

## 2022-04-22 DIAGNOSIS — Z170 Estrogen receptor positive status [ER+]: Secondary | ICD-10-CM

## 2022-05-16 ENCOUNTER — Other Ambulatory Visit (HOSPITAL_COMMUNITY): Payer: Self-pay | Admitting: General Surgery

## 2022-05-16 DIAGNOSIS — Z170 Estrogen receptor positive status [ER+]: Secondary | ICD-10-CM

## 2022-05-25 ENCOUNTER — Inpatient Hospital Stay: Admission: RE | Admit: 2022-05-25 | Payer: Medicare HMO | Source: Ambulatory Visit

## 2022-05-27 ENCOUNTER — Ambulatory Visit (HOSPITAL_COMMUNITY): Payer: Medicare HMO

## 2022-05-27 ENCOUNTER — Encounter (HOSPITAL_COMMUNITY): Payer: Self-pay

## 2022-06-11 ENCOUNTER — Ambulatory Visit (HOSPITAL_COMMUNITY)
Admission: RE | Admit: 2022-06-11 | Discharge: 2022-06-11 | Disposition: A | Discharge status: Home / Self Care | Payer: Medicare HMO | Source: Ambulatory Visit | Attending: General Surgery | Admitting: General Surgery

## 2022-06-11 DIAGNOSIS — Z170 Estrogen receptor positive status [ER+]: Secondary | ICD-10-CM

## 2022-06-12 NOTE — Progress Notes (Signed)
Pt came for MRI today. Pt has known contrast allergy and has successfully followed pre-medication protocol for prior MRIs. Pt stated her allergy included hives on her neck, throat swelling and shortness of breath needing emergent treatment. Pt stated today she didn't follow full pre-medication dosing due to prednisone making her feel bad. Pt stated she only took one of three prescribed doses of prednisone for the pre-medication protocol. Radiologist was called to discuss safety. Spoke with Dr. Lin Landsman who advised pt reschedule appt and follow full pre-med dosing before receiving contrast. Pt verbalized understanding of situation.

## 2022-06-14 ENCOUNTER — Telehealth: Payer: Self-pay

## 2022-06-14 NOTE — Patient Outreach (Signed)
  Care Coordination   06/14/2022 Name: Kristina Rivera MRN: [id number] DOB: 06/28/1960   Care Coordination Outreach Attempts:  An unsuccessful telephone outreach was attempted today to offer the patient information about available care coordination services as a benefit of their health plan.   Follow Up Plan:  Additional outreach attempts will be made to offer the patient care coordination information and services.   Encounter Outcome:  No Answer   Care Coordination Interventions:  No, not indicated    Daneen Schick, BSW, CDP Social Worker, Certified Dementia Practitioner [place] [facility]  Care Coordination [phone]

## 2022-06-20 ENCOUNTER — Ambulatory Visit: Payer: Medicare HMO

## 2022-06-20 ENCOUNTER — Telehealth: Payer: Self-pay

## 2022-06-20 NOTE — Patient Outreach (Signed)
  Care Coordination   06/20/2022 Name: Kristina Rivera MRN: [id number] DOB: 1960/09/15   Care Coordination Outreach Attempts:  A second unsuccessful outreach was attempted today to offer the patient with information about available care coordination services as a benefit of their health plan.     Follow Up Plan:  Additional outreach attempts will be made to offer the patient care coordination information and services.   Encounter Outcome:  Pt. Request to Call Back   Care Coordination Interventions:  No, not indicated    Bevelyn Ngo, BSW, CDP Social Worker, Certified Dementia Practitioner [facility] Care Management  Care Coordination [phone]

## 2022-06-22 ENCOUNTER — Other Ambulatory Visit: Payer: Self-pay | Admitting: General Surgery

## 2022-06-22 DIAGNOSIS — Z853 Personal history of malignant neoplasm of breast: Secondary | ICD-10-CM

## 2022-06-27 ENCOUNTER — Ambulatory Visit: Payer: Self-pay

## 2022-06-27 NOTE — Patient Instructions (Signed)
Visit Information  Thank you for taking time to visit with me today. Please don't hesitate to contact me if I can be of assistance to you.   Following are the goals we discussed today:  -Review food resources and contact SW as needed   Your next appointment is by telephone on 4/22 at 2:00 pm with Fleeta Emmer  Please call the care guide team at [phone] if you need to cancel or reschedule your appointment.   If you are experiencing a Mental Health or Behavioral Health Crisis or need someone to talk to, please go to [facility] [address] [place] [phone])  Patient verbalizes understanding of instructions and care plan provided today and agrees to view in MyChart. Active MyChart status and patient understanding of how to access instructions and care plan via MyChart confirmed with patient.     Bevelyn Ngo, BSW, CDP Social Worker, Certified Dementia Practitioner [facility] Care Management  Care Coordination [phone]

## 2022-06-27 NOTE — Patient Outreach (Signed)
  Care Coordination   Initial Visit Note   06/27/2022 Name: Kristina Rivera MRN: [id number] DOB: [birth year]  Kristina Rivera is a 62 y.o. year old female who sees Mila Palmer, MD for primary care. I spoke with  [facility] by phone today.  What matters to the patients health and wellness today?  Patient would like information of food resources    Goals Addressed             This Visit's Progress    COMPLETED: Care Coordination Activities       Care Coordination Interventions: SDoH screening performed - identified challenges with food resources. Patient indicates she currently receives $11 per month from [employer] but would like other resources Provided the patient with a list of [employer] as well as the link to the [employer] site via e-mail per patients request Determined the patient does not have concerns with medication costs at this time; education provided on role of care coordination pharmacy team member who can assist as needed with medication related concerns Educated the patient on the role of the Medical illustrator - patient agreeable to a call with RN Fleeta Emmer, visit scheduled Advised the patient to contact SW as needed Collaboration with RN Care Manager to advise of interventions and plan for patient to engage with her during scheduled visit         SDOH assessments and interventions completed:  Yes  SDOH Interventions Today    Flowsheet Row Most Recent Value  SDOH Interventions   Food Insecurity Interventions Other (Comment)  [has [employer] - will send info on food pantries]  Transportation Interventions Intervention Not Indicated        Care Coordination Interventions:  Yes, provided   Interventions Today    Flowsheet Row Most Recent Value  Chronic Disease   Chronic disease during today's visit Hypertension (HTN), Other  [Malignant Neoplasm of Lower-Outer Quadrant of Right Breast]  General Interventions   General  Interventions Discussed/Reviewed Community Resources, Referral to Nurse  Doctor Visits Discussed/Reviewed Doctor Visits Reviewed  Nutrition Interventions   Nutrition Discussed/Reviewed Nutrition Discussed        Follow up plan: Follow up call scheduled for 4/22 at 2pm with RN Care Manager    Encounter Outcome:  Pt. Visit Completed   Bevelyn Ngo, Kenard Gower, CDP Social Worker, Certified Dementia Practitioner [facility] Care Management  Care Coordination [phone]

## 2022-06-28 ENCOUNTER — Other Ambulatory Visit: Payer: Self-pay

## 2022-07-04 ENCOUNTER — Ambulatory Visit: Payer: Self-pay

## 2022-07-04 NOTE — Patient Outreach (Signed)
  Care Coordination   07/04/2022 Name: Kristina Rivera MRN: [id number] DOB: [birth year]   Care Coordination Outreach Attempts:  An unsuccessful telephone outreach was attempted today to offer the patient information about available care coordination services as a benefit of their health plan.   Follow Up Plan:  Additional outreach attempts will be made to offer the patient care coordination information and services.   Encounter Outcome:  No Answer   Care Coordination Interventions:  No, not indicated    Bary Leriche, RN, MSN [facility] Care Management Care Management Coordinator Direct Line [phone]

## 2022-07-12 ENCOUNTER — Other Ambulatory Visit: Payer: Self-pay

## 2022-07-16 ENCOUNTER — Other Ambulatory Visit: Payer: Self-pay

## 2022-07-19 ENCOUNTER — Ambulatory Visit: Payer: Self-pay

## 2022-07-19 NOTE — Patient Outreach (Signed)
  Care Coordination   07/19/2022 Name: Mellanie Altschuler MRN: [id number] DOB: 1960/10/19   Care Coordination Outreach Attempts:  A second unsuccessful outreach was attempted today to offer the patient with information about available care coordination services.  Follow Up Plan:  Additional outreach attempts will be made to offer the patient care coordination information and services.   Encounter Outcome:  No Answer   Care Coordination Interventions:  No, not indicated    Bary Leriche, RN, MSN [facility] Care Management Care Management Coordinator Direct Line [phone]

## 2022-07-20 ENCOUNTER — Telehealth: Payer: Self-pay

## 2022-07-20 NOTE — Patient Outreach (Signed)
  Care Coordination   07/20/2022 Name: Kristina Rivera MRN: [id number] DOB: 10-31-1960   Care Coordination Outreach Attempts:  A third unsuccessful outreach was attempted today to offer the patient with information about available care coordination services.  Follow Up Plan:  No further outreach attempts will be made at this time. We have been unable to contact the patient to offer or enroll patient in care coordination services  Encounter Outcome:  No Answer   Care Coordination Interventions:  No, not indicated   Kristina Leriche, RN, MSN [facility] Care Management Care Management Coordinator Direct Line [phone]

## 2022-07-26 ENCOUNTER — Telehealth: Payer: Self-pay

## 2022-07-26 NOTE — Patient Outreach (Signed)
  Care Coordination   07/26/2022 Name: Kristina Rivera MRN: [id number] DOB: 1961-03-06   Care Coordination Outreach Attempts:  A third unsuccessful outreach was attempted today to offer the patient with information about available care coordination services.  Follow Up Plan:  No further outreach attempts will be made at this time. We have been unable to contact the patient to offer or enroll patient in care coordination services  Encounter Outcome:  No Answer   Care Coordination Interventions:  No, not indicated    Bary Leriche, RN, MSN [facility] Care Management Care Management Coordinator Direct Line [phone]

## 2022-08-23 ENCOUNTER — Ambulatory Visit
Admission: RE | Admit: 2022-08-23 | Discharge: 2022-08-23 | Disposition: A | Discharge status: Home / Self Care | Payer: Medicare HMO | Source: Ambulatory Visit | Attending: General Surgery | Admitting: General Surgery

## 2022-08-23 DIAGNOSIS — Z853 Personal history of malignant neoplasm of breast: Secondary | ICD-10-CM

## 2022-08-24 ENCOUNTER — Other Ambulatory Visit: Payer: Self-pay

## 2023-01-02 ENCOUNTER — Emergency Department (HOSPITAL_BASED_OUTPATIENT_CLINIC_OR_DEPARTMENT_OTHER)
Admission: EM | Admit: 2023-01-02 | Discharge: 2023-01-03 | Disposition: A | Discharge status: Home / Self Care | Payer: Medicare HMO | Attending: Emergency Medicine | Admitting: Emergency Medicine

## 2023-01-02 ENCOUNTER — Other Ambulatory Visit: Payer: Self-pay

## 2023-01-02 ENCOUNTER — Emergency Department (HOSPITAL_BASED_OUTPATIENT_CLINIC_OR_DEPARTMENT_OTHER): Payer: Medicare HMO

## 2023-01-02 ENCOUNTER — Encounter (HOSPITAL_BASED_OUTPATIENT_CLINIC_OR_DEPARTMENT_OTHER): Payer: Self-pay | Admitting: Urology

## 2023-01-02 DIAGNOSIS — Z853 Personal history of malignant neoplasm of breast: Secondary | ICD-10-CM | POA: Insufficient documentation

## 2023-01-02 DIAGNOSIS — K5732 Diverticulitis of large intestine without perforation or abscess without bleeding: Secondary | ICD-10-CM | POA: Insufficient documentation

## 2023-01-02 DIAGNOSIS — R1032 Left lower quadrant pain: Secondary | ICD-10-CM | POA: Diagnosis not present

## 2023-01-02 DIAGNOSIS — D72829 Elevated white blood cell count, unspecified: Secondary | ICD-10-CM | POA: Insufficient documentation

## 2023-01-02 DIAGNOSIS — K5792 Diverticulitis of intestine, part unspecified, without perforation or abscess without bleeding: Secondary | ICD-10-CM | POA: Diagnosis not present

## 2023-01-02 DIAGNOSIS — R109 Unspecified abdominal pain: Secondary | ICD-10-CM | POA: Diagnosis present

## 2023-01-02 DIAGNOSIS — R935 Abnormal findings on diagnostic imaging of other abdominal regions, including retroperitoneum: Secondary | ICD-10-CM | POA: Diagnosis not present

## 2023-01-02 DIAGNOSIS — D251 Intramural leiomyoma of uterus: Secondary | ICD-10-CM | POA: Diagnosis not present

## 2023-01-02 DIAGNOSIS — Z6841 Body Mass Index (BMI) 40.0 and over, adult: Secondary | ICD-10-CM | POA: Diagnosis not present

## 2023-01-02 DIAGNOSIS — K8020 Calculus of gallbladder without cholecystitis without obstruction: Secondary | ICD-10-CM | POA: Diagnosis not present

## 2023-01-02 LAB — COMPREHENSIVE METABOLIC PANEL
ALT: 15 U/L (ref 0–44)
AST: 24 U/L (ref 15–41)
Albumin: 4.3 g/dL (ref 3.5–5.0)
Alkaline Phosphatase: 58 U/L (ref 38–126)
Anion gap: 4 — ABNORMAL LOW (ref 5–15)
BUN: 8 mg/dL (ref 8–23)
CO2: 35 mmol/L — ABNORMAL HIGH (ref 22–32)
Calcium: 10 mg/dL (ref 8.9–10.3)
Chloride: 101 mmol/L (ref 98–111)
Creatinine, Ser: 0.94 mg/dL (ref 0.44–1.00)
GFR, Estimated: >60 mL/min (ref >60)
Glucose, Bld: 92 mg/dL (ref 70–99)
Potassium: 4.4 mmol/L (ref 3.5–5.1)
Sodium: 140 mmol/L (ref 135–145)
Total Bilirubin: 0.5 mg/dL (ref 0.3–1.2)
Total Protein: 8.4 g/dL — ABNORMAL HIGH (ref 6.5–8.1)

## 2023-01-02 LAB — URINALYSIS, ROUTINE W REFLEX MICROSCOPIC
Bilirubin Urine: NEGATIVE
Glucose, UA: NEGATIVE mg/dL
Hgb urine dipstick: NEGATIVE
Ketones, ur: NEGATIVE mg/dL
Leukocytes,Ua: NEGATIVE
Nitrite: NEGATIVE
Specific Gravity, Urine: >1.046 — ABNORMAL HIGH (ref 1.005–1.030)
pH: 6 (ref 5.0–8.0)

## 2023-01-02 LAB — LIPASE, BLOOD: Lipase: 43 U/L (ref 11–51)

## 2023-01-02 LAB — CBC
HCT: 41.3 % (ref 36.0–46.0)
Hemoglobin: 12.4 g/dL (ref 12.0–15.0)
MCH: 21.8 pg — ABNORMAL LOW (ref 26.0–34.0)
MCHC: 30 g/dL (ref 30.0–36.0)
MCV: 72.5 fL — ABNORMAL LOW (ref 80.0–100.0)
Platelets: 360 10*3/uL (ref 150–400)
RBC: 5.7 MIL/uL — ABNORMAL HIGH (ref 3.87–5.11)
RDW: 15.4 % (ref 11.5–15.5)
WBC: 12.6 10*3/uL — ABNORMAL HIGH (ref 4.0–10.5)
nRBC: 0 % (ref 0.0–0.2)

## 2023-01-02 MED ORDER — ONDANSETRON HCL 4 MG/2ML IJ SOLN
4.0000 mg | Freq: Once | INTRAMUSCULAR | Status: AC
  Administered 2023-01-02: 4 mg via INTRAVENOUS
  Filled 2023-01-02: qty 2

## 2023-01-02 MED ORDER — MORPHINE SULFATE (PF) 4 MG/ML IV SOLN
4.0000 mg | Freq: Once | INTRAVENOUS | Status: AC
  Administered 2023-01-02: 4 mg via INTRAVENOUS
  Filled 2023-01-02: qty 1

## 2023-01-02 MED ORDER — IOHEXOL 300 MG/ML  SOLN
100.0000 mL | Freq: Once | INTRAMUSCULAR | Status: AC | PRN
  Administered 2023-01-02: 100 mL via INTRAVENOUS

## 2023-01-02 MED ORDER — AMOXICILLIN-POT CLAVULANATE 875-125 MG PO TABS: 1.0000 | ORAL_TABLET | Freq: Two times a day (BID) | ORAL | 0 refills | Status: AC

## 2023-01-02 MED ORDER — AMOXICILLIN-POT CLAVULANATE 875-125 MG PO TABS
1.0000 | ORAL_TABLET | Freq: Once | ORAL | Status: AC
  Administered 2023-01-02: 1 via ORAL
  Filled 2023-01-02: qty 1

## 2023-01-02 NOTE — ED Notes (Signed)
Patient placed on 2LNC at this time d/t desaturations following pain med administration. RN aware.

## 2023-01-02 NOTE — ED Notes (Signed)
Patient desaturating while sleeping. RT questioned patient on history of sleep apnea. No confirmed diagnosis, but patient states that it was probable. Patient placed on [facility] at this time.

## 2023-01-02 NOTE — ED Triage Notes (Signed)
Pt states PCP sent for CT scan and was dx with diverticulitis  Stats abd pain x 2 weeks

## 2023-01-02 NOTE — ED Provider Notes (Signed)
[place] EMERGENCY DEPARTMENT AT [facility] Provider Note   CSN: [id number] Arrival date & time: 01/02/23  1712     History  Chief Complaint  Patient presents with   Abdominal Pain    Kristina Rivera is a 62 y.o. female with a history of right-sided breast cancer, sickle cell trait, and pulmonary hypertension presents the ED today with abdominal pain.  Patient has been having left lower quadrant pain and soft stools for the past several weeks.  Denies change to the color of her stools or dysuria.  No fever, nausea, or vomiting. Patient reports she was evaluated at her primary care office earlier today and was sent here for further evaluation and imaging for possible diverticulitis.  Denies any other concerns or complaints at this time.    Home Medications Prior to Admission medications   Medication Sig Start Date End Date Taking? Authorizing Provider  amoxicillin-clavulanate (AUGMENTIN) 875-125 MG tablet Take 1 tablet by mouth 2 (two) times daily for 7 days. 01/02/23 01/09/23 Yes Maxwell Marion, PA-C  acetaminophen (TYLENOL) 650 MG CR tablet Take 650 mg by mouth every 8 (eight) hours as needed for pain.     [provider]  albuterol (VENTOLIN HFA) 108 (90 BASE) MCG/ACT inhaler Inhale 2 puffs into the lungs as needed. Patient taking differently: Inhale 2 puffs into the lungs every 6 (six) hours as needed for wheezing or shortness of breath.  11/23/12   Piloto de Criselda Peaches, Dayarmys, MD  fluticasone [facility]) 50 MCG/ACT nasal spray Place 1 spray into both nostrils daily as needed for allergies or rhinitis.    [provider]  HYDROcodone-acetaminophen (NORCO/VICODIN) 5-325 MG tablet Take 1 tablet by mouth every 6 (six) hours as needed for severe pain. 11/10/20   Pollyann Savoy, MD  letrozole [facility]) 2.5 MG tablet Take 1 tablet (2.5 mg total) by mouth daily. Patient not taking: Reported on 09/18/2019 08/13/19   Magrinat, Valentino Hue, MD  Multiple  Vitamins-Minerals (EMERGEN-C IMMUNE PLUS) PACK Take 1 Package by mouth daily.    [provider]  OVER THE COUNTER MEDICATION Take 1 capsule by mouth daily. Chaga Mushroom Patient not taking: Reported on 09/18/2019    [provider]  OVER THE COUNTER MEDICATION Take 1 capsule by mouth daily. Gano Mushroom Patient not taking: Reported on 09/18/2019    [provider]      Allergies    Aspirin, Gadolinium derivatives, Nsaids, and Other    Review of Systems   Review of Systems  Gastrointestinal:  Positive for abdominal pain.  All other systems reviewed and are negative.   Physical Exam Updated Vital Signs BP 135/89   Pulse 67   Temp 99.1 F (37.3 C)   Resp 16   Ht 5\' 2"  (1.575 m)   Wt (!) 140.7 kg   SpO2 100%   BMI 56.73 kg/m  Physical Exam Vitals and nursing note reviewed.  Constitutional:      General: She is not in acute distress.    Appearance: Normal appearance.  HENT:     Head: Normocephalic and atraumatic.     Mouth/Throat:     Mouth: Mucous membranes are moist.  Eyes:     Conjunctiva/sclera: Conjunctivae normal.     Pupils: Pupils are equal, round, and reactive to light.  Cardiovascular:     Rate and Rhythm: Normal rate and regular rhythm.     Pulses: Normal pulses.     Heart sounds: Normal heart sounds.  Pulmonary:  Effort: Pulmonary effort is normal.     Breath sounds: Normal breath sounds.  Abdominal:     General: There is no distension.     Palpations: Abdomen is soft.     Tenderness: There is abdominal tenderness.     Comments: LLQ tenderness to palpation without rebound or guarding.  Musculoskeletal:     Right lower leg: No edema.     Left lower leg: No edema.  Skin:    General: Skin is warm and dry.     Findings: No rash.  Neurological:     General: No focal deficit present.     Mental Status: She is alert.     Sensory: No sensory deficit.     Motor: No weakness.  Psychiatric:        Mood and Affect: Mood normal.         Behavior: Behavior normal.     ED Results / Procedures / Treatments   Labs (all labs ordered are listed, but only abnormal results are displayed) Labs Reviewed  COMPREHENSIVE METABOLIC PANEL - Abnormal; Notable for the following components:      Result Value   CO2 35 (*)    Total Protein 8.4 (*)    Anion gap 4 (*)    All other components within normal limits  CBC - Abnormal; Notable for the following components:   WBC 12.6 (*)    RBC 5.70 (*)    MCV 72.5 (*)    MCH 21.8 (*)    All other components within normal limits  URINALYSIS, ROUTINE W REFLEX MICROSCOPIC - Abnormal; Notable for the following components:   Specific Gravity, Urine >1.046 (*)    Protein, ur TRACE (*)    All other components within normal limits  LIPASE, BLOOD    EKG None  Radiology CT ABDOMEN PELVIS W CONTRAST  Result Date: 01/02/2023 CLINICAL DATA:  LLQ abdominal pain EXAM: CT ABDOMEN AND PELVIS WITH CONTRAST TECHNIQUE: Multidetector CT imaging of the abdomen and pelvis was performed using the standard protocol following bolus administration of intravenous contrast. RADIATION DOSE REDUCTION: This exam was performed according to the departmental dose-optimization program which includes automated exposure control, adjustment of the mA and/or kV according to patient size and/or use of iterative reconstruction technique. CONTRAST:  OMNIPAQUE IOHEXOL 300 MG/ML  SOLN COMPARISON:  None Available. FINDINGS: Lower chest: No acute abnormality. Hepatobiliary: No focal liver abnormality. Calcified gallstones noted within the gallbladder lumen. No gallbladder wall thickening or pericholecystic fluid. No biliary dilatation. Pancreas: No focal lesion. Normal pancreatic contour. No surrounding inflammatory changes. No main pancreatic ductal dilatation. Spleen: Normal in size without focal abnormality. Adrenals/Urinary Tract: No adrenal nodule bilaterally. Bilateral kidneys enhance symmetrically. No hydronephrosis. No  hydroureter. The urinary bladder is unremarkable. On delayed imaging, there is no urothelial wall thickening and there are no filling defects in the opacified portions of the bilateral collecting systems or ureters. Stomach/Bowel: Stomach is within normal limits. No evidence of small bowel wall thickening or dilatation. Colonic diverticulosis. Short segment distal descending/proximal sigmoid colon bowel wall thickening and pericolonic fat stranding. Appendix appears normal. Vascular/Lymphatic: No abdominal aorta or iliac aneurysm. No abdominal, pelvic, or inguinal lymphadenopathy. Reproductive: Intramural uterine fibroid. Otherwise uterus and bilateral adnexa are unremarkable. Other: No intraperitoneal free fluid. No intraperitoneal free gas. No organized fluid collection. Musculoskeletal: No abdominal wall hernia or abnormality. No suspicious lytic or blastic osseous lesions. No acute displaced fracture. Multilevel degenerative changes of the spine. IMPRESSION: 1. Colonic diverticulosis with uncomplicated distal  descending/proximal sigmoid acute diverticulitis. Recommend colonoscopy status post treatment and status post complete resolution of inflammatory changes to exclude an underlying lesion. 2. Uterine fibroid. Electronically Signed   By: Tish Frederickson M.D.   On: 01/02/2023 22:10    Procedures Procedures: not indicated.   Medications Ordered in ED Medications  morphine (PF) 4 MG/ML injection 4 mg (4 mg Intravenous Given 01/02/23 1906)  ondansetron (ZOFRAN) injection 4 mg (4 mg Intravenous Given 01/02/23 1904)  iohexol (OMNIPAQUE) 300 MG/ML solution 100 mL (100 mLs Intravenous Contrast Given 01/02/23 1917)  amoxicillin-clavulanate (AUGMENTIN) 875-125 MG per tablet 1 tablet (1 tablet Oral Given 01/02/23 2316)  morphine (PF) 4 MG/ML injection 4 mg (4 mg Intravenous Given 01/02/23 2317)    ED Course/ Medical Decision Making/ A&P                                 Medical Decision Making Amount  and/or Complexity of Data Reviewed Labs: ordered. Radiology: ordered.  Risk Prescription drug management.   This patient presents to the ED for concern of abdominal pain, this involves an extensive number of treatment options, and is a complaint that carries with it a high risk of complications and morbidity.   Differential diagnosis includes: IBD, IBS, gastroenteritis, diverticulitis, constipation, etc.   Comorbidities  See HPI above   Additional History  Additional history obtained from PCP note.   Lab Tests  I ordered and personally interpreted labs.  The pertinent results include:   CBC shows elevated white count of 12.6, otherwise CBC and CMP are within normal limits. Lipase is unremarkable UA is within normal limits - no signs of acute infection.   Imaging Studies  I ordered imaging studies including CT abdomen/pelvis  I independently visualized and interpreted imaging which showed: colonic diverticulosis with uncomplicated distal descending/proximal sigmoid acute diverticulitis. Recommend colonoscopy s/p treatment and complete resolution of inflammatory changes to exclude underlying lesion. Uterine fibroid. I agree with the radiologist interpretation   Problem List / ED Course / Critical Interventions / Medication Management  Abdominal pain I ordered medications including: Morphine and Zofran for abdominal pain First dose Augmentin given in the ED tonight.  Reevaluation of the patient after these medicines showed that the patient improved I have reviewed the patients home medicines and have made adjustments as needed   Social Determinants of Health  Access to healthcare.   Test / Admission - Considered  Discussed findings with patient. She is hemodynamically stable and safe for discharge home. Prescription for Augmentin sent to the pharmacy. Return precautions provided.       Final Clinical Impression(s) / ED Diagnoses Final diagnoses:   Diverticulitis    Rx / DC Orders ED Discharge Orders          Ordered    amoxicillin-clavulanate (AUGMENTIN) 875-125 MG tablet  2 times daily        01/02/23 2321              Maxwell Marion, PA-C 01/02/23 2349    Anders Simmonds T, DO 01/03/23 1644

## 2023-01-02 NOTE — Discharge Instructions (Addendum)
As discussed, your imaging showed diverticulitis. Radiology recommends colonoscopy after treatment and resolution of infection to rule out underlying lesion.  Take Augmentin twice a day for the next 7 days for diverticulitis.  Follow up with your PCP in 1 week for reevaluation of symptoms.

## 2023-01-03 MED ORDER — HYDROCODONE-ACETAMINOPHEN 5-325 MG PO TABS: 1.0000 | ORAL_TABLET | Freq: Four times a day (QID) | ORAL | 0 refills | Status: AC | PRN

## 2023-01-05 ENCOUNTER — Other Ambulatory Visit: Payer: Self-pay

## 2023-03-07 DIAGNOSIS — C50911 Malignant neoplasm of unspecified site of right female breast: Secondary | ICD-10-CM | POA: Diagnosis not present

## 2023-03-10 DIAGNOSIS — H524 Presbyopia: Secondary | ICD-10-CM | POA: Diagnosis not present

## 2023-03-10 DIAGNOSIS — Z135 Encounter for screening for eye and ear disorders: Secondary | ICD-10-CM | POA: Diagnosis not present

## 2023-03-10 DIAGNOSIS — H52223 Regular astigmatism, bilateral: Secondary | ICD-10-CM | POA: Diagnosis not present

## 2023-03-10 DIAGNOSIS — H5203 Hypermetropia, bilateral: Secondary | ICD-10-CM | POA: Diagnosis not present

## 2023-03-10 DIAGNOSIS — H04123 Dry eye syndrome of bilateral lacrimal glands: Secondary | ICD-10-CM | POA: Diagnosis not present

## 2023-03-10 DIAGNOSIS — H2513 Age-related nuclear cataract, bilateral: Secondary | ICD-10-CM | POA: Diagnosis not present

## 2023-03-16 DIAGNOSIS — C50911 Malignant neoplasm of unspecified site of right female breast: Secondary | ICD-10-CM | POA: Diagnosis not present

## 2023-03-28 ENCOUNTER — Other Ambulatory Visit: Payer: Self-pay | Admitting: General Surgery

## 2023-03-28 DIAGNOSIS — Z1231 Encounter for screening mammogram for malignant neoplasm of breast: Secondary | ICD-10-CM

## 2023-03-29 ENCOUNTER — Other Ambulatory Visit: Payer: Self-pay

## 2023-04-12 ENCOUNTER — Ambulatory Visit
Admission: RE | Admit: 2023-04-12 | Discharge: 2023-04-12 | Disposition: A | Discharge status: Home / Self Care | Payer: Medicare HMO | Source: Ambulatory Visit | Attending: General Surgery | Admitting: General Surgery

## 2023-04-12 DIAGNOSIS — Z1231 Encounter for screening mammogram for malignant neoplasm of breast: Secondary | ICD-10-CM

## 2023-04-14 ENCOUNTER — Other Ambulatory Visit: Payer: Self-pay | Admitting: General Surgery

## 2023-04-14 DIAGNOSIS — I10 Essential (primary) hypertension: Secondary | ICD-10-CM | POA: Diagnosis not present

## 2023-04-14 DIAGNOSIS — R921 Mammographic calcification found on diagnostic imaging of breast: Secondary | ICD-10-CM

## 2023-04-14 DIAGNOSIS — Z79899 Other long term (current) drug therapy: Secondary | ICD-10-CM | POA: Diagnosis not present

## 2023-04-14 DIAGNOSIS — C50911 Malignant neoplasm of unspecified site of right female breast: Secondary | ICD-10-CM | POA: Diagnosis not present

## 2023-04-14 DIAGNOSIS — T66XXXS Radiation sickness, unspecified, sequela: Secondary | ICD-10-CM | POA: Diagnosis not present

## 2023-04-14 DIAGNOSIS — E559 Vitamin D deficiency, unspecified: Secondary | ICD-10-CM | POA: Diagnosis not present

## 2023-04-14 DIAGNOSIS — F330 Major depressive disorder, recurrent, mild: Secondary | ICD-10-CM | POA: Diagnosis not present

## 2023-04-14 DIAGNOSIS — E1169 Type 2 diabetes mellitus with other specified complication: Secondary | ICD-10-CM | POA: Diagnosis not present

## 2023-04-14 DIAGNOSIS — Z0000 Encounter for general adult medical examination without abnormal findings: Secondary | ICD-10-CM | POA: Diagnosis not present

## 2023-04-22 ENCOUNTER — Other Ambulatory Visit: Payer: Self-pay

## 2023-04-28 DIAGNOSIS — R059 Cough, unspecified: Secondary | ICD-10-CM | POA: Diagnosis not present

## 2023-04-28 DIAGNOSIS — J45909 Unspecified asthma, uncomplicated: Secondary | ICD-10-CM | POA: Diagnosis not present

## 2023-04-28 DIAGNOSIS — Z03818 Encounter for observation for suspected exposure to other biological agents ruled out: Secondary | ICD-10-CM | POA: Diagnosis not present

## 2023-04-28 DIAGNOSIS — J988 Other specified respiratory disorders: Secondary | ICD-10-CM | POA: Diagnosis not present

## 2023-06-06 ENCOUNTER — Other Ambulatory Visit: Payer: Self-pay | Admitting: General Surgery

## 2023-06-06 DIAGNOSIS — Z853 Personal history of malignant neoplasm of breast: Secondary | ICD-10-CM

## 2023-06-12 ENCOUNTER — Other Ambulatory Visit: Payer: Self-pay | Admitting: General Surgery

## 2023-06-12 DIAGNOSIS — Z170 Estrogen receptor positive status [ER+]: Secondary | ICD-10-CM

## 2023-06-12 DIAGNOSIS — C50111 Malignant neoplasm of central portion of right female breast: Secondary | ICD-10-CM

## 2023-06-14 ENCOUNTER — Ambulatory Visit
Admission: RE | Admit: 2023-06-14 | Discharge: 2023-06-14 | Disposition: A | Discharge status: Home / Self Care | Source: Ambulatory Visit | Attending: General Surgery | Admitting: General Surgery

## 2023-06-14 DIAGNOSIS — C50111 Malignant neoplasm of central portion of right female breast: Secondary | ICD-10-CM

## 2023-06-14 DIAGNOSIS — R590 Localized enlarged lymph nodes: Secondary | ICD-10-CM | POA: Diagnosis not present

## 2023-06-14 DIAGNOSIS — Z170 Estrogen receptor positive status [ER+]: Secondary | ICD-10-CM

## 2023-06-19 ENCOUNTER — Other Ambulatory Visit: Payer: Self-pay | Admitting: General Surgery

## 2023-06-19 DIAGNOSIS — R599 Enlarged lymph nodes, unspecified: Secondary | ICD-10-CM

## 2023-06-20 ENCOUNTER — Other Ambulatory Visit: Payer: Self-pay

## 2023-06-23 ENCOUNTER — Other Ambulatory Visit: Payer: Self-pay | Admitting: General Surgery

## 2023-06-23 ENCOUNTER — Ambulatory Visit
Admission: RE | Admit: 2023-06-23 | Discharge: 2023-06-23 | Disposition: A | Discharge status: Home / Self Care | Source: Ambulatory Visit | Attending: General Surgery | Admitting: General Surgery

## 2023-06-23 DIAGNOSIS — R921 Mammographic calcification found on diagnostic imaging of breast: Secondary | ICD-10-CM

## 2023-06-23 DIAGNOSIS — R928 Other abnormal and inconclusive findings on diagnostic imaging of breast: Secondary | ICD-10-CM | POA: Diagnosis not present

## 2023-06-23 DIAGNOSIS — R599 Enlarged lymph nodes, unspecified: Secondary | ICD-10-CM

## 2023-06-23 DIAGNOSIS — D0511 Intraductal carcinoma in situ of right breast: Secondary | ICD-10-CM | POA: Diagnosis not present

## 2023-06-23 DIAGNOSIS — N6331 Unspecified lump in axillary tail of the right breast: Secondary | ICD-10-CM | POA: Diagnosis not present

## 2023-06-23 DIAGNOSIS — R590 Localized enlarged lymph nodes: Secondary | ICD-10-CM | POA: Diagnosis not present

## 2023-06-23 MED ORDER — IOPAMIDOL (ISOVUE-370) INJECTION 76%
100.0000 mL | Freq: Once | INTRAVENOUS | Status: AC | PRN
  Administered 2023-06-23: 100 mL via INTRAVENOUS

## 2023-06-26 ENCOUNTER — Encounter: Payer: Self-pay | Admitting: General Surgery

## 2023-06-26 ENCOUNTER — Other Ambulatory Visit: Payer: Self-pay | Admitting: General Surgery

## 2023-06-26 DIAGNOSIS — Z853 Personal history of malignant neoplasm of breast: Secondary | ICD-10-CM

## 2023-06-27 ENCOUNTER — Other Ambulatory Visit: Payer: Self-pay

## 2023-07-10 ENCOUNTER — Other Ambulatory Visit: Payer: Self-pay | Admitting: General Surgery

## 2023-07-10 DIAGNOSIS — Z853 Personal history of malignant neoplasm of breast: Secondary | ICD-10-CM

## 2023-10-12 DIAGNOSIS — C50911 Malignant neoplasm of unspecified site of right female breast: Secondary | ICD-10-CM | POA: Diagnosis not present

## 2023-10-12 DIAGNOSIS — Z79899 Other long term (current) drug therapy: Secondary | ICD-10-CM | POA: Diagnosis not present

## 2023-10-12 DIAGNOSIS — E1169 Type 2 diabetes mellitus with other specified complication: Secondary | ICD-10-CM | POA: Diagnosis not present

## 2023-12-16 ENCOUNTER — Other Ambulatory Visit: Payer: Self-pay

## 2023-12-16 ENCOUNTER — Emergency Department (HOSPITAL_COMMUNITY): Admission: EM | Admit: 2023-12-16 | Discharge: 2023-12-16 | Disposition: A | Discharge status: Home / Self Care

## 2023-12-16 ENCOUNTER — Encounter (HOSPITAL_COMMUNITY): Payer: Self-pay | Admitting: Emergency Medicine

## 2023-12-16 ENCOUNTER — Emergency Department (HOSPITAL_COMMUNITY)

## 2023-12-16 DIAGNOSIS — I517 Cardiomegaly: Secondary | ICD-10-CM

## 2023-12-16 DIAGNOSIS — R42 Dizziness and giddiness: Secondary | ICD-10-CM | POA: Insufficient documentation

## 2023-12-16 DIAGNOSIS — J45909 Unspecified asthma, uncomplicated: Secondary | ICD-10-CM | POA: Insufficient documentation

## 2023-12-16 DIAGNOSIS — Z7951 Long term (current) use of inhaled steroids: Secondary | ICD-10-CM | POA: Diagnosis not present

## 2023-12-16 DIAGNOSIS — I10 Essential (primary) hypertension: Secondary | ICD-10-CM

## 2023-12-16 DIAGNOSIS — R519 Headache, unspecified: Secondary | ICD-10-CM | POA: Diagnosis not present

## 2023-12-16 LAB — URINALYSIS, ROUTINE W REFLEX MICROSCOPIC
Bilirubin Urine: NEGATIVE
Glucose, UA: NEGATIVE mg/dL
Hgb urine dipstick: NEGATIVE
Ketones, ur: NEGATIVE mg/dL
Leukocytes,Ua: NEGATIVE
Nitrite: NEGATIVE
Protein, ur: NEGATIVE mg/dL
Specific Gravity, Urine: 1.014 (ref 1.005–1.030)
pH: 6 (ref 5.0–8.0)

## 2023-12-16 LAB — COMPREHENSIVE METABOLIC PANEL WITH GFR
ALT: 15 U/L (ref 0–44)
AST: 32 U/L (ref 15–41)
Albumin: 3.7 g/dL (ref 3.5–5.0)
Alkaline Phosphatase: 62 U/L (ref 38–126)
Anion gap: 11 (ref 5–15)
BUN: 12 mg/dL (ref 8–23)
CO2: 28 mmol/L (ref 22–32)
Calcium: 9.4 mg/dL (ref 8.9–10.3)
Chloride: 101 mmol/L (ref 98–111)
Creatinine, Ser: 0.86 mg/dL (ref 0.44–1.00)
GFR, Estimated: >60 mL/min (ref >60)
Glucose, Bld: 185 mg/dL — ABNORMAL HIGH (ref 70–99)
Potassium: 4.5 mmol/L (ref 3.5–5.1)
Sodium: 140 mmol/L (ref 135–145)
Total Bilirubin: 0.5 mg/dL (ref 0.0–1.2)
Total Protein: 7.3 g/dL (ref 6.5–8.1)

## 2023-12-16 LAB — TROPONIN T, HIGH SENSITIVITY: Troponin T High Sensitivity: <15 ng/L (ref 0–19)

## 2023-12-16 LAB — CBC
HCT: 42.1 % (ref 36.0–46.0)
Hemoglobin: 12.2 g/dL (ref 12.0–15.0)
MCH: 21.5 pg — ABNORMAL LOW (ref 26.0–34.0)
MCHC: 29 g/dL — ABNORMAL LOW (ref 30.0–36.0)
MCV: 74.3 fL — ABNORMAL LOW (ref 80.0–100.0)
Platelets: 257 K/uL (ref 150–400)
RBC: 5.67 MIL/uL — ABNORMAL HIGH (ref 3.87–5.11)
RDW: 15.9 % — ABNORMAL HIGH (ref 11.5–15.5)
WBC: 7.5 K/uL (ref 4.0–10.5)
nRBC: 0 % (ref 0.0–0.2)

## 2023-12-16 LAB — CBG MONITORING, ED: Glucose-Capillary: 188 mg/dL — ABNORMAL HIGH (ref 70–99)

## 2023-12-16 MED ORDER — MORPHINE SULFATE 15 MG PO TABS
15.0000 mg | ORAL_TABLET | ORAL | Status: DC | PRN
  Administered 2023-12-16: 15 mg via ORAL
  Filled 2023-12-16: qty 1

## 2023-12-16 MED ORDER — DIPHENHYDRAMINE HCL 25 MG PO CAPS
25.0000 mg | ORAL_CAPSULE | Freq: Once | ORAL | Status: AC
Start: 2023-12-16 — End: 2023-12-16
  Administered 2023-12-16: 25 mg via ORAL
  Filled 2023-12-16: qty 1

## 2023-12-16 MED ORDER — METOCLOPRAMIDE HCL 5 MG/ML IJ SOLN
10.0000 mg | Freq: Once | INTRAMUSCULAR | Status: DC
  Filled 2023-12-16: qty 2

## 2023-12-16 MED ORDER — LACTATED RINGERS IV BOLUS
500.0000 mL | Freq: Once | INTRAVENOUS | Status: AC
  Administered 2023-12-16: 500 mL via INTRAVENOUS

## 2023-12-16 NOTE — ED Provider Notes (Signed)
 63 yo female with HTN, headache and dizziness for the past week. Patient states that she tripped and fell about a week ago, hit her head with brief LOC.   Physical Exam  BP 131/72   Pulse 82   Temp (!) 97 F (36.1 C) (Oral)   Resp 18   Ht 5' 2 (1.575 m)   Wt (!) 138.3 kg   SpO2 99%   BMI 55.79 kg/m   Physical Exam  Procedures  Procedures  ED Course / MDM   Clinical Course as of 12/16/23 1641  Sat Dec 16, 2023  1437 Right shoulder with osteoarthritis [WF]  1437 DG Chest 2 View Mild cardiomegaly no infiltrate or pulmonary edema [WF]  1505 CT Head Wo Contrast Unremarkable [WF]    Clinical Course User Index [WF] Neldon Hamp RAMAN, PA   Medical Decision Making Amount and/or Complexity of Data Reviewed Labs: ordered. Radiology: ordered. Decision-making details documented in ED Course.  Risk Prescription drug management.  CT L-spine without acute findings. Headache resolved, reports feeling tired. Sees [facility], plan is to monitor BP at home daily, record results and take to PCP.        Beverley Leita LABOR, PA-C 12/16/23 1641    Patt Alm Macho, MD 12/16/23 [phone]

## 2023-12-16 NOTE — Discharge Instructions (Addendum)
 Your workup today is reassuringly normal.  Follow-up with your primary care doctor.  I am glad your headache has resolved.

## 2023-12-16 NOTE — ED Triage Notes (Addendum)
 Pt comes in reporting severe headache, circumferential, since fall about a week ago where she reports losing consciousness for several minutes.  Pt has no nausea/vomiting.  Pt reports pain down her entire right side since the fall.  Pt endorses dizziness with this current headache.  Pt also reports some CP but states she has that frequently when around cell towers or cell phones.

## 2023-12-16 NOTE — ED Provider Notes (Signed)
 [place] EMERGENCY DEPARTMENT AT [facility] Provider Note   CSN: [id number] Arrival date & time: 12/16/23  1159     Patient presents with: Dizziness   Kristina Rivera is a 63 y.o. female.    Dizziness  Patient is a 63 year old female with past medical history significant for daily headaches hemarthrosis, asthma, anxiety, depression, palpitations  She presents emergency room today with complaints of some left-sided headaches and dizziness states that she felt some discomfort in her chest and states that this only occurs when she is around cell phone towers or cell phones.  She denies any exertional chest pain or shortness of breath.  No other associate symptoms.  No blurry vision no numbness or weakness in either upper or lower extremity.  No fevers or chills no back pain or neck pain      Prior to Admission medications   Medication Sig Start Date End Date Taking? Authorizing Provider  acetaminophen  (TYLENOL ) 650 MG CR tablet Take 650 mg by mouth every 8 (eight) hours as needed for pain.     [provider]  albuterol  (VENTOLIN  HFA) 108 (90 BASE) MCG/ACT inhaler Inhale 2 puffs into the lungs as needed. Patient taking differently: Inhale 2 puffs into the lungs every 6 (six) hours as needed for wheezing or shortness of breath.  11/23/12   Piloto de Clint Mech, Dayarmys, MD  fluticasone (FLONASE) 50 MCG/ACT nasal spray Place 1 spray into both nostrils daily as needed for allergies or rhinitis.    [provider]  letrozole  (FEMARA ) 2.5 MG tablet Take 1 tablet (2.5 mg total) by mouth daily. Patient not taking: Reported on 09/18/2019 08/13/19   Magrinat, Sandria BROCKS, MD  Multiple Vitamins-Minerals (EMERGEN-C IMMUNE PLUS) PACK Take 1 Package by mouth daily.    [provider]  OVER THE COUNTER MEDICATION Take 1 capsule by mouth daily. Chaga Mushroom Patient not taking: Reported on 09/18/2019    [provider]  OVER THE COUNTER MEDICATION Take 1  capsule by mouth daily. Gano Mushroom Patient not taking: Reported on 09/18/2019    [provider]    Allergies: Aspirin, Gadolinium derivatives, Nsaids, and Other    Review of Systems  Neurological:  Positive for dizziness.    Updated Vital Signs BP (!) 144/85 (BP Location: Left Arm)   Pulse 82   Temp (!) 97 F (36.1 C) (Oral)   Resp 18   Ht 5' 2 (1.575 m)   Wt (!) 138.3 kg   SpO2 99%   BMI 55.79 kg/m   Physical Exam Vitals and nursing note reviewed.  Constitutional:      General: She is not in acute distress.    Comments: Morbidly obese 63 year old female in no acute distress able to answer questions appropriately follow commands.  HENT:     Head: Normocephalic and atraumatic.     Nose: Nose normal.     Mouth/Throat:     Mouth: Mucous membranes are dry.  Eyes:     General: No scleral icterus. Cardiovascular:     Rate and Rhythm: Normal rate and regular rhythm.     Pulses: Normal pulses.     Heart sounds: Normal heart sounds.  Pulmonary:     Effort: Pulmonary effort is normal. No respiratory distress.     Breath sounds: No wheezing.  Abdominal:     Palpations: Abdomen is soft.     Tenderness: There is no abdominal tenderness. There is no guarding or rebound.  Musculoskeletal:  Cervical back: Normal range of motion.     Right lower leg: No edema.     Left lower leg: No edema.  Skin:    General: Skin is warm and dry.     Capillary Refill: Capillary refill takes less than 2 seconds.  Neurological:     Mental Status: She is alert. Mental status is at baseline.     Comments: Alert and oriented to self, place, time and event.   Speech is fluent, clear without dysarthria or dysphasia.   Strength 5/5 in upper/lower extremities   Sensation intact in upper/lower extremities   CN I not tested  CN II grossly intact visual fields bilaterally. Did not visualize posterior eye.  CN III, IV, VI PERRLA and EOMs intact bilaterally  CN V Intact sensation to  sharp and light touch to the face  CN VII facial movements symmetric  CN VIII not tested  CN IX, X no uvula deviation, symmetric rise of soft palate  CN XI 5/5 SCM and trapezius strength bilaterally  CN XII Midline tongue protrusion, symmetric L/R movements    Psychiatric:        Mood and Affect: Mood normal.        Behavior: Behavior normal.     (all labs ordered are listed, but only abnormal results are displayed) Labs Reviewed  COMPREHENSIVE METABOLIC PANEL WITH GFR - Abnormal; Notable for the following components:      Result Value   Glucose, Bld 185 (*)    All other components within normal limits  CBC - Abnormal; Notable for the following components:   RBC 5.67 (*)    MCV 74.3 (*)    MCH 21.5 (*)    MCHC 29.0 (*)    RDW 15.9 (*)    All other components within normal limits  CBG MONITORING, ED - Abnormal; Notable for the following components:   Glucose-Capillary 188 (*)    All other components within normal limits  URINALYSIS, ROUTINE W REFLEX MICROSCOPIC  TROPONIN T, HIGH SENSITIVITY    EKG: None  Radiology: CT Head Wo Contrast Result Date: 12/16/2023 CLINICAL DATA:  Provided history: Head trauma, moderate-severe head injury, LOC 1 week ago headaches since EXAM: CT HEAD WITHOUT CONTRAST TECHNIQUE: Contiguous axial images were obtained from the base of the skull through the vertex without intravenous contrast. RADIATION DOSE REDUCTION: This exam was performed according to the departmental dose-optimization program which includes automated exposure control, adjustment of the mA and/or kV according to patient size and/or use of iterative reconstruction technique. COMPARISON:  None Available. FINDINGS: Brain: No intracranial hemorrhage, mass effect, or midline shift. No hydrocephalus. The basilar cisterns are patent. No evidence of territorial infarct or acute ischemia. No extra-axial or intracranial fluid collection. Vascular: Atherosclerosis of skullbase vasculature without  hyperdense vessel or abnormal calcification. Skull: No fracture or focal lesion. Sinuses/Orbits: No acute finding. Other: No confluent scalp hematoma IMPRESSION: No acute intracranial abnormality. No skull fracture. Electronically Signed   By: Andrea Gasman M.D.   On: 12/16/2023 14:56   DG Shoulder Right Result Date: 12/16/2023 CLINICAL DATA:  CP - fall 1 week ago. +R shoulder pain EXAM: RIGHT SHOULDER - 2+ VIEW COMPARISON:  12/17/2008 FINDINGS: No acute fracture or dislocation. Mild joint space loss of the [facility] joint with undersurface spurring, unchanged. Soft tissues are unremarkable. IMPRESSION: 1. No acute fracture or dislocation. 2. Mild osteoarthritis of the AC joint. Electronically Signed   By: Rogelia Myers M.D.   On: 12/16/2023 14:14   DG  Chest 2 View Result Date: 12/16/2023 CLINICAL DATA:  CP - fall 1 week ago. +R shoulder pain EXAM: CHEST - 2 VIEW COMPARISON:  03/16/2019 FINDINGS: No focal airspace consolidation, pleural effusion, or pneumothorax. Mild cardiomegaly. Tortuous aorta with aortic atherosclerosis. No acute fracture or destructive lesions. Multilevel thoracic osteophytosis. IMPRESSION: Mild cardiomegaly. No pneumonia or pulmonary edema. Electronically Signed   By: Rogelia Myers M.D.   On: 12/16/2023 14:09     Procedures   Medications Ordered in the ED  morphine  (MSIR) tablet 15 mg (15 mg Oral Given 12/16/23 1351)  metoCLOPramide  (REGLAN ) injection 10 mg (10 mg Intravenous Patient Refused/Not Given 12/16/23 1351)  diphenhydrAMINE  (BENADRYL ) capsule 25 mg (25 mg Oral Given 12/16/23 1351)  lactated ringers  bolus 500 mL (500 mLs Intravenous New Bag/Given 12/16/23 1349)    Clinical Course as of 12/16/23 1507  Sat Dec 16, 2023  1437 Right shoulder with osteoarthritis [WF]  1437 DG Chest 2 View Mild cardiomegaly no infiltrate or pulmonary edema [WF]  1505 CT Head Wo Contrast Unremarkable [WF]    Clinical Course User Index [WF] Neldon Hamp RAMAN, PA                                  Medical Decision Making Amount and/or Complexity of Data Reviewed Labs: ordered. Radiology: ordered.  Risk Prescription drug management.   This patient presents to the ED for concern of headache, this involves a number of treatment options, and is a complaint that carries with it a high risk of complications and morbidity. A differential diagnosis was considered for the patient's symptoms which is discussed below:   Emergent considerations for headache include subarachnoid hemorrhage, meningitis, temporal arteritis, glaucoma, cerebral ischemia, carotid/vertebral dissection, intracranial tumor, Venous sinus thrombosis, carbon monoxide poisoning, acute or chronic subdural hemorrhage.  Other considerations include: Migraine, Cluster headache, Hypertension, Caffeine, alcohol, or drug withdrawal, Pseudotumor cerebri, Arteriovenous malformation, Head injury, Neurocysticercosis, Post-lumbar puncture, Preeclampsia, Tension headache, Sinusitis, Cervical arthritis, Refractive error causing strain, Dental abscess, Otitis media, Temporomandibular joint syndrome, Depression, Somatoform disorder (eg, somatization) Trigeminal neuralgia, Glossopharyngeal neuralgia.    Co morbidities: Discussed in HPI   Brief History:  Patient is a 63 year old female with past medical history significant for daily headaches hemarthrosis, asthma, anxiety, depression, palpitations  She presents emergency room today with complaints of some left-sided headaches and dizziness states that she felt some discomfort in her chest and states that this only occurs when she is around cell phone towers or cell phones.  She denies any exertional chest pain or shortness of breath.  No other associate symptoms.  No blurry vision no numbness or weakness in either upper or lower extremity.  No fevers or chills no back pain or neck pain    EMR reviewed including pt PMHx, past surgical history and past visits to ER.   See HPI for  more details   Lab Tests:   I personally reviewed all laboratory work and imaging. Metabolic panel without any acute abnormality specifically kidney function within normal limits and no significant electrolyte abnormalities. CBC without leukocytosis or significant anemia.   Imaging Studies:  NAD. I personally reviewed all imaging studies and no acute abnormality found. I agree with radiology interpretation.  Chest x-ray with mild cardiomegaly otherwise unremarkable  Cardiac Monitoring:  The patient was maintained on a cardiac monitor.  I personally viewed and interpreted the cardiac monitored which showed an underlying rhythm of: NSR EKG non-ischemic  Medicines ordered:  I ordered medication including lactated Ringer 's 500, Benadryl , morphine , Reglan  for headache Reevaluation of the patient after these medicines showed that the patient resolved I have reviewed the patients home medicines and have made adjustments as needed   Critical Interventions:     Consults/Attending Physician      Reevaluation:  After the interventions noted above I re-evaluated patient and found that they have :resolved   Social Determinants of Health:      Problem List / ED Course:  Patient here with elevated blood pressure headache and some dizziness.  All of her symptoms resolved with treatment of her headache with Reglan  Benadryl  Msir and small amount of IV fluids.  Troponin undetectable reassuring imaging slight cardiomegaly she will need to follow-up with primary care regarding this.  Patient care handed off to Leita Chancy at shift change for follow-up on CT L-spine which is still pending radiology read.   Dispostion:    Final diagnoses:  None    ED Discharge Orders     None          Neldon Hamp RAMAN, [place] 12/16/23 1522    Simon Lavonia SAILOR, MD 12/16/23 1535

## 2023-12-18 ENCOUNTER — Other Ambulatory Visit: Payer: Self-pay
